# Patient Record
Sex: Male | Born: 1985 | Race: White | Hispanic: No | Marital: Married | Smoking: Former smoker
Health system: Southern US, Community
[De-identification: ages and names within clinical notes are randomized; demographics above are authoritative.]

## PROBLEM LIST (undated history)

## (undated) DIAGNOSIS — N400 Enlarged prostate without lower urinary tract symptoms: Secondary | ICD-10-CM

## (undated) DIAGNOSIS — G8929 Other chronic pain: Secondary | ICD-10-CM

## (undated) DIAGNOSIS — M51369 Other intervertebral disc degeneration, lumbar region without mention of lumbar back pain or lower extremity pain: Secondary | ICD-10-CM

## (undated) DIAGNOSIS — M1990 Unspecified osteoarthritis, unspecified site: Secondary | ICD-10-CM

## (undated) DIAGNOSIS — G709 Myoneural disorder, unspecified: Secondary | ICD-10-CM

## (undated) DIAGNOSIS — D869 Sarcoidosis, unspecified: Secondary | ICD-10-CM

## (undated) DIAGNOSIS — I10 Essential (primary) hypertension: Secondary | ICD-10-CM

## (undated) DIAGNOSIS — F419 Anxiety disorder, unspecified: Secondary | ICD-10-CM

## (undated) DIAGNOSIS — M4800 Spinal stenosis, site unspecified: Secondary | ICD-10-CM

## (undated) DIAGNOSIS — J189 Pneumonia, unspecified organism: Secondary | ICD-10-CM

## (undated) DIAGNOSIS — E785 Hyperlipidemia, unspecified: Secondary | ICD-10-CM

## (undated) DIAGNOSIS — F329 Major depressive disorder, single episode, unspecified: Secondary | ICD-10-CM

## (undated) DIAGNOSIS — M459 Ankylosing spondylitis of unspecified sites in spine: Secondary | ICD-10-CM

## (undated) DIAGNOSIS — F32A Depression, unspecified: Secondary | ICD-10-CM

## (undated) DIAGNOSIS — M5136 Other intervertebral disc degeneration, lumbar region: Secondary | ICD-10-CM

## (undated) DIAGNOSIS — M5030 Other cervical disc degeneration, unspecified cervical region: Secondary | ICD-10-CM

## (undated) DIAGNOSIS — G834 Cauda equina syndrome: Secondary | ICD-10-CM

## (undated) DIAGNOSIS — M961 Postlaminectomy syndrome, not elsewhere classified: Secondary | ICD-10-CM

## (undated) HISTORY — PX: SPINAL FUSION: SHX223

## (undated) HISTORY — DX: Myoneural disorder, unspecified: G70.9

## (undated) HISTORY — DX: Benign prostatic hyperplasia without lower urinary tract symptoms: N40.0

## (undated) HISTORY — PX: SPINE SURGERY: SHX786

## (undated) HISTORY — DX: Other intervertebral disc degeneration, lumbar region without mention of lumbar back pain or lower extremity pain: M51.369

## (undated) HISTORY — DX: Depression, unspecified: F32.A

## (undated) HISTORY — DX: Unspecified osteoarthritis, unspecified site: M19.90

## (undated) HISTORY — DX: Other cervical disc degeneration, unspecified cervical region: M50.30

## (undated) HISTORY — DX: Other intervertebral disc degeneration, lumbar region: M51.36

## (undated) HISTORY — DX: Essential (primary) hypertension: I10

## (undated) HISTORY — PX: IR FL GUIDED LOC OF NEEDLE/CATH TIP FOR SPINAL INJECTION LT: IMG2396

## (undated) HISTORY — DX: Other chronic pain: G89.29

## (undated) HISTORY — DX: Postlaminectomy syndrome, not elsewhere classified: M96.1

## (undated) HISTORY — DX: Sarcoidosis, unspecified: D86.9

## (undated) HISTORY — DX: Spinal stenosis, site unspecified: M48.00

## (undated) HISTORY — PX: CERVICAL FUSION: SHX112

## (undated) HISTORY — PX: SPINAL CORD STIMULATOR IMPLANT: SHX2422

## (undated) HISTORY — DX: Cauda equina syndrome: G83.4

## (undated) HISTORY — PX: LAMINECTOMY AND MICRODISCECTOMY LUMBAR SPINE: SHX1913

## (undated) HISTORY — DX: Hyperlipidemia, unspecified: E78.5

---
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
## 2016-10-07 DIAGNOSIS — M5020 Other cervical disc displacement, unspecified cervical region: Secondary | ICD-10-CM | POA: Insufficient documentation

## 2019-11-04 ENCOUNTER — Ambulatory Visit: Payer: Self-pay | Attending: Internal Medicine

## 2019-11-04 DIAGNOSIS — Z23 Encounter for immunization: Secondary | ICD-10-CM

## 2019-11-04 NOTE — Progress Notes (Signed)
   Covid-19 Vaccination Clinic  Name:  Lawrence Cantu    MRN: [id number] DOB: [birth year]  11/04/2019  [name] was observed post Covid-19 immunization for 15 minutes without incident. He was provided with Vaccine Information Sheet and instruction to access the V-Safe system.   [name] was instructed to call 911 with any severe reactions post vaccine: Marland Kitchen Difficulty breathing  . Swelling of face and throat  . A fast heartbeat  . A bad rash all over body  . Dizziness and weakness   Immunizations Administered    Name Date Dose VIS Date Route   Pfizer COVID-19 Vaccine 11/04/2019 12:53 PM 0.3 mL 09/01/2018 Intramuscular   Manufacturer: [employer], [employer]   Lot: [id number]   NDC: [address]-4

## 2019-11-29 ENCOUNTER — Ambulatory Visit: Payer: Medicare Other | Attending: Internal Medicine

## 2019-11-29 DIAGNOSIS — Z23 Encounter for immunization: Secondary | ICD-10-CM

## 2019-11-29 NOTE — Progress Notes (Signed)
   Covid-19 Vaccination Clinic  Name:  Lawrence Cantu    MRN: [id number] DOB: 1986-02-11  11/29/2019  Lawrence Cantu was observed post Covid-19 immunization for 15 minutes without incident. He was provided with Vaccine Information Sheet and instruction to access the V-Safe system.   Lawrence Cantu was instructed to call 911 with any severe reactions post vaccine: Marland Kitchen Difficulty breathing  . Swelling of face and throat  . A fast heartbeat  . A bad rash all over body  . Dizziness and weakness   Immunizations Administered    Name Date Dose VIS Date Route   Pfizer COVID-19 Vaccine 11/29/2019 12:58 PM 0.3 mL 09/01/2018 Intramuscular   Manufacturer: [employer], [employer]   Lot: [id number]   NDC: [address]-8

## 2019-12-07 ENCOUNTER — Encounter: Payer: Self-pay | Admitting: Internal Medicine

## 2019-12-07 ENCOUNTER — Telehealth (INDEPENDENT_AMBULATORY_CARE_PROVIDER_SITE_OTHER): Payer: Self-pay | Admitting: Internal Medicine

## 2019-12-07 DIAGNOSIS — F329 Major depressive disorder, single episode, unspecified: Secondary | ICD-10-CM

## 2019-12-07 DIAGNOSIS — Z7689 Persons encountering health services in other specified circumstances: Secondary | ICD-10-CM

## 2019-12-07 DIAGNOSIS — S343XXS Injury of cauda equina, sequela: Secondary | ICD-10-CM

## 2019-12-07 DIAGNOSIS — F32A Depression, unspecified: Secondary | ICD-10-CM

## 2019-12-07 MED ORDER — GABAPENTIN 600 MG PO TABS: 600.0000 mg | ORAL_TABLET | Freq: Three times a day (TID) | ORAL | 2 refills | Status: DC

## 2019-12-07 MED ORDER — ESCITALOPRAM OXALATE 20 MG PO TABS: 20.0000 mg | ORAL_TABLET | Freq: Every morning | ORAL | 2 refills | Status: DC

## 2019-12-07 MED ORDER — TAMSULOSIN HCL 0.4 MG PO CAPS: 0.4000 mg | ORAL_CAPSULE | Freq: Every day | ORAL | 2 refills | Status: DC

## 2019-12-07 NOTE — Progress Notes (Signed)
Virtual Visit via Telephone Note  I connected with Lawrence Cantu, on 12/07/2019 at 9:36 AM by telephone due to the COVID-19 pandemic and verified that I am speaking with the correct person using two identifiers.   Consent: I discussed the limitations, risks, security and privacy concerns of performing an evaluation and management service by telephone and the availability of in person appointments. I also discussed with the patient that there may be a patient responsible charge related to this service. The patient expressed understanding and agreed to proceed.   Location of Patient: Home   Location of Provider: Clinic    Persons participating in Telemedicine visit: Atlee Kluth [facility] Dr. Earlene Plater      History of Present Illness: Patient has a visit to establish care. He has a PMH of depression. He is on Lexapro. Was originally on Cymbalta for dual purpose of pain control as well but was having bad reactions.   He has a history of cauda equina syndrome. Lost all feeling in legs and use of bladder. He had emergency spinal surgery. Due to damage, he is on Gabapentin. Taking Flomax to assist with bladder functionality.   Depression screen PHQ 2/9 12/07/2019  Decreased Interest 1  Down, Depressed, Hopeless 0  PHQ - 2 Score 1  Altered sleeping 1  Tired, decreased energy 1  Change in appetite 0  Feeling bad or failure about yourself  0  Trouble concentrating 0  Moving slowly or fidgety/restless 0  Suicidal thoughts 0  PHQ-9 Score 3     Past Medical History:  Diagnosis Date  . Arthritis    Phreesia 12/05/2019  . Depression    Phreesia 12/05/2019  . Neuromuscular disorder (HCC)    Phreesia 12/05/2019   Allergies  Allergen Reactions  . Strawberry (Diagnostic) Anaphylaxis    Current Outpatient Medications on File Prior to Visit  Medication Sig Dispense Refill  . escitalopram (LEXAPRO) 20 MG tablet Take 20 mg by mouth in the morning.    . gabapentin (NEURONTIN) 600  MG tablet Take 600 mg by mouth 3 (three) times daily.    Marland Kitchen oxycodone (OXY-IR) 5 MG capsule Take 5 mg by mouth every 4 (four) hours as needed.    . tamsulosin (FLOMAX) 0.4 MG CAPS capsule Take 0.4 mg by mouth at bedtime.    . tizanidine (ZANAFLEX) 2 MG capsule Take 2 mg by mouth at bedtime.     No current facility-administered medications on file prior to visit.    Observations/Objective: NAD. Speaking clearly.  Work of breathing normal.  Alert and oriented. Mood appropriate.   Assessment and Plan: 1. Encounter to establish care Reviewed patient's PMH, social history, surgical history, and medications.  Is overdue for annual exam, screening blood work, and health maintenance topics. Have asked patient to return for visit to address these items.   2. Depression, unspecified depression type PHQ-9 shows depression well controlled. Patient has no concerns. No SI. Continue current therapy.  - escitalopram (LEXAPRO) 20 MG tablet; Take 1 tablet (20 mg total) by mouth in the morning.  Dispense: 30 tablet; Refill: 2  3. Cauda equina spinal cord injury, sequela On Oxycodone, referral placed to pain management.  - gabapentin (NEURONTIN) 600 MG tablet; Take 1 tablet (600 mg total) by mouth 3 (three) times daily.  Dispense: 90 tablet; Refill: 2 - tamsulosin (FLOMAX) 0.4 MG CAPS capsule; Take 1 capsule (0.4 mg total) by mouth at bedtime.  Dispense: 30 capsule; Refill: 2 - Ambulatory referral to Pain Clinic  Follow Up Instructions: Annual exam    I discussed the assessment and treatment plan with the patient. The patient was provided an opportunity to ask questions and all were answered. The patient agreed with the plan and demonstrated an understanding of the instructions.   The patient was advised to call back or seek an in-person evaluation if the symptoms worsen or if the condition fails to improve as anticipated.     I provided 15 minutes total of non-face-to-face time during this  encounter including median intraservice time, reviewing previous notes, investigations, ordering medications, medical decision making, coordinating care and patient verbalized understanding at the end of the visit.    Phill Myron, D.O. Primary Care at [facility]  12/07/2019, 9:36 AM

## 2019-12-09 ENCOUNTER — Emergency Department (HOSPITAL_COMMUNITY): Payer: Medicare Other

## 2019-12-09 ENCOUNTER — Emergency Department (HOSPITAL_COMMUNITY)
Admission: EM | Admit: 2019-12-09 | Discharge: 2019-12-09 | Disposition: A | Discharge status: Home / Self Care | Payer: Medicare Other | Attending: Emergency Medicine | Admitting: Emergency Medicine

## 2019-12-09 ENCOUNTER — Encounter (HOSPITAL_COMMUNITY): Payer: Self-pay

## 2019-12-09 DIAGNOSIS — R42 Dizziness and giddiness: Secondary | ICD-10-CM | POA: Insufficient documentation

## 2019-12-09 DIAGNOSIS — Z87891 Personal history of nicotine dependence: Secondary | ICD-10-CM | POA: Insufficient documentation

## 2019-12-09 DIAGNOSIS — M545 Low back pain, unspecified: Secondary | ICD-10-CM

## 2019-12-09 DIAGNOSIS — G8929 Other chronic pain: Secondary | ICD-10-CM | POA: Diagnosis not present

## 2019-12-09 DIAGNOSIS — Z79899 Other long term (current) drug therapy: Secondary | ICD-10-CM | POA: Insufficient documentation

## 2019-12-09 LAB — CBG MONITORING, ED: Glucose-Capillary: 101 mg/dL — ABNORMAL HIGH (ref 70–99)

## 2019-12-09 MED ORDER — GADOBUTROL 1 MMOL/ML IV SOLN
10.0000 mL | Freq: Once | INTRAVENOUS | Status: AC | PRN
  Administered 2019-12-09: 10 mL via INTRAVENOUS

## 2019-12-09 MED ORDER — OXYCODONE HCL 5 MG PO TABS: 5.0000 mg | ORAL_TABLET | Freq: Four times a day (QID) | ORAL | 0 refills | Status: DC | PRN

## 2019-12-09 MED ORDER — KETOROLAC TROMETHAMINE 30 MG/ML IJ SOLN
30.0000 mg | Freq: Once | INTRAMUSCULAR | Status: AC
  Administered 2019-12-09: 30 mg via INTRAMUSCULAR
  Filled 2019-12-09: qty 1

## 2019-12-09 MED ORDER — HYDROMORPHONE HCL 1 MG/ML IJ SOLN
1.0000 mg | Freq: Once | INTRAMUSCULAR | Status: AC
  Administered 2019-12-09: 1 mg via INTRAVENOUS
  Filled 2019-12-09: qty 1

## 2019-12-09 MED ORDER — OXYCODONE-ACETAMINOPHEN 5-325 MG PO TABS
1.0000 | ORAL_TABLET | Freq: Once | ORAL | Status: AC
  Administered 2019-12-09: 1 via ORAL
  Filled 2019-12-09: qty 1

## 2019-12-09 MED ORDER — HYDROMORPHONE HCL 1 MG/ML IJ SOLN
1.0000 mg | Freq: Once | INTRAMUSCULAR | Status: AC | PRN
  Administered 2019-12-09: 1 mg via INTRAVENOUS
  Filled 2019-12-09: qty 1

## 2019-12-09 MED ORDER — LORAZEPAM 2 MG/ML IJ SOLN
1.0000 mg | Freq: Once | INTRAMUSCULAR | Status: AC | PRN
  Administered 2019-12-09: 1 mg via INTRAVENOUS
  Filled 2019-12-09: qty 1

## 2019-12-09 NOTE — ED Provider Notes (Signed)
[facility] EMERGENCY DEPARTMENT Provider Note   CSN: [id number] Arrival date & time: 12/09/19  1233     History Chief Complaint  Patient presents with  . Back Pain    Lawrence Cantu is a 34 y.o. male.  HPI Patient with acute on chronic back pain.  Has had a previous cauda equina required surgery.  Has had somewhat continuous pain since and has a spinal stimulator.  States close to a week ago he was carrying a mattress cover up and it caught and he fell back landing on his rear end.  Since then severe back pain.  Has been worsening.  Now having difficulty walking.  States the pain is so severe when he stands he cannot walk.  He is already in pain management and has a spinal stimulator.  He is not out of any of his medicines.  While transferring from wheelchair to seat had a near syncopal episode.  States it was just pain.  States the pain gets severe if he tries to walk.  No weakness.  No loss of bladder or bowel control.  Walks with a cane at baseline when he has bad days.  States he does have a lot of bad days.  Recently moved to [place] from [place].  Is getting into see pain clinic at [facility] neurosurgery    Past Medical History:  Diagnosis Date  . Arthritis    Phreesia 12/05/2019  . Depression    Phreesia 12/05/2019  . Neuromuscular disorder ([place])    Phreesia 12/05/2019    There are no problems to display for this patient.   Past Surgical History:  Procedure Laterality Date  . SPINE SURGERY N/A    Phreesia 12/05/2019       Family History  Problem Relation Age of Onset  . Cancer Mother   . Depression Mother   . Cancer Father   . Depression Father   . Depression Sister   . Depression Brother   . Hypertension Maternal Grandmother   . Hyperlipidemia Maternal Grandmother   . Heart disease Maternal Grandmother     Social History   Tobacco Use  . Smoking status: Former Research scientist (life sciences)  . Smokeless tobacco: Never Used  Substance Use Topics  . Alcohol  use: Yes    Alcohol/week: 2.0 standard drinks    Types: 2 Standard drinks or equivalent per week  . Drug use: Not on file    Home Medications Prior to Admission medications   Medication Sig Start Date End Date Taking? Authorizing Provider  escitalopram (LEXAPRO) 20 MG tablet Take 1 tablet (20 mg total) by mouth in the morning. 12/07/19   Nicolette Bang, DO  gabapentin (NEURONTIN) 600 MG tablet Take 1 tablet (600 mg total) by mouth 3 (three) times daily. 12/07/19   Nicolette Bang, DO  oxycodone (OXY-IR) 5 MG capsule Take 5 mg by mouth every 4 (four) hours as needed.    [provider]  tamsulosin (FLOMAX) 0.4 MG CAPS capsule Take 1 capsule (0.4 mg total) by mouth at bedtime. 12/07/19   Nicolette Bang, DO  tizanidine (ZANAFLEX) 2 MG capsule Take 2 mg by mouth at bedtime.    [provider]    Allergies    Strawberry (diagnostic) and Other  Review of Systems   Review of Systems  Constitutional: Negative for appetite change.  HENT: Negative for congestion.   Respiratory: Negative for shortness of breath.   Gastrointestinal: Negative for abdominal pain.  Genitourinary: Negative for  flank pain.  Musculoskeletal: Positive for back pain.  Skin: Negative for pallor.  Neurological: Positive for light-headedness. Negative for weakness.  Psychiatric/Behavioral: Negative for confusion.    Physical Exam Updated Vital Signs BP 102/75   Pulse 91   Temp 98.6 F (37 C) (Oral)   Resp 17   SpO2 97%   Physical Exam Vitals and nursing note reviewed.  HENT:     Head: Atraumatic.  Eyes:     Extraocular Movements: Extraocular movements intact.  Cardiovascular:     Rate and Rhythm: Regular rhythm.  Pulmonary:     Breath sounds: No wheezing, rhonchi or rales.  Abdominal:     Tenderness: There is no abdominal tenderness.  Musculoskeletal:     Comments: No lumbar tenderness.  Right lower flank posterior spinal stimulator.  Midline scar from  previous lumbar surgery.  Skin:    General: Skin is warm.     Capillary Refill: Capillary refill takes less than 2 seconds.  Neurological:     Mental Status: He is alert.     Comments: Strength and sensation grossly intact bilateral lower extremities.  Able to flex and extend at ankles and knees.     ED Results / Procedures / Treatments   Labs (all labs ordered are listed, but only abnormal results are displayed) Labs Reviewed  CBG MONITORING, ED - Abnormal; Notable for the following components:      Result Value   Glucose-Capillary 101 (*)    All other components within normal limits    EKG None  Radiology No results found.  Procedures Procedures (including critical care time)  Medications Ordered in ED Medications  oxyCODONE-acetaminophen (PERCOCET/ROXICET) 5-325 MG per tablet 1 tablet (1 tablet Oral Given 12/09/19 1245)  ketorolac (TORADOL) 30 MG/ML injection 30 mg (30 mg Intramuscular Given 12/09/19 1451)  LORazepam (ATIVAN) injection 1 mg (1 mg Intravenous Given 12/09/19 1558)  HYDROmorphone (DILAUDID) injection 1 mg (1 mg Intravenous Given 12/09/19 1558)    ED Course  I have reviewed the triage vital signs and the nursing notes.  Pertinent labs & imaging results that were available during my care of the patient were reviewed by me and considered in my medical decision making (see chart for details).    MDM Rules/Calculators/A&P                     Patient with back pain.  Acute on chronic.  Recent fall.  Discussed with Dr. Lovell Sheehan from neurosurgery.  Will get MRI to further evaluate.  Patient has a spinal stimulator but should be able to be imaged as long as he turns it off.  If severe acute abnormality talk with Dr. Lovell Sheehan.  Otherwise follow-up as an outpatient.  Care turned over to Dr. Effie Shy.  Final Clinical Impression(s) / ED Diagnoses Final diagnoses:  Chronic low back pain, unspecified back pain laterality, unspecified whether sciatica present    Rx / DC  Orders ED Discharge Orders    None       Benjiman Core, MD 12/09/19 1616

## 2019-12-09 NOTE — ED Provider Notes (Signed)
4:35 PM-checkout from Dr. Rubin Payor to evaluate post MRI imaging, lumbar, to evaluate low back pain with mild radicular pain, following a fall, subacute.  7:00 PM-MRI results returned, show postoperative changes without acute injuries or other complications.  These findings were discussed with the patient.  He relates new onset pain since the fall 1 week ago, and is mostly bothered by ambulatory pain.  He is working on getting to see a pain doctor and has begun the process.  He is concerned that he will run out of his OxyIR, which he only has 10 days left.  I agreed to give him 1 week supply, to give him a little buffer to see the new doctor.  He was appreciative, and has no further complaints or requirements.   Mancel Bale, MD 12/09/19 1904

## 2019-12-09 NOTE — ED Notes (Signed)
When pt moved from wheelchair to stretcher chair, pt endorsed pain, sweating and dizziness. BP checked 77/54. Pt now feeling better and BP 102/75. MD made aware

## 2019-12-09 NOTE — ED Triage Notes (Signed)
Pt arrives to ED w/ c/o 10/10 L sided lumbar back pain radiating to the right side. Pt has hx of several back surgeries, including multiple fusions and a spinal cord stimulator. Pt was carrying box up the stairs when box got caught on the ceiling causing him to fall, back has been hurting ever since.

## 2019-12-09 NOTE — ED Notes (Signed)
Pt  Pre medicated for MRI and for pain when he turns his stimulator off

## 2019-12-09 NOTE — Discharge Instructions (Signed)
The MRI did not show any serious injuries or changes from your prior surgeries.  Follow-up with the pain management doctor soon as possible and your primary care doctor as needed for problems.

## 2019-12-09 NOTE — ED Notes (Signed)
All appropriate discharge materials reviewed at length with patient. Time for questions provided. Pt has no other questions at this time and verbalizes understanding of all provided materials.  

## 2019-12-10 ENCOUNTER — Encounter: Payer: Self-pay | Admitting: Internal Medicine

## 2019-12-16 ENCOUNTER — Encounter (HOSPITAL_COMMUNITY): Payer: Self-pay | Admitting: Emergency Medicine

## 2019-12-16 ENCOUNTER — Inpatient Hospital Stay (HOSPITAL_COMMUNITY)
Admit: 2019-12-16 | Discharge: 2019-12-21 | DRG: 552 | Disposition: A | Discharge status: Home / Self Care | Payer: Medicare Other | Attending: Internal Medicine | Admitting: Internal Medicine

## 2019-12-16 ENCOUNTER — Other Ambulatory Visit: Payer: Self-pay

## 2019-12-16 ENCOUNTER — Emergency Department (HOSPITAL_COMMUNITY): Payer: Medicare Other

## 2019-12-16 DIAGNOSIS — M5412 Radiculopathy, cervical region: Secondary | ICD-10-CM | POA: Diagnosis present

## 2019-12-16 DIAGNOSIS — R202 Paresthesia of skin: Secondary | ICD-10-CM

## 2019-12-16 DIAGNOSIS — M542 Cervicalgia: Secondary | ICD-10-CM

## 2019-12-16 DIAGNOSIS — N400 Enlarged prostate without lower urinary tract symptoms: Secondary | ICD-10-CM | POA: Diagnosis present

## 2019-12-16 DIAGNOSIS — Z91018 Allergy to other foods: Secondary | ICD-10-CM

## 2019-12-16 DIAGNOSIS — Z833 Family history of diabetes mellitus: Secondary | ICD-10-CM

## 2019-12-16 DIAGNOSIS — Z8249 Family history of ischemic heart disease and other diseases of the circulatory system: Secondary | ICD-10-CM

## 2019-12-16 DIAGNOSIS — E785 Hyperlipidemia, unspecified: Secondary | ICD-10-CM | POA: Diagnosis present

## 2019-12-16 DIAGNOSIS — I10 Essential (primary) hypertension: Secondary | ICD-10-CM | POA: Diagnosis present

## 2019-12-16 DIAGNOSIS — G8929 Other chronic pain: Secondary | ICD-10-CM | POA: Diagnosis present

## 2019-12-16 DIAGNOSIS — Z83438 Family history of other disorder of lipoprotein metabolism and other lipidemia: Secondary | ICD-10-CM

## 2019-12-16 DIAGNOSIS — S343XXS Injury of cauda equina, sequela: Secondary | ICD-10-CM

## 2019-12-16 DIAGNOSIS — Z79899 Other long term (current) drug therapy: Secondary | ICD-10-CM

## 2019-12-16 DIAGNOSIS — W109XXA Fall (on) (from) unspecified stairs and steps, initial encounter: Secondary | ICD-10-CM | POA: Diagnosis present

## 2019-12-16 DIAGNOSIS — Z87891 Personal history of nicotine dependence: Secondary | ICD-10-CM

## 2019-12-16 DIAGNOSIS — Z20822 Contact with and (suspected) exposure to covid-19: Secondary | ICD-10-CM | POA: Diagnosis present

## 2019-12-16 DIAGNOSIS — Z981 Arthrodesis status: Secondary | ICD-10-CM

## 2019-12-16 DIAGNOSIS — M4722 Other spondylosis with radiculopathy, cervical region: Principal | ICD-10-CM | POA: Diagnosis present

## 2019-12-16 DIAGNOSIS — Z808 Family history of malignant neoplasm of other organs or systems: Secondary | ICD-10-CM

## 2019-12-16 DIAGNOSIS — Z79890 Hormone replacement therapy (postmenopausal): Secondary | ICD-10-CM

## 2019-12-16 DIAGNOSIS — E669 Obesity, unspecified: Secondary | ICD-10-CM | POA: Diagnosis present

## 2019-12-16 DIAGNOSIS — G894 Chronic pain syndrome: Secondary | ICD-10-CM | POA: Diagnosis present

## 2019-12-16 DIAGNOSIS — E7800 Pure hypercholesterolemia, unspecified: Secondary | ICD-10-CM | POA: Diagnosis present

## 2019-12-16 DIAGNOSIS — Z9181 History of falling: Secondary | ICD-10-CM

## 2019-12-16 DIAGNOSIS — F329 Major depressive disorder, single episode, unspecified: Secondary | ICD-10-CM | POA: Diagnosis present

## 2019-12-16 DIAGNOSIS — Z9682 Presence of neurostimulator: Secondary | ICD-10-CM

## 2019-12-16 DIAGNOSIS — Z818 Family history of other mental and behavioral disorders: Secondary | ICD-10-CM

## 2019-12-16 DIAGNOSIS — F419 Anxiety disorder, unspecified: Secondary | ICD-10-CM | POA: Diagnosis present

## 2019-12-16 DIAGNOSIS — Z6831 Body mass index (BMI) 31.0-31.9, adult: Secondary | ICD-10-CM

## 2019-12-16 LAB — COMPREHENSIVE METABOLIC PANEL
ALT: 33 U/L (ref 0–44)
AST: 24 U/L (ref 15–41)
Albumin: 3.9 g/dL (ref 3.5–5.0)
Alkaline Phosphatase: 48 U/L (ref 38–126)
Anion gap: 9 (ref 5–15)
BUN: 19 mg/dL (ref 6–20)
CO2: 22 mmol/L (ref 22–32)
Calcium: 9.3 mg/dL (ref 8.9–10.3)
Chloride: 106 mmol/L (ref 98–111)
Creatinine, Ser: 0.92 mg/dL (ref 0.61–1.24)
GFR calc Af Amer: >60 mL/min (ref >60)
GFR calc non Af Amer: >60 mL/min (ref >60)
Glucose, Bld: 105 mg/dL — ABNORMAL HIGH (ref 70–99)
Potassium: 4.2 mmol/L (ref 3.5–5.1)
Sodium: 137 mmol/L (ref 135–145)
Total Bilirubin: 0.6 mg/dL (ref 0.3–1.2)
Total Protein: 6.7 g/dL (ref 6.5–8.1)

## 2019-12-16 LAB — CBC
HCT: 45.1 % (ref 39.0–52.0)
Hemoglobin: 15.2 g/dL (ref 13.0–17.0)
MCH: 28.9 pg (ref 26.0–34.0)
MCHC: 33.7 g/dL (ref 30.0–36.0)
MCV: 85.7 fL (ref 80.0–100.0)
Platelets: 268 10*3/uL (ref 150–400)
RBC: 5.26 MIL/uL (ref 4.22–5.81)
RDW: 12.8 % (ref 11.5–15.5)
WBC: 8 10*3/uL (ref 4.0–10.5)
nRBC: 0 % (ref 0.0–0.2)

## 2019-12-16 MED ORDER — HYDROMORPHONE HCL 1 MG/ML IJ SOLN
0.5000 mg | Freq: Once | INTRAMUSCULAR | Status: AC
  Administered 2019-12-16: 0.5 mg via INTRAVENOUS
  Filled 2019-12-16: qty 1

## 2019-12-16 MED ORDER — HYDROMORPHONE HCL 1 MG/ML IJ SOLN
1.0000 mg | Freq: Once | INTRAMUSCULAR | Status: AC
  Administered 2019-12-16: 1 mg via INTRAVENOUS
  Filled 2019-12-16: qty 1

## 2019-12-16 MED ORDER — ONDANSETRON HCL 4 MG/2ML IJ SOLN
4.0000 mg | Freq: Once | INTRAMUSCULAR | Status: AC
  Administered 2019-12-16: 4 mg via INTRAVENOUS
  Filled 2019-12-16: qty 2

## 2019-12-16 MED ORDER — LORAZEPAM 2 MG/ML IJ SOLN
1.0000 mg | Freq: Once | INTRAMUSCULAR | Status: AC | PRN
  Administered 2019-12-19: 1 mg via INTRAVENOUS
  Filled 2019-12-16 (×2): qty 1

## 2019-12-16 MED ORDER — KETOROLAC TROMETHAMINE 15 MG/ML IJ SOLN
15.0000 mg | Freq: Once | INTRAMUSCULAR | Status: AC
  Administered 2019-12-16: 15 mg via INTRAVENOUS
  Filled 2019-12-16: qty 1

## 2019-12-16 MED ORDER — METHOCARBAMOL 500 MG PO TABS
1000.0000 mg | ORAL_TABLET | Freq: Once | ORAL | Status: AC
  Administered 2019-12-16: 1000 mg via ORAL
  Filled 2019-12-16: qty 2

## 2019-12-16 MED ORDER — DEXAMETHASONE SODIUM PHOSPHATE 10 MG/ML IJ SOLN
10.0000 mg | Freq: Once | INTRAMUSCULAR | Status: AC
  Administered 2019-12-16: 10 mg via INTRAVENOUS
  Filled 2019-12-16: qty 1

## 2019-12-16 MED ORDER — ACETAMINOPHEN 500 MG PO TABS
1000.0000 mg | ORAL_TABLET | Freq: Once | ORAL | Status: AC
  Administered 2019-12-16: 1000 mg via ORAL
  Filled 2019-12-16: qty 2

## 2019-12-16 NOTE — Discharge Instructions (Addendum)
Please keep your appointment with neurosurgery on June 29.

## 2019-12-16 NOTE — ED Triage Notes (Signed)
Pt c/o neck pain that radiates down his back and his right arm, pain started when pt woke up this morning. Hx chronic neck and back pain, pt has a spinal stimulator, multiple surgeries.

## 2019-12-16 NOTE — ED Provider Notes (Signed)
[facility] EMERGENCY DEPARTMENT Provider Note   CSN: [id number] Arrival date & time: 12/16/19  6195     History Chief Complaint  Patient presents with  . Neck Pain    Lawrence Cantu is a 34 y.o. male.  Patient with significant past history of cervical and lumbar surgeries, history of spinal cord stimulator placement, recently moved from [place] to [place] and is in process of establishing care here --presents to the emergency department today for neck pain.  Patient had a fall and was seen in the emergency department for this on 12/09/2019.  He had a lumbar MRI which was reassuring.  Patient states that he woke up this morning with worsening pain in his neck and upper back.  He has some tingling in his bilateral fingers but no weakness.  He denies any changes in the lower extremity symptoms including weakness.  He is able to walk.  He takes oxycodone 5 mg tablets at home without improvement in his current symptoms.  He is also taken Zanaflex which does not help today.  Patient has a history of steroid injections in the cervical spine last performed early April 2021.  He did not get much relief from these.  Current plan to follow-up with [facility] neurosurgery.  He has established care with PCP.  Patient denies warning symptoms of back pain including: fecal incontinence, urinary retention or overflow incontinence, night sweats, waking from sleep with back pain, unexplained fevers or weight loss, h/o cancer, IVDU.     Medical and surgical history retrieved from care everywhere, pasted from pain management note in 10/2018:  PAST MEDICAL HISTORY has a past medical history of BPH (benign prostatic hyperplasia), Cervical herniated disc, Cervical pain, Cervical radicular pain, Chronic pain, DDD (degenerative disc disease), cervical, DDD (degenerative disc disease), lumbar, High cholesterol, Hyperlipidemia, Hypertension, Myofascial pain, Neck pain, Post laminectomy syndrome, Spasm of  back muscles, Spinal stenosis, Spondylosis of lumbar joint, and Urinary retention.   PAST SURGICAL HISTORY Past Surgical History:  Procedure Laterality Date  . BACK SURGERY  cervical fusion  . cervical epi dural 2018  x 2 no relief  . CERVICAL FUSION  . epidural steroid inj 2016  x 2 , lumbar  . LAMINECTOMY AND MICRODISCECTOMY LUMBAR SPINE 07/2013  . OTHER SURGICAL HISTORY  pt st had trial spinal cord stimulator approx 09-24-17 removed approx 5-6 days later pt st had some relleif  . spinal ablation 04/2015  . spinal cord stim  . SPINAL FUSION 07/2014           Past Medical History:  Diagnosis Date  . Arthritis    Phreesia 12/05/2019  . BPH (benign prostatic hyperplasia)   . Chronic pain   . DDD (degenerative disc disease), cervical   . DDD (degenerative disc disease), lumbar   . Depression    Phreesia 12/05/2019  . Hyperlipidemia   . Hypertension   . Neuromuscular disorder (HCC)    Phreesia 12/05/2019  . Post laminectomy syndrome   . Spinal stenosis     There are no problems to display for this patient.   Past Surgical History:  Procedure Laterality Date  . CERVICAL FUSION    . LAMINECTOMY AND MICRODISCECTOMY LUMBAR SPINE    . SPINAL FUSION    . SPINE SURGERY N/A    Phreesia 12/05/2019       Family History  Problem Relation Age of Onset  . Cancer Mother   . Depression Mother   . Depression Father   .  Thyroid cancer Father   . Depression Sister   . Depression Brother   . Hypertension Maternal Grandmother   . Hyperlipidemia Maternal Grandmother   . Heart disease Maternal Grandmother   . Atrial fibrillation Maternal Grandmother   . Diabetes Maternal Grandmother     Social History   Tobacco Use  . Smoking status: Former Games developer  . Smokeless tobacco: Never Used  Substance Use Topics  . Alcohol use: Yes    Alcohol/week: 2.0 standard drinks    Types: 2 Standard drinks or equivalent per week  . Drug use: Not on file    Home Medications Prior  to Admission medications   Medication Sig Start Date End Date Taking? Authorizing Provider  escitalopram (LEXAPRO) 20 MG tablet Take 1 tablet (20 mg total) by mouth in the morning. 12/07/19  Yes Arvilla Market, DO  gabapentin (NEURONTIN) 600 MG tablet Take 1 tablet (600 mg total) by mouth 3 (three) times daily. 12/07/19  Yes Arvilla Market, DO  ibuprofen (ADVIL) 200 MG tablet Take 800 mg by mouth daily as needed for moderate pain.    Yes [provider]  melatonin 5 MG TABS Take 5 mg by mouth at bedtime.   Yes [provider]  oxyCODONE (OXY IR/ROXICODONE) 5 MG immediate release tablet Take 1 tablet (5 mg total) by mouth every 6 (six) hours as needed for severe pain. 12/09/19  Yes Mancel Bale, MD  tamsulosin (FLOMAX) 0.4 MG CAPS capsule Take 1 capsule (0.4 mg total) by mouth at bedtime. 12/07/19  Yes Arvilla Market, DO  tizanidine (ZANAFLEX) 2 MG capsule Take 2 mg by mouth 3 (three) times daily as needed for muscle spasms.    Yes [provider]    Allergies    Strawberry (diagnostic) and Other  Review of Systems   Review of Systems  Constitutional: Negative for fever and unexpected weight change.  Gastrointestinal: Negative for constipation.       Neg for fecal incontinence  Genitourinary: Negative for difficulty urinating, flank pain and hematuria.       Negative for urinary incontinence or retention  Musculoskeletal: Positive for back pain and neck pain.  Neurological: Positive for numbness (paresthesias). Negative for weakness.       Negative for saddle paresthesias     Physical Exam Updated Vital Signs BP 131/88   Pulse (!) 102   Temp 98.9 F (37.2 C) (Oral)   Resp 20   Ht 5\' 11"  (1.803 m)   Wt 102.1 kg   SpO2 99%   BMI 31.38 kg/m   Physical Exam Vitals and nursing note reviewed.  Constitutional:      Appearance: He is well-developed.  HENT:     Head: Normocephalic and atraumatic.  Eyes:     Conjunctiva/sclera:  Conjunctivae normal.  Abdominal:     Palpations: Abdomen is soft.     Tenderness: There is no abdominal tenderness. There is no guarding.  Musculoskeletal:     Right shoulder: No tenderness.     Left shoulder: No tenderness.     Cervical back: Spasms and tenderness present. No bony tenderness. Decreased range of motion.     Thoracic back: Tenderness (upper) present.     Comments: No step-off noted with palpation of spine.   Skin:    General: Skin is warm and dry.  Neurological:     Mental Status: He is alert.     Sensory: No sensory deficit.     Motor: No abnormal muscle tone.  Deep Tendon Reflexes: Reflexes are normal and symmetric.     Comments: Upper extremity myotomes tested bilaterally:  C5 Shoulder abduction 5/5 C6 Elbow flexion/wrist extension 5/5 C7 Elbow extension 5/5 C8 Finger flexion 5/5 T1 Finger abduction 5/5  Lower extremity myotomes tested bilaterally: L4 Ankle dorsiflexion 5/5 S1 Ankle plantar flexion 5/5      ED Results / Procedures / Treatments   Labs (all labs ordered are listed, but only abnormal results are displayed) Labs Reviewed  COMPREHENSIVE METABOLIC PANEL - Abnormal; Notable for the following components:      Result Value   Glucose, Bld 105 (*)    All other components within normal limits  CBC    EKG None  Radiology DG Cervical Spine Complete  Result Date: 12/16/2019 CLINICAL DATA:  34 year old male with neck pain and muscle stiffness. EXAM: CERVICAL SPINE - COMPLETE 4+ VIEW COMPARISON:  None. FINDINGS: On the lateral view there appears to be congenital incomplete segmentation of C2-C3 plus superimposed prior C3-C4 ACDF. It is unclear whether there is C3-C4 arthrodesis. Relatively preserved disc spaces elsewhere but bulky endplate spurring at C4-C5. C1-C2 alignment appears to remain normal. Normal prevertebral soft tissue contour. Bilateral posterior element alignment is within normal limits. Cervicothoracic junction alignment is within  normal limits. No acute osseous abnormality identified. Negative visible upper chest. IMPRESSION: 1. 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 2. Bulky endplate degeneration at the adjacent C4-C5 segment. 3. No acute osseous abnormality identified in the cervical spine. Electronically Signed   By: Odessa Fleming M.D.   On: 12/16/2019 21:32    Procedures Procedures (including critical care time)  Medications Ordered in ED Medications  LORazepam (ATIVAN) injection 1 mg (has no administration in time range)  HYDROmorphone (DILAUDID) injection 1 mg (1 mg Intravenous Given 12/16/19 2042)  ondansetron (ZOFRAN) injection 4 mg (4 mg Intravenous Given 12/16/19 2042)  dexamethasone (DECADRON) injection 10 mg (10 mg Intravenous Given 12/16/19 2042)  methocarbamol (ROBAXIN) tablet 1,000 mg (1,000 mg Oral Given 12/16/19 2235)  acetaminophen (TYLENOL) tablet 1,000 mg (1,000 mg Oral Given 12/16/19 2235)  ketorolac (TORADOL) 15 MG/ML injection 15 mg (15 mg Intravenous Given 12/16/19 2235)  HYDROmorphone (DILAUDID) injection 0.5 mg (0.5 mg Intravenous Given 12/16/19 2235)    ED Course  I have reviewed the triage vital signs and the nursing notes.  Pertinent labs & imaging results that were available during my care of the patient were reviewed by me and considered in my medical decision making (see chart for details).  Patient seen and examined.  Reviewed previous/recent notes in care everywhere work-up initiated.  Reviewed recent emergency department visit and MRI results.  Medications ordered.  Will perform cervical spine plain films.  Vital signs reviewed and are as follows: BP 131/88   Pulse (!) 102   Temp 98.9 F (37.2 C) (Oral)   Resp 20   Ht 5\' 11"  (1.803 m)   Wt 102.1 kg   SpO2 99%   BMI 31.38 kg/m   Pt discussed with and seen by Dr. . MRI ordered.  Additional medications for pain as well.  12:12 AM signout to Joy PA-C at shift change.  Plan: Follow-up on MRI of  cervical spine.  Address any acute findings.  If no acute findings, discharged home with continued plan to follow-up with pain management and neurosurgery.    MDM Rules/Calculators/A&P  Patient with acute on chronic neck pain.  Pending completion of evaluation.    Final Clinical Impression(s) / ED Diagnoses Final diagnoses:  None    Rx / DC Orders ED Discharge Orders    None       Carlisle Cater, PA-C 12/17/19 0013    Maudie Flakes, MD 12/22/19 [phone]

## 2019-12-16 NOTE — ED Provider Notes (Signed)
Lawrence Cantu is a 34 y.o. male, presenting to the ED with increased neck pain over the last week, but worse upon waking this morning.  He states his pain worsened since a fall he endured a little over a week ago.   HPI from [place], PA-C: "Patient with significant past history of cervical and lumbar surgeries, history of spinal cord stimulator placement, recently moved from [place] to [place] and is in process of establishing care here --presents to the emergency department today for neck pain.  Patient had a fall and was seen in the emergency department for this on 12/09/2019.  He had a lumbar MRI which was reassuring.  Patient states that he woke up this morning with worsening pain in his neck and upper back.  He has some tingling in his bilateral fingers but no weakness.  He denies any changes in the lower extremity symptoms including weakness.  He is able to walk.  He takes oxycodone 5 mg tablets at home without improvement in his current symptoms.  He is also taken Zanaflex which does not help today.  Patient has a history of steroid injections in the cervical spine last performed early April 2021.  He did not get much relief from these.  Current plan to follow-up with [facility].  He has established care with PCP.  Patient denies warning symptoms of back pain including: fecal incontinence, urinary retention or overflow incontinence, night sweats, waking from sleep with back pain, unexplained fevers or weight loss, h/o cancer, IVDU."  Past Medical History:  Diagnosis Date  . Arthritis    Phreesia 12/05/2019  . BPH (benign prostatic hyperplasia)   . Chronic pain   . DDD (degenerative disc disease), cervical   . DDD (degenerative disc disease), lumbar   . Depression    Phreesia 12/05/2019  . Hyperlipidemia   . Hypertension   . Neuromuscular disorder (HCC)    Phreesia 12/05/2019  . Post laminectomy syndrome   . Spinal stenosis     Physical Exam  BP 123/81   Pulse 74    Temp 98.9 F (37.2 C) (Oral)   Resp 20   Ht 5\' 11"  (1.803 m)   Wt 102.1 kg   SpO2 95%   BMI 31.38 kg/m   Physical Exam Vitals and nursing note reviewed.  Constitutional:      General: He is not in acute distress.    Appearance: He is well-developed. He is not diaphoretic.  HENT:     Head: Normocephalic and atraumatic.  Eyes:     Conjunctiva/sclera: Conjunctivae normal.  Cardiovascular:     Rate and Rhythm: Normal rate and regular rhythm.     Pulses:          Radial pulses are 2+ on the right side and 2+ on the left side.  Pulmonary:     Effort: Pulmonary effort is normal.  Musculoskeletal:     Cervical back: Neck supple.     Comments: Tenderness without step-off, deformity, swelling, or other abnormality noted to the midline cervical spine.  Skin:    General: Skin is warm and dry.     Capillary Refill: Capillary refill takes less than 2 seconds.     Coloration: Skin is not pale.  Neurological:     Mental Status: He is alert and oriented to person, place, and time.     Comments: No noted acute cognitive deficit. Sensation grossly intact to light touch in the extremities.   Grip strengths equal bilaterally.  Strength 5/5 in all extremities.  Cranial nerves III-XII grossly intact.  Handles oral secretions without noted difficulty.  No noted phonation or speech deficit. No facial droop.   Psychiatric:        Behavior: Behavior normal.     ED Course/Procedures    Procedures   Abnormal Labs Reviewed  COMPREHENSIVE METABOLIC PANEL - Abnormal; Notable for the following components:      Result Value   Glucose, Bld 105 (*)    All other components within normal limits   DG Cervical Spine Complete  Result Date: 12/16/2019 CLINICAL DATA:  34 year old male with neck pain and muscle stiffness. EXAM: CERVICAL SPINE - COMPLETE 4+ VIEW COMPARISON:  None. FINDINGS: On the lateral view there appears to be congenital incomplete segmentation of C2-C3 plus superimposed prior C3-C4  ACDF. It is unclear whether there is C3-C4 arthrodesis. Relatively preserved disc spaces elsewhere but bulky endplate spurring at C4-C5. C1-C2 alignment appears to remain normal. Normal prevertebral soft tissue contour. Bilateral posterior element alignment is within normal limits. Cervicothoracic junction alignment is within normal limits. No acute osseous abnormality identified. Negative visible upper chest. IMPRESSION: 1. 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 2. Bulky endplate degeneration at the adjacent C4-C5 segment. 3. No acute osseous abnormality identified in the cervical spine. Electronically Signed   By: Odessa Fleming M.D.   On: 12/16/2019 21:32   CT Cervical Spine Wo Contrast  Result Date: 12/17/2019 CLINICAL DATA:  Fall down stairs a few days ago with neck pain. EXAM: CT CERVICAL SPINE WITHOUT CONTRAST TECHNIQUE: Multidetector CT imaging of the cervical spine was performed without intravenous contrast. Multiplanar CT image reconstructions were also generated. COMPARISON:  Radiography from yesterday FINDINGS: Alignment: Lateral atlantodental asymmetry, wider on the right, but no subluxation or underlying fracture. Retro dental ligamentous complex is still visible. No predental widening. Skull base and vertebrae: C2-3 non segmentation and C3-4 ACDF with solid arthrodesis. No acute fracture. Soft tissues and spinal canal: Negative Disc levels: C5-6 ventral spondylitic spurring. No visible impingement Upper chest: Negative IMPRESSION: 1. No acute fracture. 2. Lateral atlantodental asymmetry without underlying traumatic finding, possibly developmental. There is a C2-3 non segmentation. 3. C3-4 ACDF with solid arthrodesis. Electronically Signed   By: Marnee Spring M.D.   On: 12/17/2019 06:15   MR Lumbar Spine W Wo Contrast  Result Date: 12/09/2019 CLINICAL DATA:  Low back pain, spinal stimulator, history of surgery EXAM: MRI LUMBAR SPINE WITHOUT AND WITH CONTRAST  TECHNIQUE: Multiplanar and multiecho pulse sequences of the lumbar spine were obtained without and with intravenous contrast. CONTRAST:  41mL GADAVIST GADOBUTROL 1 MMOL/ML IV SOLN COMPARISON:  None. FINDINGS: Axial T2 sequence could not be obtained due to technical limitations related to stimulator. Segmentation:  Standard. Alignment:  Anteroposterior alignment is maintained. Vertebrae: Vertebral body heights are preserved. There are postoperative changes of decompression and fusion at L4-L5 with pedicle screws and interbody spacer. The hardware is not well evaluated on this study and there is associated susceptibility artifact. Conus medullaris and cauda equina: Conus extends to the T12-L1 level. Conus and cauda equina appear normal. Paraspinal and other soft tissues: Stimulator is seen entering the spinal canal at approximately the L1 level and extending cranially out of field of view Disc levels: L1-L2:  No significant canal or foraminal stenosis. L2-L3:  No significant canal or foraminal stenosis. L3-L4: Mild facet arthropathy ligamentum. Congenital narrowing of the canal. No significant degenerative stenosis. L4-L5: Operative level. Canal has been decompressed. Ill-defined epidural soft tissue  is likely postoperative. Small endplate osteophytes are present. There is facet arthropathy. No significant foraminal stenosis. L5-S1: Operative level. Canal has been decompressed. There is prominent ventral epidural fat effacing the thecal sac. Mild facet arthropathy. No significant foraminal stenosis IMPRESSION: Postoperative and degenerative changes as detailed above. No high-grade stenosis. Electronically Signed   By: Macy Mis M.D.   On: 12/09/2019 17:58    MDM   Clinical Course as of Dec 16 641  Fri Dec 17, 2019  0614 Spoke with Candace, MRI tech.  She states because patient has a spinal cord stimulator in place, he would need to have MRI on specific machine.  The specific machine is not currently  operating.  They are actively working on the machine and are hoping to have it up and running today.   [SJ]    Clinical Course User Index [SJ] Lorayne Bender, PA-C    Patient care handoff report received from [facility], [place]. Plan: MR cervical spine.  Disposition according to results.   Patient was treated with IV analgesia during his ED course. Near the end of my shift, I was notified that MRI was down and they did not know when the machine would again be functioning. At this point, I needed to perform more assessment on the patient's cervical spine in regards to his recent trauma.  This would have been adequately assessed with the MRI, however, since this was no longer possible at the present time, I opted to add in a CT of the cervical spine. CT cervical spine showed no acute abnormality.  Due to the level of the patient's pain and new paresthesias bilaterally, I do think he would benefit from MRI before leaving.  He has required multiple doses of IV analgesia.  End of shift patient care handoff report given to [employer], PA-C. Plan: Plan to admit the patient for pain control until MRI can be obtained.  Vitals:   12/16/19 2302 12/16/19 2303 12/16/19 2304 12/16/19 2305  BP:      Pulse: 81 78 76 74  Resp:      Temp:      TempSrc:      SpO2: 94% 95% 96% 95%  Weight:      Height:        Vitals:   12/16/19 2305 12/17/19 0100 12/17/19 0245 12/17/19 0300  BP:  119/79 131/82 134/85  Pulse: 74 77 86 89  Resp:    16  Temp:      TempSrc:      SpO2: 95% 93% 94% 93%  Weight:      Height:           Layla Maw 12/17/19 0713    Mesner, Corene Cornea, MD 12/17/19 [phone]

## 2019-12-17 ENCOUNTER — Inpatient Hospital Stay (HOSPITAL_COMMUNITY): Payer: Medicare Other

## 2019-12-17 ENCOUNTER — Emergency Department (HOSPITAL_COMMUNITY): Payer: Medicare Other

## 2019-12-17 DIAGNOSIS — M542 Cervicalgia: Secondary | ICD-10-CM | POA: Diagnosis present

## 2019-12-17 DIAGNOSIS — N400 Enlarged prostate without lower urinary tract symptoms: Secondary | ICD-10-CM

## 2019-12-17 DIAGNOSIS — M4722 Other spondylosis with radiculopathy, cervical region: Secondary | ICD-10-CM | POA: Diagnosis present

## 2019-12-17 DIAGNOSIS — E669 Obesity, unspecified: Secondary | ICD-10-CM | POA: Diagnosis present

## 2019-12-17 DIAGNOSIS — W109XXA Fall (on) (from) unspecified stairs and steps, initial encounter: Secondary | ICD-10-CM | POA: Diagnosis present

## 2019-12-17 DIAGNOSIS — Z833 Family history of diabetes mellitus: Secondary | ICD-10-CM | POA: Diagnosis not present

## 2019-12-17 DIAGNOSIS — G894 Chronic pain syndrome: Secondary | ICD-10-CM | POA: Diagnosis not present

## 2019-12-17 DIAGNOSIS — Z9682 Presence of neurostimulator: Secondary | ICD-10-CM | POA: Diagnosis not present

## 2019-12-17 DIAGNOSIS — Z8249 Family history of ischemic heart disease and other diseases of the circulatory system: Secondary | ICD-10-CM | POA: Diagnosis not present

## 2019-12-17 DIAGNOSIS — I10 Essential (primary) hypertension: Secondary | ICD-10-CM | POA: Diagnosis present

## 2019-12-17 DIAGNOSIS — Z9181 History of falling: Secondary | ICD-10-CM

## 2019-12-17 DIAGNOSIS — M5412 Radiculopathy, cervical region: Secondary | ICD-10-CM | POA: Diagnosis not present

## 2019-12-17 DIAGNOSIS — G8929 Other chronic pain: Secondary | ICD-10-CM | POA: Diagnosis present

## 2019-12-17 DIAGNOSIS — Z818 Family history of other mental and behavioral disorders: Secondary | ICD-10-CM | POA: Diagnosis not present

## 2019-12-17 DIAGNOSIS — Z87891 Personal history of nicotine dependence: Secondary | ICD-10-CM | POA: Diagnosis not present

## 2019-12-17 DIAGNOSIS — Z981 Arthrodesis status: Secondary | ICD-10-CM | POA: Diagnosis not present

## 2019-12-17 DIAGNOSIS — Z79890 Hormone replacement therapy (postmenopausal): Secondary | ICD-10-CM | POA: Diagnosis not present

## 2019-12-17 DIAGNOSIS — F329 Major depressive disorder, single episode, unspecified: Secondary | ICD-10-CM | POA: Diagnosis present

## 2019-12-17 DIAGNOSIS — Z20822 Contact with and (suspected) exposure to covid-19: Secondary | ICD-10-CM | POA: Diagnosis present

## 2019-12-17 DIAGNOSIS — F419 Anxiety disorder, unspecified: Secondary | ICD-10-CM | POA: Diagnosis present

## 2019-12-17 DIAGNOSIS — E785 Hyperlipidemia, unspecified: Secondary | ICD-10-CM | POA: Diagnosis present

## 2019-12-17 DIAGNOSIS — Z79899 Other long term (current) drug therapy: Secondary | ICD-10-CM | POA: Diagnosis not present

## 2019-12-17 DIAGNOSIS — Z6831 Body mass index (BMI) 31.0-31.9, adult: Secondary | ICD-10-CM | POA: Diagnosis not present

## 2019-12-17 DIAGNOSIS — Z83438 Family history of other disorder of lipoprotein metabolism and other lipidemia: Secondary | ICD-10-CM | POA: Diagnosis not present

## 2019-12-17 DIAGNOSIS — Z91018 Allergy to other foods: Secondary | ICD-10-CM | POA: Diagnosis not present

## 2019-12-17 DIAGNOSIS — Z808 Family history of malignant neoplasm of other organs or systems: Secondary | ICD-10-CM | POA: Diagnosis not present

## 2019-12-17 DIAGNOSIS — E7800 Pure hypercholesterolemia, unspecified: Secondary | ICD-10-CM | POA: Diagnosis present

## 2019-12-17 MED ORDER — HYDROMORPHONE HCL 1 MG/ML IJ SOLN
1.0000 mg | Freq: Once | INTRAMUSCULAR | Status: AC
  Administered 2019-12-17: 1 mg via INTRAVENOUS
  Filled 2019-12-17: qty 1

## 2019-12-17 MED ORDER — ACETAMINOPHEN 650 MG RE SUPP: 650.0000 mg | Freq: Four times a day (QID) | RECTAL | Status: DC | PRN

## 2019-12-17 MED ORDER — ACETAMINOPHEN 325 MG PO TABS
650.0000 mg | ORAL_TABLET | Freq: Four times a day (QID) | ORAL | Status: DC | PRN
  Administered 2019-12-18 – 2019-12-20 (×2): 650 mg via ORAL
  Filled 2019-12-17 (×2): qty 2

## 2019-12-17 MED ORDER — HYDROMORPHONE HCL 1 MG/ML IJ SOLN
0.5000 mg | Freq: Four times a day (QID) | INTRAMUSCULAR | Status: DC | PRN
  Administered 2019-12-17 – 2019-12-21 (×11): 0.5 mg via INTRAVENOUS
  Filled 2019-12-17 (×12): qty 1

## 2019-12-17 MED ORDER — OXYCODONE HCL 5 MG PO TABS
5.0000 mg | ORAL_TABLET | Freq: Four times a day (QID) | ORAL | Status: DC | PRN
  Administered 2019-12-17 – 2019-12-21 (×14): 5 mg via ORAL
  Filled 2019-12-17 (×14): qty 1

## 2019-12-17 MED ORDER — TAMSULOSIN HCL 0.4 MG PO CAPS
0.4000 mg | ORAL_CAPSULE | Freq: Every day | ORAL | Status: DC
  Administered 2019-12-17 – 2019-12-20 (×4): 0.4 mg via ORAL
  Filled 2019-12-17 (×4): qty 1

## 2019-12-17 MED ORDER — ALBUTEROL SULFATE (2.5 MG/3ML) 0.083% IN NEBU: 2.5000 mg | INHALATION_SOLUTION | Freq: Four times a day (QID) | RESPIRATORY_TRACT | Status: DC | PRN

## 2019-12-17 MED ORDER — HYDROMORPHONE HCL 1 MG/ML IJ SOLN
1.0000 mg | INTRAMUSCULAR | Status: DC | PRN
  Administered 2019-12-17: 1 mg via INTRAVENOUS
  Filled 2019-12-17: qty 1

## 2019-12-17 MED ORDER — ONDANSETRON HCL 4 MG/2ML IJ SOLN: 4.0000 mg | Freq: Four times a day (QID) | INTRAMUSCULAR | Status: DC | PRN

## 2019-12-17 MED ORDER — TIZANIDINE HCL 2 MG PO TABS
2.0000 mg | ORAL_TABLET | Freq: Three times a day (TID) | ORAL | Status: DC | PRN
  Administered 2019-12-17 – 2019-12-21 (×7): 2 mg via ORAL
  Filled 2019-12-17 (×10): qty 1

## 2019-12-17 MED ORDER — GABAPENTIN 600 MG PO TABS
600.0000 mg | ORAL_TABLET | Freq: Three times a day (TID) | ORAL | Status: DC
  Filled 2019-12-17: qty 1

## 2019-12-17 MED ORDER — HYDROMORPHONE HCL 1 MG/ML IJ SOLN: 1.0000 mg | INTRAMUSCULAR | Status: DC | PRN

## 2019-12-17 MED ORDER — ESCITALOPRAM OXALATE 10 MG PO TABS
20.0000 mg | ORAL_TABLET | Freq: Every morning | ORAL | Status: DC
  Administered 2019-12-17 – 2019-12-21 (×5): 20 mg via ORAL
  Filled 2019-12-17 (×5): qty 2

## 2019-12-17 MED ORDER — KETOROLAC TROMETHAMINE 30 MG/ML IJ SOLN
30.0000 mg | Freq: Four times a day (QID) | INTRAMUSCULAR | Status: DC | PRN
  Administered 2019-12-18 – 2019-12-21 (×5): 30 mg via INTRAVENOUS
  Filled 2019-12-17 (×5): qty 1

## 2019-12-17 MED ORDER — SODIUM CHLORIDE 0.9% FLUSH
3.0000 mL | Freq: Two times a day (BID) | INTRAVENOUS | Status: DC
  Administered 2019-12-17 – 2019-12-21 (×8): 3 mL via INTRAVENOUS

## 2019-12-17 MED ORDER — MELATONIN 5 MG PO TABS
5.0000 mg | ORAL_TABLET | Freq: Every day | ORAL | Status: DC
  Administered 2019-12-17 – 2019-12-20 (×4): 5 mg via ORAL
  Filled 2019-12-17 (×4): qty 1

## 2019-12-17 MED ORDER — ENOXAPARIN SODIUM 40 MG/0.4ML ~~LOC~~ SOLN
40.0000 mg | SUBCUTANEOUS | Status: DC
  Administered 2019-12-17 – 2019-12-21 (×5): 40 mg via SUBCUTANEOUS
  Filled 2019-12-17 (×5): qty 0.4

## 2019-12-17 MED ORDER — ONDANSETRON HCL 4 MG PO TABS: 4.0000 mg | ORAL_TABLET | Freq: Four times a day (QID) | ORAL | Status: DC | PRN

## 2019-12-17 MED ORDER — GABAPENTIN 300 MG PO CAPS
600.0000 mg | ORAL_CAPSULE | Freq: Three times a day (TID) | ORAL | Status: DC
  Administered 2019-12-17 (×3): 600 mg via ORAL
  Filled 2019-12-17 (×3): qty 2

## 2019-12-17 NOTE — Consult Note (Signed)
Subjective:   Patient is a 34 y.o. 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  He chronically has had neck pain and was seeing a pain management doctor in [place], who performed injections and ablations, but eventually said there was no other procedural treatments he could offer.  He was on a regimen of gabapentin, tizanidine with roxicodone as needed for breakthrough.  He recently came to the hospital for worse low back pain but this slowly improved.  He complains of 6 weeks of neck pain, worse with turning his head.  It sometimes extends down his shoulders to his arms, and he occasionally has hand numbness bilaterally.  He now has a hard time walking due to the neck pain.  He has been taking his oxycodone every 4 hours now. His initial C2-3 ACDF was done for posterior head/scalp pain.      Patient Active Problem List   Diagnosis Date Noted   Neck pain 12/17/2019   Past Medical History:  Diagnosis Date   Arthritis    Phreesia 12/05/2019   BPH (benign prostatic hyperplasia)    Chronic pain    DDD (degenerative disc disease), cervical    DDD (degenerative disc disease), lumbar    Depression    Phreesia 12/05/2019   Hyperlipidemia    Hypertension    Neuromuscular disorder (HCC)    Phreesia 12/05/2019   Post laminectomy syndrome    Spinal stenosis     Past Surgical History:  Procedure Laterality Date   CERVICAL FUSION     LAMINECTOMY AND MICRODISCECTOMY LUMBAR SPINE     SPINAL FUSION     SPINE SURGERY N/A    Phreesia 12/05/2019    Medications Prior to Admission  Medication Sig Dispense Refill Last Dose   escitalopram (LEXAPRO) 20 MG tablet Take 1 tablet (20 mg total) by mouth in the morning. 30 tablet 2 12/16/2019 at Unknown time   gabapentin (NEURONTIN) 600 MG tablet Take 1 tablet (600 mg total) by mouth 3 (three) times daily. 90 tablet 2 12/16/2019 at Unknown time   ibuprofen  (ADVIL) 200 MG tablet Take 800 mg by mouth daily as needed for moderate pain.    12/16/2019 at Unknown time   melatonin 5 MG TABS Take 5 mg by mouth at bedtime.   12/15/2019 at Unknown time   oxyCODONE (OXY IR/ROXICODONE) 5 MG immediate release tablet Take 1 tablet (5 mg total) by mouth every 6 (six) hours as needed for severe pain. 24 tablet 0 12/16/2019 at Unknown time   tamsulosin (FLOMAX) 0.4 MG CAPS capsule Take 1 capsule (0.4 mg total) by mouth at bedtime. 30 capsule 2 12/15/2019 at Unknown time   tizanidine (ZANAFLEX) 2 MG capsule Take 2 mg by mouth 3 (three) times daily as needed for muscle spasms.    12/16/2019 at Unknown time   Allergies  Allergen Reactions   Strawberry (Diagnostic) Anaphylaxis   Other Hives    Cat Dander    Social History   Tobacco Use   Smoking status: Former Smoker   Smokeless tobacco: Never Used  Substance Use Topics   Alcohol use: Yes    Alcohol/week: 2.0 standard drinks    Types: 2 Standard drinks or equivalent per week    Family History  Problem Relation Age of Onset   Cancer Mother    Depression Mother    Depression Father    Thyroid cancer Father  Depression Sister    Depression Brother    Hypertension Maternal Grandmother    Hyperlipidemia Maternal Grandmother    Heart disease Maternal Grandmother    Atrial fibrillation Maternal Grandmother    Diabetes Maternal Grandmother     Review of Systems 10 of 14 systems reviewed  Objective:   Patient Vitals for the past 8 hrs:  BP Temp Temp src Pulse Resp SpO2  12/17/19 1217 116/68 97.6 F (36.4 C) Oral 74 17 98 %  12/17/19 1200 119/71 99.1 F (37.3 C) Oral 87 16 95 %   No intake/output data recorded. No intake/output data recorded.    Awake, alert, flat affect. Ox3. 5/5 strength D, B, T, FExt, HG, 4+ L Wext.  5/5 LEs  CT C-spine and MRI C-spine was reviewed.  Well-healed fusion at C2-3.  Loss of lordosis in cervical spine.  Mild cervical spondylosis and disc disease  seen.  No significant neural element impingement.  No malalignment.  Assessment:   Active Problems:   Neck pain  34 y.o. 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  Plan:  - I do not recommend any surgical intervention on his spine, and no neurosurgical follow-up is needed. - I explained that surgery is counterproductive in these situations of amplified pain syndromes. - will need medical treatment of his pain flare-up-- can increase Gabapentin dose, start dexamethasone taper, give IV Toradol while in hospital, start an NSAID to his pain regimen.  Can consider adding duloxetine if has not been tried before. - patient has f/u with pain management at [facility] on 01/04/20, as he is transitioning care here since moving from [place].   All questions and concerns were answered.  Patient and his wife verbalized understanding and agreement.  I spent 22 minutes in patient counseling.

## 2019-12-17 NOTE — H&P (Addendum)
History and Physical    Greco Gastelum XHB:[id number] DOB: 11-29-1985 DOA: 12/16/2019  Referring MD/NP/PA: Tanda Rockers, PA-C PCP: Arvilla Market, DO  Patient coming from:  home  Chief Complaint: Neck pain  I have personally briefly reviewed patient's old medical records in [facility]   HPI: Lawrence Cantu is a 34 y.o. male with medical history significant of hypertension, hyperlipidemia, BPH, DDD, chronic pain s/p spinal cord stimulator, spinal stenosis, postlaminectomy syndrome, history of cauda equina syndrome, neck pain, and prior back and neck surgeries who presents with complaints of severe neck pain.  He reports falling down 3 steps a little bit over a week ago.  Patient denies any loss of consciousness.  He had been seen in the emergency department on 6/3 with complaints of back pain, and was evaluated with a lumbar MRI which did not note any acute abnormality.  Normally patient walks with use of a cane, but has been having trouble doing that due to his symptoms.  Yesterday morning he woke up with severe neck pain with sharp pains running down both triceps into his hand.  Symptoms previously attempted to be treated with injections into his neck.  Patient has previously had anterior cervical discectomy and fusion of C2-C3. His medications of gabapentin, muscle relaxants, and even taking oxycodone 10 mg were not helping with his pain.  He is scheduled for appointment with [facility] on 6/29 as patient is originally from [place] where he was followed by pain management specialist.  ED Course: Upon admission into the emergency department patient was noted to be afebrile with pulse 61-1 02, blood pressures 119/79-150 6/94, and O2 saturations maintained on room air..  Labs were relatively unremarkable.  Imaging studies x-rays and CT scan of the cervical spine did not note any abnormalities that appeared to be new.  Given  Decadron 10 mg IV, ketorolac, Robaxin, and several  doses of Dilaudid without improvement in pain symptoms.  Plan was to obtain a MRI of the cervical spine.  TRH called to admit for assistance with pain control.  Review of Systems  Constitutional: Negative for fever.  HENT: Negative for ear discharge and nosebleeds.   Eyes: Negative for photophobia and pain.  Respiratory: Negative for cough and shortness of breath.   Cardiovascular: Negative for chest pain and leg swelling.  Gastrointestinal: Negative for abdominal pain, diarrhea, nausea and vomiting.  Genitourinary: Negative for dysuria and hematuria.  Musculoskeletal: Positive for back pain, falls and neck pain.  Skin: Negative for rash.  Neurological: Positive for sensory change. Negative for loss of consciousness.  Psychiatric/Behavioral: The patient has insomnia.     Past Medical History:  Diagnosis Date  . Arthritis    Phreesia 12/05/2019  . BPH (benign prostatic hyperplasia)   . Chronic pain   . DDD (degenerative disc disease), cervical   . DDD (degenerative disc disease), lumbar   . Depression    Phreesia 12/05/2019  . Hyperlipidemia   . Hypertension   . Neuromuscular disorder (HCC)    Phreesia 12/05/2019  . Post laminectomy syndrome   . Spinal stenosis     Past Surgical History:  Procedure Laterality Date  . CERVICAL FUSION    . LAMINECTOMY AND MICRODISCECTOMY LUMBAR SPINE    . SPINAL FUSION    . SPINE SURGERY N/A    Phreesia 12/05/2019     reports that he has quit smoking. He has never used smokeless tobacco. He reports current alcohol use of about 2.0 standard drinks of alcohol  per week. No history on file for drug use.  Allergies  Allergen Reactions  . Strawberry (Diagnostic) Anaphylaxis  . Other Hives    Cat Dander    Family History  Problem Relation Age of Onset  . Cancer Mother   . Depression Mother   . Depression Father   . Thyroid cancer Father   . Depression Sister   . Depression Brother   . Hypertension Maternal Grandmother   .  Hyperlipidemia Maternal Grandmother   . Heart disease Maternal Grandmother   . Atrial fibrillation Maternal Grandmother   . Diabetes Maternal Grandmother     Prior to Admission medications   Medication Sig Start Date End Date Taking? Authorizing Provider  escitalopram (LEXAPRO) 20 MG tablet Take 1 tablet (20 mg total) by mouth in the morning. 12/07/19  Yes Arvilla Market, DO  gabapentin (NEURONTIN) 600 MG tablet Take 1 tablet (600 mg total) by mouth 3 (three) times daily. 12/07/19  Yes Arvilla Market, DO  ibuprofen (ADVIL) 200 MG tablet Take 800 mg by mouth daily as needed for moderate pain.    Yes [provider]  melatonin 5 MG TABS Take 5 mg by mouth at bedtime.   Yes [provider]  oxyCODONE (OXY IR/ROXICODONE) 5 MG immediate release tablet Take 1 tablet (5 mg total) by mouth every 6 (six) hours as needed for severe pain. 12/09/19  Yes Mancel Bale, MD  tamsulosin (FLOMAX) 0.4 MG CAPS capsule Take 1 capsule (0.4 mg total) by mouth at bedtime. 12/07/19  Yes Arvilla Market, DO  tizanidine (ZANAFLEX) 2 MG capsule Take 2 mg by mouth 3 (three) times daily as needed for muscle spasms.    Yes [provider]    Physical Exam:  Constitutional: Young male who appears to be in no acute distress Vitals:   12/16/19 2305 12/17/19 0100 12/17/19 0245 12/17/19 0300  BP:  119/79 131/82 134/85  Pulse: 74 77 86 89  Resp:    16  Temp:      TempSrc:      SpO2: 95% 93% 94% 93%  Weight:      Height:       Eyes: PERRL, lids and conjunctivae normal ENMT: Mucous membranes are moist. Posterior pharynx clear of any exudate or lesions. Normal dentition.  Neck: normal, supple, no masses, no thyromegaly Respiratory: clear to auscultation bilaterally, no wheezing, no crackles. Normal respiratory effort. No accessory muscle use.  Cardiovascular: Regular rate and rhythm, no murmurs / rubs / gallops. No extremity edema. 2+ pedal pulses. No carotid bruits.   Abdomen: no tenderness, no masses palpated. No hepatosplenomegaly. Bowel sounds positive.  Musculoskeletal: no clubbing / cyanosis.  No tenderness to palpation of the spine.  Spinal stimulator palpated on the right lower flank.  Skin: no rashes, lesions, ulcers. No induration Neurologic: CN 2-12 grossly intact. Sensation intact, DTR normal. Strength 5/5 in all 4.  Psychiatric: Normal judgment and insight. Alert and oriented x 3. Normal mood.     Labs on Admission: I have personally reviewed following labs and imaging studies  CBC: Recent Labs  Lab 12/16/19 2236  WBC 8.0  HGB 15.2  HCT 45.1  MCV 85.7  PLT 268   Basic Metabolic Panel: Recent Labs  Lab 12/16/19 2236  NA 137  K 4.2  CL 106  CO2 22  GLUCOSE 105*  BUN 19  CREATININE 0.92  CALCIUM 9.3   GFR: Estimated Creatinine Clearance: 137.6 mL/min (by C-G formula based on SCr of 0.92  mg/dL). Liver Function Tests: Recent Labs  Lab 12/16/19 2236  AST 24  ALT 33  ALKPHOS 48  BILITOT 0.6  PROT 6.7  ALBUMIN 3.9   No results for input(s): LIPASE, AMYLASE in the last 168 hours. No results for input(s): AMMONIA in the last 168 hours. Coagulation Profile: No results for input(s): INR, PROTIME in the last 168 hours. Cardiac Enzymes: No results for input(s): CKTOTAL, CKMB, CKMBINDEX, TROPONINI in the last 168 hours. BNP (last 3 results) No results for input(s): PROBNP in the last 8760 hours. HbA1C: No results for input(s): HGBA1C in the last 72 hours. CBG: No results for input(s): GLUCAP in the last 168 hours. Lipid Profile: No results for input(s): CHOL, HDL, LDLCALC, TRIG, CHOLHDL, LDLDIRECT in the last 72 hours. Thyroid Function Tests: No results for input(s): TSH, T4TOTAL, FREET4, T3FREE, THYROIDAB in the last 72 hours. Anemia Panel: No results for input(s): VITAMINB12, FOLATE, FERRITIN, TIBC, IRON, RETICCTPCT in the last 72 hours. Urine analysis: No results found for: COLORURINE, APPEARANCEUR, LABSPEC,  PHURINE, GLUCOSEU, HGBUR, BILIRUBINUR, KETONESUR, PROTEINUR, UROBILINOGEN, NITRITE, LEUKOCYTESUR Sepsis Labs: No results found for this or any previous visit (from the past 240 hour(s)).   Radiological Exams on Admission: DG Cervical Spine Complete  Result Date: 12/16/2019 CLINICAL DATA:  34 year old male with neck pain and muscle stiffness. EXAM: CERVICAL SPINE - COMPLETE 4+ VIEW COMPARISON:  None. FINDINGS: On the lateral view there appears to be congenital incomplete segmentation of C2-C3 plus superimposed prior C3-C4 ACDF. It is unclear whether there is C3-C4 arthrodesis. Relatively preserved disc spaces elsewhere but bulky endplate spurring at C4-C5. C1-C2 alignment appears to remain normal. Normal prevertebral soft tissue contour. Bilateral posterior element alignment is within normal limits. Cervicothoracic junction alignment is within normal limits. No acute osseous abnormality identified. Negative visible upper chest. IMPRESSION: 1. 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 2. Bulky endplate degeneration at the adjacent C4-C5 segment. 3. No acute osseous abnormality identified in the cervical spine. Electronically Signed   By: Odessa Fleming M.D.   On: 12/16/2019 21:32   CT Cervical Spine Wo Contrast  Result Date: 12/17/2019 CLINICAL DATA:  Fall down stairs a few days ago with neck pain. EXAM: CT CERVICAL SPINE WITHOUT CONTRAST TECHNIQUE: Multidetector CT imaging of the cervical spine was performed without intravenous contrast. Multiplanar CT image reconstructions were also generated. COMPARISON:  Radiography from yesterday FINDINGS: Alignment: Lateral atlantodental asymmetry, wider on the right, but no subluxation or underlying fracture. Retro dental ligamentous complex is still visible. No predental widening. Skull base and vertebrae: C2-3 non segmentation and C3-4 ACDF with solid arthrodesis. No acute fracture. Soft tissues and spinal canal: Negative Disc levels: C5-6  ventral spondylitic spurring. No visible impingement Upper chest: Negative IMPRESSION: 1. No acute fracture. 2. Lateral atlantodental asymmetry without underlying traumatic finding, possibly developmental. There is a C2-3 non segmentation. 3. C3-4 ACDF with solid arthrodesis. Electronically Signed   By: Marnee Spring M.D.   On: 12/17/2019 06:15      Assessment/Plan Neck pain, cervical radiculitis: Acute on chronic.  Patient presented with complaints of severe neck pain with radiation down both arms.  Prior history of anterior cervical discectomy and fusion of C2-C3 and neck injections for treatment.  Patient had received several rounds of IV pain medication in the emergency department without improvement in pain MRI revealed mild degenerative changes at C4-5 with mild bilateral neural stenosis.  Neurosurgery was formally consulted, but did not feel patient's symptoms were consistent with imaging findings.   -  Admit to med surge bed -De-escalating frequency of IV Dilaudid -Continue current home pain regimen and consider increasing gabapentin -Ketorolac IV as needed -Physical therapy consulted  -Appreciate neurosurgery consultative services  Chronic pain: Patient long history of chronic pain for which he was previously under pain management in [place]. -Continue pain medications as seen above -Outpatient follow-up with [facility]  History of fall: Patient reports having a mechanical falling down approximately 3 steps a week ago.  No acute injuries appreciated on imaging.  Depression/anxiety -Continue Lexapro  BPH -Continue meds Flomax  DVT prophylaxis: Lovenox Code Status: Full Family Communication: Plan of care discussed with the patient and family present at bedside Disposition Plan: Likely discharge home 1 to 3 days Consults called: Neurosurgery Admission status: Inpatient  Norval Morton MD [facility] Pager [phone]   If 7PM-7AM, please  contact night-coverage [email or web address] Password [facility]  12/17/2019, 8:49 AM

## 2019-12-17 NOTE — ED Provider Notes (Signed)
Care assumed from [place], [place], at shift change, please see their notes for full documentation of patient's complaint/HPI. Briefly, pt here with worsening neck pain and paresthesias to neck/down BUEs s/p fall on 06/03. Seen in the ED on 06/03 with back pain and reassuring L spine MRI. Results so far show surgical changes noted to xray of neck and CT C spine. Awaiting MRI C spine however specific MRI machine pt needs s/2 to his spinal cord stimulator is currently down; facilities working on it currently. Plan is to attempt to admit pt to medicine for pain control with plan for MRI. Pt does have appointment on 06/29 with [facility].  Physical Exam  BP 134/85   Pulse 89   Temp 98.9 F (37.2 C) (Oral)   Resp 16   Ht 5\' 11"  (1.803 m)   Wt 102.1 kg   SpO2 93%   BMI 31.38 kg/m   Physical Exam  ED Course/Procedures   Clinical Course as of Dec 16 704  Fri Dec 17, 2019  0614 Spoke with Candace, MRI tech.  She states because patient has a spinal cord stimulator in place, he would need to have MRI on specific machine.  The specific machine is not currently operating.  They are actively working on the machine and are hoping to have it up and running today.   [SJ]    Clinical Course User Index [SJ] Joy, Shawn C, PA-C    Procedures  MDM  Discussed case with Dr. 01-06-2000 [facility] who agrees to accept patient for admission. COVID test ordered.       Katrinka Blazing, PA-C 12/17/19 0723    02/16/20, DO 12/17/19 1212

## 2019-12-17 NOTE — Plan of Care (Signed)

## 2019-12-18 ENCOUNTER — Encounter (HOSPITAL_COMMUNITY): Payer: Self-pay | Admitting: Internal Medicine

## 2019-12-18 DIAGNOSIS — G894 Chronic pain syndrome: Secondary | ICD-10-CM | POA: Diagnosis present

## 2019-12-18 DIAGNOSIS — M5412 Radiculopathy, cervical region: Secondary | ICD-10-CM | POA: Diagnosis present

## 2019-12-18 LAB — HIV ANTIBODY (ROUTINE TESTING W REFLEX): HIV Screen 4th Generation wRfx: NONREACTIVE

## 2019-12-18 MED ORDER — POLYETHYLENE GLYCOL 3350 17 G PO PACK: 17.0000 g | PACK | Freq: Every day | ORAL | Status: DC | PRN

## 2019-12-18 MED ORDER — SENNOSIDES-DOCUSATE SODIUM 8.6-50 MG PO TABS: 2.0000 | ORAL_TABLET | Freq: Every evening | ORAL | Status: DC | PRN

## 2019-12-18 MED ORDER — GABAPENTIN 600 MG PO TABS
1200.0000 mg | ORAL_TABLET | Freq: Every day | ORAL | Status: AC
  Administered 2019-12-18 – 2019-12-19 (×2): 1200 mg via ORAL
  Filled 2019-12-18 (×2): qty 2

## 2019-12-18 MED ORDER — NAPROXEN 250 MG PO TABS
250.0000 mg | ORAL_TABLET | Freq: Two times a day (BID) | ORAL | Status: DC
  Administered 2019-12-18 – 2019-12-21 (×7): 250 mg via ORAL
  Filled 2019-12-18 (×8): qty 1

## 2019-12-18 MED ORDER — DEXAMETHASONE 6 MG PO TABS
6.0000 mg | ORAL_TABLET | Freq: Every day | ORAL | Status: DC
  Administered 2019-12-18 – 2019-12-21 (×4): 6 mg via ORAL
  Filled 2019-12-18 (×4): qty 1

## 2019-12-18 MED ORDER — GABAPENTIN 300 MG PO CAPS
600.0000 mg | ORAL_CAPSULE | ORAL | Status: DC
  Administered 2019-12-18 – 2019-12-21 (×7): 600 mg via ORAL
  Filled 2019-12-18 (×7): qty 2

## 2019-12-18 NOTE — Evaluation (Signed)
Physical Therapy Evaluation Patient Details Name: Lawrence Cantu MRN: [id number] DOB: June 24, 1986 Today's Date: 12/18/2019   History of Present Illness  Patient admitted to the hospital with severe neck pain and left hip pain. he had a fall down the satairs 5 days ago and is now having difficulty walking. He has a 5 year history of loweer back pain and has had lumbar and cervical fusion   Clinical Impression  Patient presents with decreased general mobility. He has increased pain in his left hip and neck when he stands and walks. He uses both hands on his cane. At this time he may benefit from a walker until he can improve his gait. He may also benefit from skilled therapy at an outpatient clinic to work on chronic pain management techniques and to improve general mobility.     Follow Up Recommendations Outpatient PT (suggested [address] location for chronic pain techniques)    Equipment Recommendations  Rolling walker with 5" wheels (may benefit when he has an acute exacerbation of symptoms )    Recommendations for Other Services       Precautions / Restrictions Precautions Precautions: Fall Restrictions Weight Bearing Restrictions: No      Mobility  Bed Mobility Overal bed mobility: Independent             General bed mobility comments: able to sit edge of the bed without assitance   Transfers Overall transfer level: Needs assistance Equipment used: Straight cane Transfers: Sit to/from Stand Sit to Stand: Min guard         General transfer comment: uses both hands on cane tio stand up. Both legs are unsteady but no loss of balance   Ambulation/Gait Ambulation/Gait assistance: Min guard Gait Distance (Feet): 15 Feet Assistive device: Straight cane Gait Pattern/deviations: Step-to pattern Gait velocity: decreased  Gait velocity interpretation: <1.31 ft/sec, indicative of household ambulator General Gait Details: slow step to gait pattern leaning both hands and  his weight on the cane. Therapy sugeested Rolling walker to improve posture.   Stairs            Wheelchair Mobility    Modified Rankin (Stroke Patients Only)       Balance Overall balance assessment: Needs assistance Sitting-balance support: No upper extremity supported;Feet supported Sitting balance-Leahy Scale: Good     Standing balance support: Bilateral upper extremity supported Standing balance-Leahy Scale: Poor Standing balance comment: leaned hevily  on cane                              Pertinent Vitals/Pain      Home Living Family/patient expects to be discharged to:: Private residence Living Arrangements: Spouse/significant other Available Help at Discharge: Family Type of Home: House Home Access: Level entry     Home Layout: Two level   Additional Comments: can sleep on the first level idf needed.     Prior Function Level of Independence: Independent with assistive device(s)         Comments: uses a cane at times. Following the fall he has needed his cane more      Hand Dominance        Extremity/Trunk Assessment   Upper Extremity Assessment Upper Extremity Assessment: Overall WFL for tasks assessed    Lower Extremity Assessment Lower Extremity Assessment: RLE deficits/detail;LLE deficits/detail RLE Deficits / Details: mild hip flexion deficcit 4+/5  LLE Deficits / Details: 4+/5 hip flexion strength  Communication   Communication: No difficulties  Cognition Arousal/Alertness: Awake/alert Behavior During Therapy: Restless Overall Cognitive Status: Within Functional Limits for tasks assessed                                 General Comments: shifts frequently 2nd to pain       General Comments General comments (skin integrity, edema, etc.): patient reproted no significant tenderness to palpation of his upper traps although he did have decreased cervical rotation left> right     Exercises      Assessment/Plan    PT Assessment Patient needs continued PT services  PT Problem List Decreased strength;Decreased range of motion;Decreased activity tolerance;Decreased mobility;Decreased knowledge of use of DME;Decreased safety awareness;Pain       PT Treatment Interventions Gait training;DME instruction;Functional mobility training;Stair training;Therapeutic activities;Therapeutic exercise;Neuromuscular re-education;Patient/family education    PT Goals (Current goals can be found in the Care Plan section)  Acute Rehab PT Goals Patient Stated Goal: to have less pain  PT Goal Formulation: With patient Time For Goal Achievement: 01/01/20 Potential to Achieve Goals: Good    Frequency Min 2X/week   Barriers to discharge        Co-evaluation               AM-PAC PT "6 Clicks" Mobility  Outcome Measure                  End of Session Equipment Utilized During Treatment: Gait belt Activity Tolerance: No increased pain Patient left: in chair;with call bell/phone within reach;with chair alarm set Nurse Communication: Mobility status PT Visit Diagnosis: Unsteadiness on feet (R26.81);Other abnormalities of gait and mobility (R26.89);Pain Pain - Right/Left:  (bilsteral ) Pain - part of body:  (neck/back)    Time: 2355-7322 PT Time Calculation (min) (ACUTE ONLY): 33 min   Charges:   PT Evaluation $PT Eval Moderate Complexity: 1 Mod           Carney Living PT DPT  12/18/2019, 10:03 AM

## 2019-12-18 NOTE — Progress Notes (Signed)
[facility] PROGRESS NOTE   Lawrence Cantu [id number] DOB: 11-10-1985 DOA: 12/16/2019  PCP: Arvilla Market, DO  Brief History/Interval Summary: 34 y.o. male with medical history significant of hypertension, hyperlipidemia, BPH, DDD, chronic pain s/p spinal cord stimulator, spinal stenosis, postlaminectomy syndrome, history of cauda equina syndrome, neck pain, and prior back and neck surgeries who presented with complaints of severe neck pain.  He reports falling down 3 steps a little bit over a week ago.  Patient denies any loss of consciousness.  He had been seen in the emergency department on 6/3 with complaints of back pain, and was evaluated with a lumbar MRI which did not note any acute abnormality.  Normally patient walks with use of a cane, but has been having trouble doing that due to his symptoms. Symptoms previously attempted to be treated with injections into his neck.  Patient has previously had anterior cervical discectomy and fusion of C2-C3. His medications of gabapentin, muscle relaxants, and even taking oxycodone 10 mg were not helping with his pain.  He is scheduled for appointment with [place] neurosurgery on 6/29 as patient is originally from [place] where he was followed by pain management specialist  Reason for Visit: Neck pain and back pain  Consultants: Neurosurgery  Procedures: None  Antibiotics: Anti-infectives (From admission, onward)   None      Subjective/Interval History: Patient continues to have pain in his neck and back area.  Not much improvement compared to yesterday.  7 out of 10 in intensity.  No bowel or urinary incontinence.  No nausea vomiting.  Some headache is present.  ROS: No chest pain or shortness of breath.    Assessment/Plan:  Acute on chronic neck pain with back pain with evidence for cervical radiculitis Patient was evaluated in the emergency department.  CT scan and MRI was done.  Patient seen by neurosurgery.   No role for any kind of procedures at this time.  Pain control.  Add NSAIDs.  Steroid taper as recommended by neurosurgery.  PT evaluation.  Does not have any red flag symptoms.  Hopefully patient will feel better with these interventions.  Chronic pain Continue his pain medications as before.  He is noted to be on oxycodone and gabapentin.  History of fall This occurred about 1 week ago.  Has not sustained any orthopedic injuries.  History of depression and anxiety Continue Lexapro  History of BPH Continue Flomax.  Obesity Estimated body mass index is 31.38 kg/m as calculated from the following:   Height as of this encounter: 5\' 11"  (1.803 m).   Weight as of this encounter: 102.1 kg.   DVT Prophylaxis: Lovenox Code Status: Full code Family Communication: Discussed with the patient Disposition Plan:  Status is: Inpatient  Remains inpatient appropriate because:Unsafe d/c plan and high risk of falls   Dispo: The patient is from: Home              Anticipated d/c is to: Home              Anticipated d/c date is: 1 day              Patient currently is not medically stable to d/c.      Medications:  Scheduled: . dexamethasone  6 mg Oral Daily  . enoxaparin (LOVENOX) injection  40 mg Subcutaneous Q24H  . escitalopram  20 mg Oral q AM  . gabapentin  600 mg Oral 2 times per day  . gabapentin  1,200  mg Oral QHS  . melatonin  5 mg Oral QHS  . naproxen  250 mg Oral BID WC  . sodium chloride flush  3 mL Intravenous Q12H  . tamsulosin  0.4 mg Oral QHS   Continuous:  FHL:[id number] **OR** acetaminophen, albuterol, HYDROmorphone (DILAUDID) injection, ketorolac, LORazepam, ondansetron **OR** ondansetron (ZOFRAN) IV, oxyCODONE, polyethylene glycol, senna-docusate, tiZANidine   Objective:  Vital Signs  Vitals:   12/17/19 1217 12/17/19 1925 12/18/19 0332 12/18/19 0833  BP: 116/68 105/74 126/81 113/69  Pulse: 74 82 76 78  Resp: 17 17 19 18   Temp: 97.6 F (36.4 C) 98.3  F (36.8 C) 98 F (36.7 C) 98.4 F (36.9 C)  TempSrc: Oral Oral Oral Oral  SpO2: 98% 97% 98% 98%  Weight:      Height:        Intake/Output Summary (Last 24 hours) at 12/18/2019 0944 Last data filed at 12/18/2019 0300 Gross per 24 hour  Intake --  Output 400 ml  Net -400 ml   Filed Weights   12/16/19 1605  Weight: 102.1 kg    General appearance: Awake alert.  In no distress Resp: Clear to auscultation bilaterally.  Normal effort Cardio: S1-S2 is normal regular.  No S3-S4.  No rubs murmurs or bruit GI: Abdomen is soft.  Nontender nondistended.  Bowel sounds are present normal.  No masses organomegaly Extremities: No edema.  Restricted range of motion of the lower extremities but still is able to move them Neurologic: Alert and oriented x3.  Cranial nerves II to XII intact.  Motor strength equal bilateral upper and lower extremities.  Noted to be weaker in the legs due to his chronic back issues.   Lab Results:  Data Reviewed: I have personally reviewed following labs and imaging studies  CBC: Recent Labs  Lab 12/16/19 2236  WBC 8.0  HGB 15.2  HCT 45.1  MCV 85.7  PLT 287    Basic Metabolic Panel: Recent Labs  Lab 12/16/19 2236  NA 137  K 4.2  CL 106  CO2 22  GLUCOSE 105*  BUN 19  CREATININE 0.92  CALCIUM 9.3    GFR: Estimated Creatinine Clearance: 137.6 mL/min (by C-G formula based on SCr of 0.92 mg/dL).  Liver Function Tests: Recent Labs  Lab 12/16/19 2236  AST 24  ALT 33  ALKPHOS 48  BILITOT 0.6  PROT 6.7  ALBUMIN 3.9      Radiology Studies: DG Cervical Spine Complete  Result Date: 12/16/2019 CLINICAL DATA:  34 year old male with neck pain and muscle stiffness. EXAM: CERVICAL SPINE - COMPLETE 4+ VIEW COMPARISON:  None. FINDINGS: On the lateral view there appears to be congenital incomplete segmentation of C2-C3 plus superimposed prior C3-C4 ACDF. It is unclear whether there is C3-C4 arthrodesis. Relatively preserved disc spaces elsewhere  but bulky endplate spurring at G8-T1. C1-C2 alignment appears to remain normal. Normal prevertebral soft tissue contour. Bilateral posterior element alignment is within normal limits. Cervicothoracic junction alignment is within normal limits. No acute osseous abnormality identified. Negative visible upper chest. IMPRESSION: 1. 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 2. Bulky endplate degeneration at the adjacent C4-C5 segment. 3. No acute osseous abnormality identified in the cervical spine. Electronically Signed   By: Genevie Ann M.D.   On: 12/16/2019 21:32   CT Cervical Spine Wo Contrast  Result Date: 12/17/2019 CLINICAL DATA:  Fall down stairs a few days ago with neck pain. EXAM: CT CERVICAL SPINE WITHOUT CONTRAST TECHNIQUE: Multidetector CT imaging of the cervical  spine was performed without intravenous contrast. Multiplanar CT image reconstructions were also generated. COMPARISON:  Radiography from yesterday FINDINGS: Alignment: Lateral atlantodental asymmetry, wider on the right, but no subluxation or underlying fracture. Retro dental ligamentous complex is still visible. No predental widening. Skull base and vertebrae: C2-3 non segmentation and C3-4 ACDF with solid arthrodesis. No acute fracture. Soft tissues and spinal canal: Negative Disc levels: C5-6 ventral spondylitic spurring. No visible impingement Upper chest: Negative IMPRESSION: 1. No acute fracture. 2. Lateral atlantodental asymmetry without underlying traumatic finding, possibly developmental. There is a C2-3 non segmentation. 3. C3-4 ACDF with solid arthrodesis. Electronically Signed   By: Marnee Spring M.D.   On: 12/17/2019 06:15   MR Cervical Spine Wo Contrast  Result Date: 12/17/2019 CLINICAL DATA:  Worsening neck pain with radiation paresthesia of the arms. EXAM: MRI CERVICAL SPINE WITHOUT CONTRAST TECHNIQUE: Multiplanar, multisequence MR imaging of the cervical spine was performed. No intravenous  contrast was administered. COMPARISON:  CT of the cervical spine December 17, 2019 FINDINGS: The study is partially degraded by motion, particularly the axial images. Alignment: Straightening of the cervical curvature. Vertebrae: No fracture, evidence of discitis, or bone lesion. C2-3 no segmentation and C3-4 ACDF. Cord: Normal signal and morphology. Posterior Fossa, vertebral arteries, paraspinal tissues: Negative. Disc levels: C2-3: No spinal canal or neural foraminal stenosis C3-4: No spinal canal or neural foraminal stenosis. C4-5: Mild uncovertebral and facet degenerative changes resulting in mild bilateral neural foraminal narrowing. No spinal canal stenosis. C5-6: Tiny posterior disc protrusion. No spinal canal or neural foraminal stenosis. C6-7: No spinal canal or neural foraminal stenosis. C7-T1: No spinal canal or neural foraminal stenosis. IMPRESSION: 1. Motion degraded study. 2. C2-3 no segmentation and C3-4 ACDF. 3. Mild degenerative changes at C4-5 with mild bilateral neural foraminal narrowing. 4. No spinal canal stenosis of the cervical spine. Electronically Signed   By: Baldemar Lenis M.D.   On: 12/17/2019 11:17       LOS: 1 day   Elynor Kallenberger  [facility] Pager on [email or web address]  12/18/2019, 9:44 AM

## 2019-12-18 NOTE — Plan of Care (Signed)

## 2019-12-19 ENCOUNTER — Inpatient Hospital Stay (HOSPITAL_COMMUNITY): Payer: Medicare Other

## 2019-12-19 ENCOUNTER — Encounter (HOSPITAL_COMMUNITY): Payer: Self-pay | Admitting: Internal Medicine

## 2019-12-19 LAB — BASIC METABOLIC PANEL
Anion gap: 10 (ref 5–15)
BUN: 13 mg/dL (ref 6–20)
CO2: 23 mmol/L (ref 22–32)
Calcium: 8.9 mg/dL (ref 8.9–10.3)
Chloride: 105 mmol/L (ref 98–111)
Creatinine, Ser: 0.91 mg/dL (ref 0.61–1.24)
GFR calc Af Amer: >60 mL/min (ref >60)
GFR calc non Af Amer: >60 mL/min (ref >60)
Glucose, Bld: 97 mg/dL (ref 70–99)
Potassium: 3.7 mmol/L (ref 3.5–5.1)
Sodium: 138 mmol/L (ref 135–145)

## 2019-12-19 MED ORDER — GABAPENTIN 400 MG PO CAPS
1200.0000 mg | ORAL_CAPSULE | Freq: Every day | ORAL | Status: DC
  Administered 2019-12-20: 1200 mg via ORAL
  Filled 2019-12-19: qty 3

## 2019-12-19 MED ORDER — GADOBUTROL 1 MMOL/ML IV SOLN
10.0000 mL | Freq: Once | INTRAVENOUS | Status: AC | PRN
  Administered 2019-12-19: 10 mL via INTRAVENOUS

## 2019-12-19 NOTE — Plan of Care (Signed)

## 2019-12-19 NOTE — Progress Notes (Signed)
[facility] PROGRESS NOTE   Lawrence Cantu [id number]:[id number] DOB: 1985-10-31 DOA: 12/16/2019  PCP: Arvilla Market, DO  Brief History/Interval Summary: 34 y.o. male with medical history significant of hypertension, hyperlipidemia, BPH, DDD, chronic pain s/p spinal cord stimulator, spinal stenosis, postlaminectomy syndrome, history of cauda equina syndrome, neck pain, and prior back and neck surgeries who presented with complaints of severe neck pain.  He reports falling down 3 steps a little bit over a week ago.  Patient denies any loss of consciousness.  He had been seen in the emergency department on 6/3 with complaints of back pain, and was evaluated with a lumbar MRI which did not note any acute abnormality.  Normally patient walks with use of a cane, but has been having trouble doing that due to his symptoms. Symptoms previously attempted to be treated with injections into his neck.  Patient has previously had anterior cervical discectomy and fusion of C2-C3. His medications of gabapentin, muscle relaxants, and even taking oxycodone 10 mg were not helping with his pain.  He is scheduled for appointment with [place] neurosurgery on 6/29 as patient is originally from [place] where he was followed by pain management specialist  Reason for Visit: Neck pain and back pain  Consultants: Neurosurgery  Procedures: None  Antibiotics: Anti-infectives (From admission, onward)   None      Subjective/Interval History: Patient states that he is feeling terrible this morning.  He could not sleep well last night due to persistent headache neck pain.  He is having a lot of difficulty walking still.  No urinary or bowel incontinence.  Pain is 7-8 out of 10 in intensity with radiation of the pain down both the arms.  ROS: Denies any chest pain or shortness of breath.    Assessment/Plan:  Acute on chronic neck pain with back pain with evidence for cervical radiculitis/spinal cord  stimulator Patient was evaluated in the emergency department.  CT scan and MRI was done.  Patient seen by neurosurgery.  No role for any kind of procedures at this time.  Pain control.  Add NSAIDs.  Steroid taper as recommended by neurosurgery.  PT evaluation.  Does not have any red flag symptoms.   Feel any better.  Continue with steroids, narcotics, NSAIDs.  Concern for demyelinating disease process.  Will repeat MRI cervical spine with contrast.  Do MRI brain as well.  No focal deficits noted otherwise. Patient apparently has an appointment with neurosurgery in their office on June 29.  Chronic pain Continue his pain medications as before.  He is noted to be on oxycodone and gabapentin.  History of fall This occurred about 1 week ago.  Has not sustained any orthopedic injuries.  History of depression and anxiety Continue Lexapro  History of BPH Continue Flomax.  Obesity Estimated body mass index is 31.38 kg/m as calculated from the following:   Height as of this encounter: 5\' 11"  (1.803 m).   Weight as of this encounter: 102.1 kg.   DVT Prophylaxis: Lovenox Code Status: Full code Family Communication: Discussed with the patient Disposition Plan:  Status is: Inpatient  Remains inpatient appropriate because:Ongoing active pain requiring inpatient pain management and Ongoing diagnostic testing needed not appropriate for outpatient work up   Dispo:  Patient From: Home  Planned Disposition: Home  Expected discharge date: 12/20/19  Medically stable for discharge: No       Medications:  Scheduled: . dexamethasone  6 mg Oral Daily  . enoxaparin (LOVENOX) injection  40  mg Subcutaneous Q24H  . escitalopram  20 mg Oral q AM  . gabapentin  600 mg Oral 2 times per day  . gabapentin  1,200 mg Oral QHS  . melatonin  5 mg Oral QHS  . naproxen  250 mg Oral BID WC  . sodium chloride flush  3 mL Intravenous Q12H  . tamsulosin  0.4 mg Oral QHS   Continuous:  XMI:[id number]  **OR** acetaminophen, albuterol, HYDROmorphone (DILAUDID) injection, ketorolac, LORazepam, ondansetron **OR** ondansetron (ZOFRAN) IV, oxyCODONE, polyethylene glycol, senna-docusate, tiZANidine   Objective:  Vital Signs  Vitals:   12/18/19 1618 12/18/19 1958 12/19/19 0308 12/19/19 0812  BP: 115/69 128/78 114/76 115/82  Pulse: 80 72 69 64  Resp: 18 17 14 14   Temp: (!) 97.5 F (36.4 C) 97.7 F (36.5 C) 97.7 F (36.5 C) 98.4 F (36.9 C)  TempSrc: Oral Oral Oral Oral  SpO2: 96% 97% 97% 98%  Weight:      Height:        Intake/Output Summary (Last 24 hours) at 12/19/2019 1114 Last data filed at 12/19/2019 0851 Gross per 24 hour  Intake 240 ml  Output --  Net 240 ml   Filed Weights   12/16/19 1605  Weight: 102.1 kg    General appearance: Awake alert.  In no distress.  Seems to be in discomfort. Resp: Clear to auscultation bilaterally.  Normal effort Cardio: S1-S2 is normal regular.  No S3-S4.  No rubs murmurs or bruit GI: Abdomen is soft.  Nontender nondistended.  Bowel sounds are present normal.  No masses organomegaly No obvious deformities noted on the back or in the back of the neck. Extremities: No edema.   Neurologic: Alert and oriented x3.  Weakness bilateral lower extremities as noted previously.  No other focal deficits noted.   Lab Results:  Data Reviewed: I have personally reviewed following labs and imaging studies  CBC: Recent Labs  Lab 12/16/19 2236  WBC 8.0  HGB 15.2  HCT 45.1  MCV 85.7  PLT 037    Basic Metabolic Panel: Recent Labs  Lab 12/16/19 2236 12/19/19 0935  NA 137 138  K 4.2 3.7  CL 106 105  CO2 22 23  GLUCOSE 105* 97  BUN 19 13  CREATININE 0.92 0.91  CALCIUM 9.3 8.9    GFR: Estimated Creatinine Clearance: 139.1 mL/min (by C-G formula based on SCr of 0.91 mg/dL).  Liver Function Tests: Recent Labs  Lab 12/16/19 2236  AST 24  ALT 33  ALKPHOS 48  BILITOT 0.6  PROT 6.7  ALBUMIN 3.9      Radiology Studies: No  results found.     LOS: 2 days   [facility] Pager on [email or web address]  12/19/2019, 11:14 AM

## 2019-12-19 NOTE — Progress Notes (Signed)
Pt to MRI via transport.

## 2019-12-20 ENCOUNTER — Encounter: Payer: Self-pay | Admitting: Internal Medicine

## 2019-12-20 LAB — SEDIMENTATION RATE: Sed Rate: 4 mm/hr (ref 0–16)

## 2019-12-20 LAB — C-REACTIVE PROTEIN: CRP: 1.1 mg/dL — ABNORMAL HIGH (ref <1.0)

## 2019-12-20 LAB — CK: Total CK: 50 U/L (ref 49–397)

## 2019-12-20 MED ORDER — TIZANIDINE HCL 2 MG PO CAPS: 2.0000 mg | ORAL_CAPSULE | Freq: Three times a day (TID) | ORAL | 0 refills | Status: DC | PRN

## 2019-12-20 MED ORDER — OXYCODONE HCL 5 MG PO TABS: 5.0000 mg | ORAL_TABLET | Freq: Four times a day (QID) | ORAL | 0 refills | Status: AC | PRN

## 2019-12-20 MED ORDER — SENNOSIDES-DOCUSATE SODIUM 8.6-50 MG PO TABS: 2.0000 | ORAL_TABLET | Freq: Every day | ORAL | 0 refills | Status: DC

## 2019-12-20 MED ORDER — GABAPENTIN 600 MG PO TABS: ORAL_TABLET | ORAL | 1 refills | Status: DC

## 2019-12-20 MED ORDER — POLYETHYLENE GLYCOL 3350 17 G PO PACK: 17.0000 g | PACK | Freq: Every day | ORAL | 0 refills | Status: DC | PRN

## 2019-12-20 MED ORDER — DEXAMETHASONE 2 MG PO TABS: ORAL_TABLET | ORAL | 0 refills | Status: DC

## 2019-12-20 MED ORDER — NAPROXEN 250 MG PO TABS: 250.0000 mg | ORAL_TABLET | Freq: Two times a day (BID) | ORAL | 1 refills | Status: DC

## 2019-12-20 NOTE — Discharge Summary (Addendum)
[facility]  Physician Discharge Summary   Patient ID: Lawrence Cantu MRN: [id number] DOB/AGE: [birth year] 34 y.o.  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 Patient has an appointment with neurosurgery on June 29. 2. Patient is to follow-up with PCP in 1 week 3. Outpatient referral to [facility] neurology for NCV/EMG 4. ANA and HLA-B27 results are still pending 5. PCP to consider increasing Neurontin gradually to a max dose of 1200 mg 3 times a day.    Home Health: None Equipment/Devices: Rolling walker  CODE STATUS: Full code  DISCHARGE CONDITION: fair  Diet recommendation: As before  INITIAL HISTORY: 34 y.o.malewith medical history significant ofhypertension, hyperlipidemia, BPH, DDD,chronic pains/pspinal cord stimulator, spinal stenosis,postlaminectomy syndrome,history of cauda equina syndrome, neck pain,and prior back and neck surgeries who presented with complaints of severe neck pain.He reports falling down 3 stepsalittle bit over aweek ago.Patient denies any loss of consciousness. He had been seen in the emergency department on 6/3with complaints of back pain,and was evaluated with alumbar MRI which did not note any acute abnormality.Normally patient walks with use of a cane, but has been having trouble doing that due to his symptoms. Symptoms previously attempted to be treated with injections into his neck. Patient has previously had anterior cervical discectomy and fusionof C2-C3. His medications of gabapentin, muscle relaxants, and even taking oxycodone 10 mg were not helpingwith hispain. He is scheduled for appointment with [place] neurosurgery on 6/29 as patient is originally from [place] where he  was followed by pain management specialist  Consultations:  Neurosurgery  Procedures:  None    HOSPITAL COURSE:   Acute on chronic neck pain with back pain with evidence for cervical radiculitis/spinal cord stimulator Patient was evaluated in the emergency department.  CT scan and MRI was done.  Patient seen by neurosurgery.  No role for any kind of procedures at this time.  Pain control.  NSAIDs.  Steroid taper as recommended by neurosurgery.  PT evaluation.  Does not have any red flag symptoms.   Due to concern for demyelinating disease process MRI brain and MRI cervical spine was done with contrast.  No demyelinating lesions noted.  Patient reassured. Patient apparently has an appointment with neurosurgery in their office on June 29. Seen by PT.  Rolling walker has been ordered.  Outpatient PT.  Chronic pain Continue his pain medications as before.  He is noted to be on oxycodone and gabapentin.  PDMP data was reviewed.  No prescriptions noted recently.  He will be given a prescription for 20 tablets of oxycodone.  Dose of gabapentin increased from 623m 3 times daily to 6072mtwice a day along with 120034mt nighttime.  History of fall This occurred about 1 week ago.  Has not sustained any orthopedic injuries.  History of depression and anxiety Continue Lexapro  History of BPH Continue Flomax.  Obesity Estimated body mass index is 31.38 kg/m as calculated from the following:   Height as of this encounter: 5' 11"  (1.803 m).   Weight as of this encounter: 102.1 kg.  Overall stable.  Okay for discharge home today.   PERTINENT LABS:  The results of significant diagnostics from this hospitalization (including imaging, microbiology, ancillary and laboratory) are listed below for reference.      Labs:    Basic Metabolic  Panel: Recent Labs  Lab 12/16/19 2236 12/19/19 0935  NA 137 138  K 4.2 3.7  CL 106 105  CO2 22 23  GLUCOSE 105* 97  BUN 19 13   CREATININE 0.92 0.91  CALCIUM 9.3 8.9   Liver Function Tests: Recent Labs  Lab 12/16/19 2236  AST 24  ALT 33  ALKPHOS 48  BILITOT 0.6  PROT 6.7  ALBUMIN 3.9   CBC: Recent Labs  Lab 12/16/19 2236  WBC 8.0  HGB 15.2  HCT 45.1  MCV 85.7  PLT 268     IMAGING STUDIES DG Cervical Spine Complete  Result Date: 12/16/2019 CLINICAL DATA:  34 year old male with neck pain and muscle stiffness. EXAM: CERVICAL SPINE - COMPLETE 4+ VIEW COMPARISON:  None. FINDINGS: On the lateral view there appears to be congenital incomplete segmentation of C2-C3 plus superimposed prior C3-C4 ACDF. It is unclear whether there is C3-C4 arthrodesis. Relatively preserved disc spaces elsewhere but bulky endplate spurring at J0-Z0. C1-C2 alignment appears to remain normal. Normal prevertebral soft tissue contour. Bilateral posterior element alignment is within normal limits. Cervicothoracic junction alignment is within normal limits. No acute osseous abnormality identified. Negative visible upper chest. IMPRESSION: 1. 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 2. Bulky endplate degeneration at the adjacent C4-C5 segment. 3. No acute osseous abnormality identified in the cervical spine. Electronically Signed   By: Genevie Ann M.D.   On: 12/16/2019 21:32   CT Cervical Spine Wo Contrast  Result Date: 12/17/2019 CLINICAL DATA:  Fall down stairs a few days ago with neck pain. EXAM: CT CERVICAL SPINE WITHOUT CONTRAST TECHNIQUE: Multidetector CT imaging of the cervical spine was performed without intravenous contrast. Multiplanar CT image reconstructions were also generated. COMPARISON:  Radiography from yesterday FINDINGS: Alignment: Lateral atlantodental asymmetry, wider on the right, but no subluxation or underlying fracture. Retro dental ligamentous complex is still visible. No predental widening. Skull base and vertebrae: C2-3 non segmentation and C3-4 ACDF with solid arthrodesis. No acute  fracture. Soft tissues and spinal canal: Negative Disc levels: C5-6 ventral spondylitic spurring. No visible impingement Upper chest: Negative IMPRESSION: 1. No acute fracture. 2. Lateral atlantodental asymmetry without underlying traumatic finding, possibly developmental. There is a C2-3 non segmentation. 3. C3-4 ACDF with solid arthrodesis. Electronically Signed   By: Monte Fantasia M.D.   On: 12/17/2019 06:15   MR BRAIN W WO CONTRAST  Result Date: 12/19/2019 CLINICAL DATA:  Cervical radiculopathy. Possible demyelinating disease. EXAM: MRI HEAD WITHOUT AND WITH CONTRAST MRI CERVICAL SPINE WITH CONTRAST TECHNIQUE: Multiplanar, multiecho pulse sequences of the brain and surrounding structures were obtained without and with intravenous contrast. Multiplanar, multiecho pulse sequences of the cervical spine, to include the craniocervical junction and cervicothoracic junction, were obtained following administration of intravenous contrast material. CONTRAST:  68m GADAVIST GADOBUTROL 1 MMOL/ML IV SOLN COMPARISON:  12/17/2019 FINDINGS: MRI HEAD FINDINGS Brain: No acute infarct, acute hemorrhage or extra-axial collection. Normal white matter signal. Normal volume of CSF spaces. No chronic microhemorrhage. Normal midline structures. Cavum velum interpositum cyst. No abnormal enhancement. Vascular: Normal flow voids. Skull and upper cervical spine: Normal marrow signal. Sinuses/Orbits: Negative. Other: None. MRI CERVICAL SPINE FINDINGS Alignment: Normal Vertebrae: C2-3 fusion. Cord: No abnormal contrast enhancement. Posterior Fossa, vertebral arteries, paraspinal tissues: Negative Disc levels: No spinal canal stenosis. IMPRESSION: Normal MRI of the brain and cervical spine. No evidence of demyelinating disease. Electronically Signed   By: KUlyses JarredM.D.   On: 12/19/2019 21:35   MR Cervical Spine Wo  Contrast  Result Date: 12/17/2019 CLINICAL DATA:  Worsening neck pain with radiation paresthesia of the arms.  EXAM: MRI CERVICAL SPINE WITHOUT CONTRAST TECHNIQUE: Multiplanar, multisequence MR imaging of the cervical spine was performed. No intravenous contrast was administered. COMPARISON:  CT of the cervical spine December 17, 2019 FINDINGS: The study is partially degraded by motion, particularly the axial images. Alignment: Straightening of the cervical curvature. Vertebrae: No fracture, evidence of discitis, or bone lesion. C2-3 no segmentation and C3-4 ACDF. Cord: Normal signal and morphology. Posterior Fossa, vertebral arteries, paraspinal tissues: Negative. Disc levels: C2-3: No spinal canal or neural foraminal stenosis C3-4: No spinal canal or neural foraminal stenosis. C4-5: Mild uncovertebral and facet degenerative changes resulting in mild bilateral neural foraminal narrowing. No spinal canal stenosis. C5-6: Tiny posterior disc protrusion. No spinal canal or neural foraminal stenosis. C6-7: No spinal canal or neural foraminal stenosis. C7-T1: No spinal canal or neural foraminal stenosis. IMPRESSION: 1. Motion degraded study. 2. C2-3 no segmentation and C3-4 ACDF. 3. Mild degenerative changes at C4-5 with mild bilateral neural foraminal narrowing. 4. No spinal canal stenosis of the cervical spine. Electronically Signed   By: Pedro Earls M.D.   On: 12/17/2019 11:17   MR CERVICAL SPINE W CONTRAST  Result Date: 12/19/2019 CLINICAL DATA:  Cervical radiculopathy. Possible demyelinating disease. EXAM: MRI HEAD WITHOUT AND WITH CONTRAST MRI CERVICAL SPINE WITH CONTRAST TECHNIQUE: Multiplanar, multiecho pulse sequences of the brain and surrounding structures were obtained without and with intravenous contrast. Multiplanar, multiecho pulse sequences of the cervical spine, to include the craniocervical junction and cervicothoracic junction, were obtained following administration of intravenous contrast material. CONTRAST:  26m GADAVIST GADOBUTROL 1 MMOL/ML IV SOLN COMPARISON:  12/17/2019 FINDINGS: MRI  HEAD FINDINGS Brain: No acute infarct, acute hemorrhage or extra-axial collection. Normal white matter signal. Normal volume of CSF spaces. No chronic microhemorrhage. Normal midline structures. Cavum velum interpositum cyst. No abnormal enhancement. Vascular: Normal flow voids. Skull and upper cervical spine: Normal marrow signal. Sinuses/Orbits: Negative. Other: None. MRI CERVICAL SPINE FINDINGS Alignment: Normal Vertebrae: C2-3 fusion. Cord: No abnormal contrast enhancement. Posterior Fossa, vertebral arteries, paraspinal tissues: Negative Disc levels: No spinal canal stenosis. IMPRESSION: Normal MRI of the brain and cervical spine. No evidence of demyelinating disease. Electronically Signed   By: KUlyses JarredM.D.   On: 12/19/2019 21:35   MR Lumbar Spine W Wo Contrast  Result Date: 12/09/2019 CLINICAL DATA:  Low back pain, spinal stimulator, history of surgery EXAM: MRI LUMBAR SPINE WITHOUT AND WITH CONTRAST TECHNIQUE: Multiplanar and multiecho pulse sequences of the lumbar spine were obtained without and with intravenous contrast. CONTRAST:  173mGADAVIST GADOBUTROL 1 MMOL/ML IV SOLN COMPARISON:  None. FINDINGS: Axial T2 sequence could not be obtained due to technical limitations related to stimulator. Segmentation:  Standard. Alignment:  Anteroposterior alignment is maintained. Vertebrae: Vertebral body heights are preserved. There are postoperative changes of decompression and fusion at L4-L5 with pedicle screws and interbody spacer. The hardware is not well evaluated on this study and there is associated susceptibility artifact. Conus medullaris and cauda equina: Conus extends to the T12-L1 level. Conus and cauda equina appear normal. Paraspinal and other soft tissues: Stimulator is seen entering the spinal canal at approximately the L1 level and extending cranially out of field of view Disc levels: L1-L2:  No significant canal or foraminal stenosis. L2-L3:  No significant canal or foraminal stenosis.  L3-L4: Mild facet arthropathy ligamentum. Congenital narrowing of the canal. No significant degenerative stenosis. L4-L5: Operative level. Canal  has been decompressed. Ill-defined epidural soft tissue is likely postoperative. Small endplate osteophytes are present. There is facet arthropathy. No significant foraminal stenosis. L5-S1: Operative level. Canal has been decompressed. There is prominent ventral epidural fat effacing the thecal sac. Mild facet arthropathy. No significant foraminal stenosis IMPRESSION: Postoperative and degenerative changes as detailed above. No high-grade stenosis. Electronically Signed   By: Macy Mis M.D.   On: 12/09/2019 17:58    DISCHARGE EXAMINATION: Vitals:   12/20/19 1426 12/20/19 1939 12/21/19 0313 12/21/19 0825  BP: (!) 142/87 133/83 (!) 130/92 120/75  Pulse: 80 80 (!) 55 65  Resp: 16 18 18 16   Temp: 98.9 F (37.2 C) 97.9 F (36.6 C) 98 F (36.7 C) 98.6 F (37 C)  TempSrc: Oral Oral Oral Oral  SpO2: 91% 96% 98% 93%  Weight:      Height:       General appearance: Awake alert.  In no distress Resp: Clear to auscultation bilaterally.  Normal effort Cardio: S1-S2 is normal regular.  No S3-S4.  No rubs murmurs or bruit GI: Abdomen is soft.  Nontender nondistended.  Bowel sounds are present normal.  No masses organomegaly   DISPOSITION: Home  Discharge Instructions    Ambulatory referral to Neurology   Complete by: As directed    An appointment is requested in approximately: 2 weeks. Needs NCV?EMG as reccommended by neurohospitalists   Ambulatory referral to Physical Therapy   Complete by: As directed    Call MD for:  difficulty breathing, headache or visual disturbances   Complete by: As directed    Call MD for:  extreme fatigue   Complete by: As directed    Call MD for:  persistant dizziness or light-headedness   Complete by: As directed    Call MD for:  persistant nausea and vomiting   Complete by: As directed    Call MD for:  severe  uncontrolled pain   Complete by: As directed    Call MD for:  temperature >100.4   Complete by: As directed    Diet general   Complete by: As directed    Discharge instructions   Complete by: As directed    Please keep your appointments with the neurosurgeon.  Follow-up with primary care provider.  Please discuss with your PCP at next appointment if you can go up further on the dose of the Neurontin.  The following blood tests are still pending: ANA and HLA-B27.  Your PCP should be able to look them up.  Referral has been sent to neurology office for nerve conduction studies.  You were cared for by a hospitalist during your hospital stay. If you have any questions about your discharge medications or the care you received while you were in the hospital after you are discharged, you can call the unit and asked to speak with the hospitalist on call if the hospitalist that took care of you is not available. Once you are discharged, your primary care physician will handle any further medical issues. Please note that NO REFILLS for any discharge medications will be authorized once you are discharged, as it is imperative that you return to your primary care physician (or establish a relationship with a primary care physician if you do not have one) for your aftercare needs so that they can reassess your need for medications and monitor your lab values. If you do not have a primary care physician, you can call [phone] for a physician referral.   Increase activity slowly  Complete by: As directed         Allergies as of 12/21/2019      Reactions   Strawberry (diagnostic) Anaphylaxis   Other Hives   Cat Dander      Medication List    STOP taking these medications   ibuprofen 200 MG tablet Commonly known as: ADVIL     TAKE these medications   dexamethasone 2 MG tablet Commonly known as: DECADRON Take 3 tablets once daily for 2 days followed by 2 tablets once daily for 4 days followed by 1  tablet once daily for 4 days.   escitalopram 20 MG tablet Commonly known as: LEXAPRO Take 1 tablet (20 mg total) by mouth in the morning.   gabapentin 600 MG tablet Commonly known as: NEURONTIN Take 1 tablet twice daily in the morning and afternoon and then 2 tablets at nighttime before going to bed What changed:   how much to take  how to take this  when to take this  additional instructions   melatonin 5 MG Tabs Take 5 mg by mouth at bedtime.   naproxen 250 MG tablet Commonly known as: NAPROSYN Take 1 tablet (250 mg total) by mouth 2 (two) times daily with a meal.   oxyCODONE 5 MG immediate release tablet Commonly known as: Oxy IR/ROXICODONE Take 1 tablet (5 mg total) by mouth every 6 (six) hours as needed for up to 5 days for severe pain.   polyethylene glycol 17 g packet Commonly known as: MIRALAX / GLYCOLAX Take 17 g by mouth daily as needed for moderate constipation.   senna-docusate 8.6-50 MG tablet Commonly known as: Senokot-S Take 2 tablets by mouth at bedtime.   tamsulosin 0.4 MG Caps capsule Commonly known as: FLOMAX Take 1 capsule (0.4 mg total) by mouth at bedtime.   tizanidine 2 MG capsule Commonly known as: ZANAFLEX Take 1 capsule (2 mg total) by mouth 3 (three) times daily as needed for muscle spasms.            Durable Medical Equipment  (From admission, onward)         Start     Ordered   12/20/19 1103  For home use only DME Walker rolling  Once       Question Answer Comment  Walker: With Stigler Wheels   Patient needs a walker to treat with the following condition Chronic back pain      12/20/19 1103            Follow-up Information    Nicolette Bang, DO. Schedule an appointment as soon as possible for a visit in 1 week(s).   Specialty: Family Medicine Contact information: [place] [address] [phone]               TOTAL DISCHARGE TIME: 12 minutes  [place]  [facility] Pager on [email or web address]  12/21/2019, 10:29 AM

## 2019-12-20 NOTE — Evaluation (Signed)
Occupational Therapy Evaluation Patient Details Name: Lawrence Cantu MRN: [id number] DOB: [birth year] Today's Date: 12/20/2019    History of Present Illness Patient admitted to the hospital with severe neck pain and left hip pain. he had a fall down the satairs 5 days ago and is now having difficulty walking. He has a 5 year history of loweer back pain and has had lumbar and cervical fusion    Clinical Impression   Pt admitted with above. He demonstrates the below listed deficits and will benefit from continued OT to maximize safety and independence with BADLs.  Eval limited by pain.  Pt had just returned to bed from ambulating to and from bathroom.  Pt reports he is able to perform ADLs with set up, but it takes him an extended amount of time and effort.  He reports pain in shoulder, arms, back, and LEs.  Kinesiotape applied to shoulders, paraspinous muscles and lumbar area to hopefully decrease pain.  He has significantly reduced cervical and trunk ROM.  Will follow acutely.  Recommend OPPT at discharge.      Follow Up Recommendations  Other (comment) (OPPT)    Equipment Recommendations  None recommended by OT    Recommendations for Other Services       Precautions / Restrictions Precautions Precautions: Fall Restrictions Weight Bearing Restrictions: No      Mobility Bed Mobility Overal bed mobility: Modified Independent Bed Mobility: Rolling;Sidelying to Sit Rolling: Modified independent (Device/Increase time) Sidelying to sit: Modified independent (Device/Increase time)       General bed mobility comments: moves slowly and is guarded   Transfers Overall transfer level: Needs assistance Equipment used: Rolling walker (2 wheeled) Transfers: Sit to/from Stand Sit to Stand: Min guard         General transfer comment: Pt had just returned to bed and deferred further OOB due to pain     Balance Overall balance assessment: Needs assistance Sitting-balance support: Feet  supported;No upper extremity supported Sitting balance-Leahy Scale: Good     Standing balance support: During functional activity;Bilateral upper extremity supported Standing balance-Leahy Scale: Poor Standing balance comment: Relied heavily on RW for support.                           ADL either performed or assessed with clinical judgement   ADL Overall ADL's : Needs assistance/impaired                                       General ADL Comments: Pt deferred actual practice due to pain.  He reports he can perform ADLs with set up assist, but it is challenging due to pain, and requires increased time and rest breaks      Vision         Perception     Praxis      Pertinent Vitals/Pain Pain Assessment: Faces Faces Pain Scale: Hurts whole lot Pain Location: buttocks, UEs, neck, back worsened with movement Pain Descriptors / Indicators: Grimacing;Guarding;Burning;Shooting Pain Intervention(s): Monitored during session     Hand Dominance Right   Extremity/Trunk Assessment Upper Extremity Assessment Upper Extremity Assessment: Overall WFL for tasks assessed   Lower Extremity Assessment Lower Extremity Assessment: Defer to PT evaluation   Cervical / Trunk Assessment Cervical / Trunk Assessment: Lordotic;Other exceptions Cervical / Trunk Exceptions: pt with capital extension of his neck when seated .  Limited mobility  in trunk    Communication Communication Communication: No difficulties   Cognition Arousal/Alertness: Awake/alert Behavior During Therapy: Restless Overall Cognitive Status: Within Functional Limits for tasks assessed                                 General Comments: Pt understandably upset about lack of diagnosis with regards to symptoms/pain. Reports his days revolve around pain management.   General Comments  palpable tightness in cervical musculature with a dowager's hump.    Exercises Exercises: Other  exercises Other Exercises Other Exercises: Kinesiotape applied to paraspinals as well as shoulder and lumbar spine to reduce pain.     Shoulder Instructions      Home Living Family/patient expects to be discharged to:: Private residence Living Arrangements: Spouse/significant other Available Help at Discharge: Family Type of Home: House Home Access: Level entry     Home Layout: Two level Alternate Level Stairs-Number of Steps: 12 Alternate Level Stairs-Rails: Right               Additional Comments: can sleep on the first level idf needed.       Prior Functioning/Environment Level of Independence: Independent with assistive device(s)        Comments: uses a cane at times. Following the fall he has needed his cane more         OT Problem List: Decreased activity tolerance;Impaired balance (sitting and/or standing);Pain      OT Treatment/Interventions: Neuromuscular education;DME and/or AE instruction;Manual therapy;Therapeutic activities;Patient/family education;Balance training    OT Goals(Current goals can be found in the care plan section) Acute Rehab OT Goals Patient Stated Goal: to get rid of the pain  OT Goal Formulation: With patient/family Time For Goal Achievement: 01/03/20 Potential to Achieve Goals: Good  OT Frequency: Min 2X/week   Barriers to D/C:    increased pain        Co-evaluation              AM-PAC OT "6 Clicks" Daily Activity     Outcome Measure Help from another person eating meals?: None Help from another person taking care of personal grooming?: A Little Help from another person toileting, which includes using toliet, bedpan, or urinal?: A Little Help from another person bathing (including washing, rinsing, drying)?: A Little Help from another person to put on and taking off regular upper body clothing?: A Little Help from another person to put on and taking off regular lower body clothing?: A Little 6 Click Score: 19   End  of Session    Activity Tolerance: Patient limited by pain Patient left: in bed;with call bell/phone within reach;with family/visitor present  OT Visit Diagnosis: Pain Pain - Right/Left: Left Pain - part of body: Shoulder                Time: 0488-8916 OT Time Calculation (min): 50 min Charges:  OT General Charges $OT Visit: 1 Visit OT Evaluation $OT Eval Moderate Complexity: 1 Mod OT Treatments $Neuromuscular Re-education: 23-37 mins  Nilsa Nutting., OTR/L Acute Rehabilitation Services Pager [phone] Office [phone]   Lucille Passy M 12/20/2019, 5:40 PM

## 2019-12-20 NOTE — Progress Notes (Signed)
Physical Therapy Treatment Patient Details Name: Lawrence Cantu MRN: [id number] DOB: Mar 07, 1986 Today's Date: 12/20/2019    History of Present Illness Patient admitted to the hospital with severe neck pain and left hip pain. he had a fall down the satairs 5 days ago and is now having difficulty walking. He has a 5 year history of loweer back pain and has had lumbar and cervical fusion     PT Comments    Patient continues to report intractable pain at rest and worsened with exertion limiting mobility. Tolerated short distance ambulation with use of RW for support and min guard-Min A due to shooting pains causing partial knee buckling (increased flexion bilaterally). Pt and wife understandably upset about lack of diagnosis causing chronic pain. Pt reports having poor quality of life and his daily existence revolves around pain management. Pain is noted to be inconsistent and an every changing pattern with numbness in bil hands, palmer aspect today but also involves dorsum at times. Describes shooting pains into his UEs from his neck and sharp, stabbing pains in his back and buttocks. Pt reports he cannot find a position to relieve the pain. Neurology to follow up during this hospital admission. Will follow.   Follow Up Recommendations  Outpatient PT ([address] location for chronic pain management)     Equipment Recommendations  None recommended by PT (already has a RW he can borrow)    Recommendations for Other Services       Precautions / Restrictions Precautions Precautions: Fall Restrictions Weight Bearing Restrictions: No    Mobility  Bed Mobility Overal bed mobility: Needs Assistance Bed Mobility: Rolling;Sidelying to Sit Rolling: Modified independent (Device/Increase time) Sidelying to sit: Modified independent (Device/Increase time)       General bed mobility comments: Good demo of log roll technique.  Transfers Overall transfer level: Needs assistance Equipment used:  Rolling walker (2 wheeled) Transfers: Sit to/from Stand Sit to Stand: Min guard         General transfer comment: Min guard for safety. Slow to rise, stood from EOB x1. Not able to get fully upright.  Ambulation/Gait Ambulation/Gait assistance: Min assist;Min guard Gait Distance (Feet): 14 Feet Assistive device: Rolling walker (2 wheeled) Gait Pattern/deviations: Step-through pattern;Step-to pattern;Trunk flexed;Shuffle;Decreased step length - right;Decreased step length - left Gait velocity: decreased  Gait velocity interpretation: <1.31 ft/sec, indicative of household ambulator General Gait Details: Slow, guarded and shuffling like gait with decreased foot clearance bilaterally; hunched over into hip/trunk flexion. Episodes of sharp shooting pains in buttock resulting in increased knee flexion. Heavy reliance on UEs and RW.   Stairs             Wheelchair Mobility    Modified Rankin (Stroke Patients Only)       Balance Overall balance assessment: Needs assistance Sitting-balance support: Feet supported;No upper extremity supported Sitting balance-Leahy Scale: Good     Standing balance support: During functional activity;Bilateral upper extremity supported Standing balance-Leahy Scale: Poor Standing balance comment: Relied heavily on RW for support.                            Cognition Arousal/Alertness: Awake/alert Behavior During Therapy: Restless Overall Cognitive Status: Within Functional Limits for tasks assessed                                 General Comments: Pt understandably upset about lack of diagnosis  with regards to symptoms/pain. Reports his days revolve around pain management.      Exercises      General Comments General comments (skin integrity, edema, etc.): palpable tightness in cervical musculature with a dowager's hump.      Pertinent Vitals/Pain Pain Assessment: Faces Faces Pain Scale: Hurts worst Pain  Location: buttocks, UEs, neck, back worsened with movement Pain Descriptors / Indicators: Sharp;Shooting;Grimacing;Guarding Pain Intervention(s): Limited activity within patient's tolerance;Monitored during session;Repositioned    Home Living                      Prior Function            PT Goals (current goals can now be found in the care plan section) Progress towards PT goals: Progressing toward goals (slowly)    Frequency    Min 2X/week      PT Plan Current plan remains appropriate    Co-evaluation              AM-PAC PT "6 Clicks" Mobility   Outcome Measure  Help needed turning from your back to your side while in a flat bed without using bedrails?: None Help needed moving from lying on your back to sitting on the side of a flat bed without using bedrails?: None Help needed moving to and from a bed to a chair (including a wheelchair)?: A Little Help needed standing up from a chair using your arms (e.g., wheelchair or bedside chair)?: A Little Help needed to walk in hospital room?: A Little Help needed climbing 3-5 steps with a railing? : A Lot 6 Click Score: 19    End of Session Equipment Utilized During Treatment: Gait belt Activity Tolerance: Patient limited by pain Patient left: in bed;with call bell/phone within reach;with family/visitor present Nurse Communication: Mobility status PT Visit Diagnosis: Unsteadiness on feet (R26.81);Other abnormalities of gait and mobility (R26.89);Pain Pain - part of body:  (neck/back, UEs, buttocks)     Time: 9379-0240 PT Time Calculation (min) (ACUTE ONLY): 38 min  Charges:  $Therapeutic Activity: 8-22 mins $Self Care/Home Management: 8-22                     Vale Haven, PT, DPT Acute Rehabilitation Services Pager [phone] Office [phone]       Lawrence Cantu 12/20/2019, 4:02 PM

## 2019-12-20 NOTE — Consult Note (Signed)
Reason for Consult: neck/back pain Referring Physician: [name] is an 34 y.o. male.  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  He is s/p fusion.  He then developed failed lumbar surgery and severe low back pain radiating into both legs.  He was eventually implanted with a spinal cord stimulator which I believe is at T8-T10 level.  This relieved his radicular pain into his legs, but now has severe midline low back pain which is deep, sharp, and interferes with him taking any steps.  A few years after that, he developed severe neck pain shooting into his arms.  He has had C3-4 ACDF but it has continued to get worse.  MRI Brain +/- gad was normal.  MRI C-spine +/- shows the ACDF but no other stenosis.  He feels that his strength is good, but he is severely limited by pain.  He is tolerating the Neurontin well, and he takes narcotics but is not a big fan.  He has an appointment with pain clinic here 6/29 as he recently moved here.    Past Medical History:  Diagnosis Date  . Arthritis    [employer]rees[employer] 12/05/2019  . BPH (benign prostatic hyperplasia)   . Chronic pain   . DDD (degenerative disc disease), cervical   . DDD (degenerative disc disease), lumbar   . Depression    [employer]reesia 12/05/2019  . Hyperlipidemia   . Hypertension   . Neuromuscular disorder ([place])    [employer]ia 12/05/2019  . Post laminectomy syndrome   . Spinal stenosis     Past Surgical History:  Procedure Laterality Date  . CERVICAL FUSION    . LAMINECTOMY AND MICRODISCECTOMY LUMBAR SPINE    . SPINAL FUSION    . SPINE SURGERY N/A    [employer] 12/05/2019    Family History  Problem Relation Age of Onset  . Cancer Mother   . Depression Mother   . Depression Father   . Thyroid cancer Father   . Depression Sister   . Depression Brother   . Hypertension Maternal Grandmother   . Hyperlipidemia Maternal Grandmother    . Heart disease Maternal Grandmother   . Atrial fibrillation Maternal Grandmother   . Diabetes Maternal Grandmother     Social History:  reports that he has quit smoking. He has never used smokeless tobacco. He reports current alcohol use of about 2.0 standard drinks of alcohol per week. No history on file for drug use.  Allergies:  Allergies  Allergen Reactions  . Strawberry (Diagnostic) Anaphylaxis  . Other Hives    Cat Dander    Prior to Admission medications   Medication Sig Start Date End Date Taking? Authorizing Provider  escitalopram (LEXAPRO) 20 MG tablet Take 1 tablet (20 mg total) by mouth in the morning. 12/07/19  Yes Nicolette Bang, DO  ibuprofen (ADVIL) 200 MG tablet Take 800 mg by mouth daily as needed for moderate pain.    Yes [provider]  melatonin 5 MG TABS Take 5 mg by mouth at bedtime.   Yes [provider]  tamsulosin (FLOMAX) 0.4 MG CAPS capsule Take 1 capsule (0.4 mg total) by mouth at bedtime. 12/07/19  Yes Nicolette Bang, DO  dexamethasone (DECADRON) 2 MG tablet Take 3 tablets once daily for 2 days followed by 2 tablets once daily for 4 days followed by 1 tablet once daily for 4  days. 12/20/19   Bonnielee Haff, MD  gabapentin (NEURONTIN) 600 MG tablet Take 1 tablet twice daily in the morning and afternoon and then 2 tablets at nighttime before going to bed 12/20/19   Bonnielee Haff, MD  naproxen (NAPROSYN) 250 MG tablet Take 1 tablet (250 mg total) by mouth 2 (two) times daily with a meal. 12/20/19   Bonnielee Haff, MD  oxyCODONE (OXY IR/ROXICODONE) 5 MG immediate release tablet Take 1 tablet (5 mg total) by mouth every 6 (six) hours as needed for up to 5 days for severe pain. 12/20/19 12/25/19  Bonnielee Haff, MD  polyethylene glycol (MIRALAX / GLYCOLAX) 17 g packet Take 17 g by mouth daily as needed for moderate constipation. 12/20/19   Bonnielee Haff, MD  senna-docusate (SENOKOT-S) 8.6-50 MG tablet Take 2 tablets by mouth  at bedtime. 12/20/19   Bonnielee Haff, MD  tizanidine (ZANAFLEX) 2 MG capsule Take 1 capsule (2 mg total) by mouth 3 (three) times daily as needed for muscle spasms. 12/20/19   Bonnielee Haff, MD    Medications:  Scheduled: . dexamethasone  6 mg Oral Daily  . enoxaparin (LOVENOX) injection  40 mg Subcutaneous Q24H  . escitalopram  20 mg Oral q AM  . gabapentin  1,200 mg Oral QHS  . gabapentin  600 mg Oral 2 times per day  . melatonin  5 mg Oral QHS  . naproxen  250 mg Oral BID WC  . sodium chloride flush  3 mL Intravenous Q12H  . tamsulosin  0.4 mg Oral QHS    Results for orders placed or performed during the hospital encounter of 12/16/19 (from the past 48 hour(s))  Basic metabolic panel     Status: None   Collection Time: 12/19/19  9:35 AM  Result Value Ref Range   Sodium 138 135 - 145 mmol/L   Potassium 3.7 3.5 - 5.1 mmol/L   Chloride 105 98 - 111 mmol/L   CO2 23 22 - 32 mmol/L   Glucose, Bld 97 70 - 99 mg/dL    Comment: Glucose reference range applies only to samples taken after fasting for at least 8 hours.   BUN 13 6 - 20 mg/dL   Creatinine, Ser 0.91 0.61 - 1.24 mg/dL   Calcium 8.9 8.9 - 10.3 mg/dL   GFR calc non Af Amer >60 >60 mL/min   GFR calc Af Amer >60 >60 mL/min   Anion gap 10 5 - 15    Comment: Performed at [facility] [address] [place], [place] [address]    MR BRAIN W WO CONTRAST  Result Date: 12/19/2019 CLINICAL DATA:  Cervical radiculopathy. Possible demyelinating disease. EXAM: MRI HEAD WITHOUT AND WITH CONTRAST MRI CERVICAL SPINE WITH CONTRAST TECHNIQUE: Multiplanar, multiecho pulse sequences of the brain and surrounding structures were obtained without and with intravenous contrast. Multiplanar, multiecho pulse sequences of the cervical spine, to include the craniocervical junction and cervicothoracic junction, were obtained following administration of intravenous contrast material. CONTRAST:  1m GADAVIST GADOBUTROL 1 MMOL/ML IV SOLN  COMPARISON:  12/17/2019 FINDINGS: MRI HEAD FINDINGS Brain: No acute infarct, acute hemorrhage or extra-axial collection. Normal white matter signal. Normal volume of CSF spaces. No chronic microhemorrhage. Normal midline structures. Cavum velum interpositum cyst. No abnormal enhancement. Vascular: Normal flow voids. Skull and upper cervical spine: Normal marrow signal. Sinuses/Orbits: Negative. Other: None. MRI CERVICAL SPINE FINDINGS Alignment: Normal Vertebrae: C2-3 fusion. Cord: No abnormal contrast enhancement. Posterior Fossa, vertebral arteries, paraspinal tissues: Negative Disc levels: No spinal canal  stenosis. IMPRESSION: Normal MRI of the brain and cervical spine. No evidence of demyelinating disease. Electronically Signed   By: Ulyses Jarred M.D.   On: 12/19/2019 21:35   MR CERVICAL SPINE W CONTRAST  Result Date: 12/19/2019 CLINICAL DATA:  Cervical radiculopathy. Possible demyelinating disease. EXAM: MRI HEAD WITHOUT AND WITH CONTRAST MRI CERVICAL SPINE WITH CONTRAST TECHNIQUE: Multiplanar, multiecho pulse sequences of the brain and surrounding structures were obtained without and with intravenous contrast. Multiplanar, multiecho pulse sequences of the cervical spine, to include the craniocervical junction and cervicothoracic junction, were obtained following administration of intravenous contrast material. CONTRAST:  33m GADAVIST GADOBUTROL 1 MMOL/ML IV SOLN COMPARISON:  12/17/2019 FINDINGS: MRI HEAD FINDINGS Brain: No acute infarct, acute hemorrhage or extra-axial collection. Normal white matter signal. Normal volume of CSF spaces. No chronic microhemorrhage. Normal midline structures. Cavum velum interpositum cyst. No abnormal enhancement. Vascular: Normal flow voids. Skull and upper cervical spine: Normal marrow signal. Sinuses/Orbits: Negative. Other: None. MRI CERVICAL SPINE FINDINGS Alignment: Normal Vertebrae: C2-3 fusion. Cord: No abnormal contrast enhancement. Posterior Fossa, vertebral  arteries, paraspinal tissues: Negative Disc levels: No spinal canal stenosis. IMPRESSION: Normal MRI of the brain and cervical spine. No evidence of demyelinating disease. Electronically Signed   By: KUlyses JarredM.D.   On: 12/19/2019 21:35    ROS Blood pressure (!) 142/87, pulse 80, temperature 98.9 F (37.2 C), temperature source Oral, resp. rate 16, height 5' 11"  (1.803 m), weight 102.1 kg, SpO2 91 %. Neurologic Examination: Awake, alert, oriented. PERL, EOMI.  Face symmetrical. Tongue midline. Strength 5/5 BUE and BLE.  Coord FTN is intact bilaterally. Gait- able to stand with assistance but cannot really take any steps due to severe pain which originates in his mid low back.  There is no pain to palpation in the neck or low back, but he says there are deep electrical sharp pains that come and go.    Assessment/Plan:  Failed lumbar laminectomy and cervical fusion surgeries.    1. I recommend that the pain physician move the spinal cord stimulator to the upper cervical spine area so as to help relieve some of the neck pain as well. He will discuss at his next appointment.  2. Slowly titrate Neurontin over the next week or two to 1200 mg tid.  3. EMG/NCS as outpatient to see if there is any source of nerve damage or pain outside of the spine present.    4. ANA, ESR, CRP, CK, HLA-B27 to see if we are missing any other autoimmune condition that may be contributing to his severe pain.  5. No evidence of demyelinating disease in the brain or spinal cord and I reassured them.    ERogue Jury MD 12/20/2019, 5:54 PM

## 2019-12-20 NOTE — Progress Notes (Signed)
Notified by PT that patient was barely able to do anything with them and in a lot of pain. Discussed with patient's wife. Informed her of MRI findings. No evidence for demyelinating process. Se is very worried that patient is unable to walk much and in so much pain. This has been progressively getting worse over past several months. Not an acute change. Likely still related to underlying spinal issues. May not be unreasonable to get neurology opinion. Discussed with Dr. Nicholas Lose who agrees to see the patient. Will cancel discharge for now.  Lawrence Cantu 4:14 PM

## 2019-12-21 ENCOUNTER — Encounter: Payer: Self-pay | Admitting: Neurology

## 2019-12-21 ENCOUNTER — Other Ambulatory Visit: Payer: Self-pay

## 2019-12-21 DIAGNOSIS — R202 Paresthesia of skin: Secondary | ICD-10-CM

## 2019-12-21 NOTE — Progress Notes (Signed)
Please see discharge summary and progress note from yesterday.  Patient requested to be seen by neurology.  Long discussion with him and his wife yesterday.  Neurology was subsequently consulted.  Appreciate Dr. Alessandra Bevels input.  ESR and CRP not elevated.  CK level is normal.  ANA and HLA-B27 is pending.  At this time there is no further need for inpatient hospitalization.  Discussed with patient this morning in detail.  He is appreciative of the care given.  Medically okay for discharge today.  Referral has been sent to neurology to arrange for nerve conduction velocity/EMG.  PCP can consider further increase in the dose of Neurontin at follow-up.  Bonnielee Haff 12/21/2019

## 2019-12-21 NOTE — Progress Notes (Signed)
Patient discharging home. Discharge instructions explained to patient and he verbalized understanding. Took all personal belongings. No further questions or concerns voiced.  

## 2019-12-21 NOTE — Progress Notes (Addendum)
Occupational Therapy Treatment Patient Details Name: Lawrence Cantu MRN: [id number] DOB: 19-May-1986 Today's Date: 12/21/2019    History of present illness Patient admitted to the hospital with severe neck pain and left hip pain. he had a fall down the satairs 5 days ago and is now having difficulty walking. He has a 5 year history of loweer back pain and has had lumbar and cervical fusion    OT comments  Pt reports no improvement in pain from the kinesiotape.  He was instructed in thoracic stretching, piriformis stretch, gentle trunk rotation in supine, as well as bridging with focus on core activation and controlling his breath with exercise.  He was able to return demonstration of all.  [facility] written HEP provided   Follow Up Recommendations  Other (comment) (OPPT)    Equipment Recommendations  None recommended by OT    Recommendations for Other Services      Precautions / Restrictions Restrictions Weight Bearing Restrictions: No       Mobility Bed Mobility                  Transfers                      Balance                                           ADL either performed or assessed with clinical judgement   ADL                                               Vision       Perception     Praxis      Cognition Arousal/Alertness: Awake/alert Behavior During Therapy: Restless Overall Cognitive Status: Within Functional Limits for tasks assessed                                          Exercises Other Exercises Other Exercises: Pt reports no change in pain from kinesiotape.    Other Exercises: Pt instructed in piriformis stretch bil. with empahasis on breath control Other Exercises: Pt instructed to bend knees with feet on bed and perform gentle rotation to Lt and Rt within pain tolerance for 5 breaths - he was able to demonstrate understanding  Other Exercises: Thoracic stretch performed  with towel roll under apect of curve of rib cage.  Pt lying on it and tolerated for 15 mins.  Instructed him he can also lift UEs overhead in conjunction with breathing to increase his stretch.  Other Exercises: Pt instructed in bridging (small excursion of movement with focus on breath control with movement and activating his core    Shoulder Instructions       General Comments      Pertinent Vitals/ Pain       Pain Assessment: Faces Faces Pain Scale: Hurts whole lot Pain Location: buttocks, UEs, neck, back worsened with movement Pain Descriptors / Indicators: Grimacing;Guarding;Burning;Shooting Pain Intervention(s): Monitored during session;Repositioned  Home Living  Prior Functioning/Environment              Frequency  Min 2X/week        Progress Toward Goals  OT Goals(current goals can now be found in the care plan section)  Progress towards OT goals: Progressing toward goals  ADL Goals Pt/caregiver will Perform Home Exercise Program: With written HEP provided;Independently Additional ADL Goal #1: Pt will report a subjective improvement in pain to 6/10  Plan      Co-evaluation                 AM-PAC OT "6 Clicks" Daily Activity     Outcome Measure   Help from another person eating meals?: None Help from another person taking care of personal grooming?: A Little Help from another person toileting, which includes using toliet, bedpan, or urinal?: A Little Help from another person bathing (including washing, rinsing, drying)?: A Little Help from another person to put on and taking off regular upper body clothing?: A Little Help from another person to put on and taking off regular lower body clothing?: A Little 6 Click Score: 19    End of Session    OT Visit Diagnosis: Pain Pain - Right/Left: Right Pain - part of body: Shoulder;Hip (beck, neck)   Activity Tolerance Patient tolerated  treatment well   Patient Left in bed;with call bell/phone within reach   Nurse Communication          Time: 3267-1245 OT Time Calculation (min): 36 min  Charges: OT General Charges $OT Visit: 1 Visit OT Treatments $Neuromuscular Re-education: 23-37 mins  Nilsa Nutting., OTR/L Acute Rehabilitation Services Pager [phone] Office [phone]    Lawrence Cantu M 12/21/2019, 10:20 AM

## 2019-12-22 LAB — ANTINUCLEAR ANTIBODIES, IFA: ANA Ab, IFA: NEGATIVE

## 2019-12-23 ENCOUNTER — Encounter: Payer: Self-pay | Admitting: Internal Medicine

## 2019-12-23 ENCOUNTER — Telehealth (INDEPENDENT_AMBULATORY_CARE_PROVIDER_SITE_OTHER): Payer: Medicare Other | Admitting: Internal Medicine

## 2019-12-23 DIAGNOSIS — M5412 Radiculopathy, cervical region: Secondary | ICD-10-CM

## 2019-12-23 DIAGNOSIS — S343XXS Injury of cauda equina, sequela: Secondary | ICD-10-CM

## 2019-12-23 DIAGNOSIS — K625 Hemorrhage of anus and rectum: Secondary | ICD-10-CM | POA: Diagnosis not present

## 2019-12-23 MED ORDER — TIZANIDINE HCL 2 MG PO TABS: 2.0000 mg | ORAL_TABLET | Freq: Three times a day (TID) | ORAL | 0 refills | Status: AC | PRN

## 2019-12-23 NOTE — Progress Notes (Signed)
Virtual Visit via Telephone Note  I connected with Lawrence Cantu, on 12/23/2019 at 11:08 AM by telephone due to the COVID-19 pandemic and verified that I am speaking with the correct person using two identifiers.   Consent: I discussed the limitations, risks, security and privacy concerns of performing an evaluation and management service by telephone and the availability of in person appointments. I also discussed with the patient that there may be a patient responsible charge related to this service. The patient expressed understanding and agreed to proceed.   Location of Patient: Home   Location of Provider: Clinic    Persons participating in Telemedicine visit: Tamer Baughman [facility] Dr. Juleen China    History of Present Illness: Patient was hospitalized from 6/11-6/14 for acute on chronic neck pain with back pain. Patient has a prior history of cauda equina syndrome. Reports since returning home he has had continued pain and limited mobility. He has a follow up with neurosurgery 6/29. Considering movement of spinal stimulator.   Patient reporting that he has bright red blood per rectum. He is concerned it is related to new NSAID use. He does have some pain in the epigastric region. He denies rectal pain. He has been having frequent bowel movements due to use of stool softeners with narcotic use.   RECOMMENDATIONS FOR OUTPATIENT FOLLOW UP: 1. Patient has an appointment with neurosurgery on June 29. 2. Patient is to follow-up with PCP in 1 week 3. Outpatient referral to [facility] neurology for NCV/EMG 4. ANA and HLA-B27 results are still pending 5. PCP to consider increasing Neurontin gradually to a max dose of 1200 mg 3 times a day.  HOSPITAL COURSE:   Acute on chronic neck pain with back pain with evidence for cervical radiculitis/spinal cord stimulator Patient was evaluated in the emergency department. CT scan and MRI was done. Patient seen by neurosurgery. No role for any  kind of procedures at this time. Pain control. NSAIDs. Steroid taper as recommended by neurosurgery. PT evaluation. Does not have any red flag symptoms.  Due toconcern for demyelinating disease process MRI brain and MRI cervical spine was done with contrast.  No demyelinating lesions noted.  Patient reassured. Patient apparently has an appointment with neurosurgery in their office on June 29. Seen by PT.  Rolling walker has been ordered.  Outpatient PT.  Chronic pain Continue his pain medications as before. He is noted to be on oxycodone and gabapentin.  PDMP data was reviewed.  No prescriptions noted recently.  He will be given a prescription for 20 tablets of oxycodone.  Dose of gabapentin increased from 652m 3 times daily to 609mtwice a day along with 12008mt nighttime.  History of fall This occurred about 1 week ago. Has not sustained any orthopedic injuries.  History of depression and anxiety Continue Lexapro  History of BPH Continue Flomax.  Obesity Estimated body mass index is 31.38 kg/m as calculated from the following: Height as of this encounter: 5' 11" (1.803 m). Weight as of this encounter: 102.1 kg.   Past Medical History:  Diagnosis Date  . Arthritis    [employer] 12/05/2019  . BPH (benign prostatic hyperplasia)   . Chronic pain   . DDD (degenerative disc disease), cervical   . DDD (degenerative disc disease), lumbar   . Depression    [employer] 12/05/2019  . Hyperlipidemia   . Hypertension   . [place]romuscular disorder ([place]  [facility]reesia 12/05/2019  . Post laminectomy syndrome   . Spinal stenosis  Allergies  Allergen Reactions  . Strawberry (Diagnostic) Anaphylaxis  . Other Hives    Cat Dander    Current Outpatient Medications on File Prior to Visit  Medication Sig Dispense Refill  . dexamethasone (DECADRON) 2 MG tablet Take 3 tablets once daily for 2 days followed by 2 tablets once daily for 4 days followed by 1 tablet once daily for 4  days. 18 tablet 0  . escitalopram (LEXAPRO) 20 MG tablet Take 1 tablet (20 mg total) by mouth in the morning. 30 tablet 2  . gabapentin (NEURONTIN) 600 MG tablet Take 1 tablet twice daily in the morning and afternoon and then 2 tablets at nighttime before going to bed 120 tablet 1  . melatonin 5 MG TABS Take 5 mg by mouth at bedtime.    . naproxen (NAPROSYN) 250 MG tablet Take 1 tablet (250 mg total) by mouth 2 (two) times daily with a meal. 60 tablet 1  . oxyCODONE (OXY IR/ROXICODONE) 5 MG immediate release tablet Take 1 tablet (5 mg total) by mouth every 6 (six) hours as needed for up to 5 days for severe pain. 20 tablet 0  . polyethylene glycol (MIRALAX / GLYCOLAX) 17 g packet Take 17 g by mouth daily as needed for moderate constipation. 14 each 0  . senna-docusate (SENOKOT-S) 8.6-50 MG tablet Take 2 tablets by mouth at bedtime. 60 tablet 0  . tamsulosin (FLOMAX) 0.4 MG CAPS capsule Take 1 capsule (0.4 mg total) by mouth at bedtime. 30 capsule 2  . tizanidine (ZANAFLEX) 2 MG capsule Take 1 capsule (2 mg total) by mouth 3 (three) times daily as needed for muscle spasms. 30 capsule 0   No current facility-administered medications on file prior to visit.    Observations/Objective: NAD. Speaking clearly.  Work of breathing normal.  Alert and oriented. Mood appropriate.   Assessment and Plan: 1. Cervical radiculitis 2. Cauda equina spinal cord injury, sequela Patient is following up with neurosurgery to see if spinal cord stimulator relocation may be of some benefit. Unfortunately, patient is extremely limited in mobility. Will refer to PT and PMR to see what assistance these specialties could provide.  - tiZANidine (ZANAFLEX) 2 MG tablet; Take 1 tablet (2 mg total) by mouth 3 (three) times daily as needed for muscle spasms.  Dispense: 30 tablet; Refill: 0 - Ambulatory referral to Physical Therapy - Ambulatory referral to Physical Medicine Rehab  3. Bright red blood per rectum May be related  to NSAID use especially given associated epigastric pain. However, would anticipate melena to be more likely presenting symptom. Hold NSAID to see if resolves. If persists, would need rectal exam to evaluate for fissures, hemorrhoids, etc as etiology.    Follow Up Instructions: PRN and for chronic medical care    I discussed the assessment and treatment plan with the patient. The patient was provided an opportunity to ask questions and all were answered. The patient agreed with the plan and demonstrated an understanding of the instructions.   The patient was advised to call back or seek an in-person evaluation if the symptoms worsen or if the condition fails to improve as anticipated.     I provided 28 minutes total of non-face-to-face time during this encounter including median intraservice time, reviewing previous notes, investigations, ordering medications, medical decision making, coordinating care and patient verbalized understanding at the end of the visit.    Catherine Wallace, D.O. [facility] [place]  12/23/2019, 11:08 AM 

## 2019-12-24 LAB — HLA-B27 ANTIGEN: HLA-B27: POSITIVE

## 2019-12-27 ENCOUNTER — Other Ambulatory Visit: Payer: Self-pay | Admitting: Internal Medicine

## 2019-12-27 DIAGNOSIS — Z1589 Genetic susceptibility to other disease: Secondary | ICD-10-CM | POA: Insufficient documentation

## 2019-12-27 NOTE — Progress Notes (Signed)
Patient notified of results & recommendations. Expressed understanding. Also sent result notes via MyChart.

## 2020-01-04 DIAGNOSIS — M456 Ankylosing spondylitis lumbar region: Secondary | ICD-10-CM | POA: Insufficient documentation

## 2020-01-04 DIAGNOSIS — M546 Pain in thoracic spine: Secondary | ICD-10-CM | POA: Insufficient documentation

## 2020-01-05 ENCOUNTER — Ambulatory Visit: Payer: Medicare Other | Admitting: Physical Therapy

## 2020-01-05 ENCOUNTER — Telehealth: Payer: Self-pay | Admitting: Internal Medicine

## 2020-01-05 ENCOUNTER — Other Ambulatory Visit: Payer: Self-pay | Admitting: Internal Medicine

## 2020-01-05 DIAGNOSIS — M5412 Radiculopathy, cervical region: Secondary | ICD-10-CM

## 2020-01-05 DIAGNOSIS — G894 Chronic pain syndrome: Secondary | ICD-10-CM

## 2020-01-05 DIAGNOSIS — M542 Cervicalgia: Secondary | ICD-10-CM

## 2020-01-05 DIAGNOSIS — Z1589 Genetic susceptibility to other disease: Secondary | ICD-10-CM

## 2020-01-05 NOTE — Telephone Encounter (Signed)
Pt want to go to [facility] for pain management baclifin pump

## 2020-01-05 NOTE — Telephone Encounter (Signed)
Referral placed.   Marcy Siren, D.O. [facility] at [facility]  01/05/2020, 7:53 PM

## 2020-01-12 ENCOUNTER — Other Ambulatory Visit: Payer: Self-pay

## 2020-01-12 DIAGNOSIS — F32A Depression, unspecified: Secondary | ICD-10-CM

## 2020-01-12 MED ORDER — ESCITALOPRAM OXALATE 20 MG PO TABS: 20.0000 mg | ORAL_TABLET | Freq: Every morning | ORAL | 0 refills | Status: DC

## 2020-02-07 ENCOUNTER — Ambulatory Visit: Payer: Medicare Other | Admitting: Physical Therapy

## 2020-02-07 DIAGNOSIS — Z0289 Encounter for other administrative examinations: Secondary | ICD-10-CM

## 2020-02-23 ENCOUNTER — Ambulatory Visit (INDEPENDENT_AMBULATORY_CARE_PROVIDER_SITE_OTHER): Payer: Medicare Other | Admitting: Neurology

## 2020-02-23 ENCOUNTER — Other Ambulatory Visit: Payer: Self-pay

## 2020-02-23 DIAGNOSIS — R202 Paresthesia of skin: Secondary | ICD-10-CM | POA: Diagnosis not present

## 2020-02-23 NOTE — Procedures (Signed)
[facility]  [address] [place], [address]  [place], [place] [address] Tel: [phone] Fax:  [phone] Test Date:  02/23/2020  Patient: Lawrence Cantu DOB: 1985-08-28 Physician: Nita Sickle, DO  Sex: Male Height: 5\' 11"  Ref Phys: , MD  ID#: [id number] Shipper Temp: 33.0C Technician:    Patient Complaints: This is a 34 year old man with history of cauda equina syndrome s/p decompression referred for evaluation of bilateral leg pain and paresthesias.  NCV & EMG Findings: Electrodiagnostic testing of the right lower extremity and additional studies of the left shows: 1. Bilateral sural and superficial peroneal sensory responses are within normal limits. 2. Bilateral peroneal and tibial motor responses are within normal limits. 3. Bilateral tibial H reflex studies are within normal limits. 4. There is no evidence of active or chronic motor axonal changes affecting any of the tested muscles.  Motor unit configuration and recruitment pattern is within normal limits.   Impression: This is a normal study of the lower extremities.  In particular, there is no evidence of a sensorimotor polyneuropathy or lumbosacral radiculopathy.   ___________________________ 20, DO    Nerve Conduction Studies Anti Sensory Summary Table   Stim Site NR Peak (ms) Norm Peak (ms) P-T Amp (V) Norm P-T Amp  Left Sup Peroneal Anti Sensory (Ant Lat Mall)  33C  12 cm    2.2 <4.5 15.9 >5  Right Sup Peroneal Anti Sensory (Ant Lat Mall)  33C  12 cm    2.3 <4.5 13.5 >5  Left Sural Anti Sensory (Lat Mall)  33C  Calf    2.8 <4.5 25.0 >5  Right Sural Anti Sensory (Lat Mall)  33C  Calf    2.3 <4.5 28.4 >5   Motor Summary Table   Stim Site NR Onset (ms) Norm Onset (ms) O-P Amp (mV) Norm O-P Amp Site1 Site2 Delta-0 (ms) Dist (cm) Vel (m/s) Norm Vel (m/s)  Left Peroneal Motor (Ext Dig Brev)  33C  Ankle    3.7 <5.5 6.1 >3 B Fib Ankle 7.4 37.0 50 >40  B Fib    11.1  5.6  Poplt B Fib  1.6 9.0 56 >40  Poplt    12.7  5.6         Right Peroneal Motor (Ext Dig Brev)  33C  Ankle    3.6 <5.5 6.0 >3 B Fib Ankle 7.7 39.0 51 >40  B Fib    11.3  5.8  Poplt B Fib 1.5 9.0 60 >40  Poplt    12.8  5.2         Left Tibial Motor (Abd Hall Brev)  33C  Ankle    3.2 <6.0 11.7 >8 Knee Ankle 8.6 43.0 50 >40  Knee    11.8  8.2         Right Tibial Motor (Abd Hall Brev)  33C  Ankle    2.9 <6.0 12.4 >8 Knee Ankle 9.4 43.0 46 >40  Knee    12.3  9.9          H Reflex Studies   NR H-Lat (ms) Lat Norm (ms) L-R H-Lat (ms)  Left Tibial (Gastroc)  33C     31.43 <35 1.22  Right Tibial (Gastroc)  33C     32.65 <35 1.22   EMG   Side Muscle Ins Act Fibs Psw Fasc Number Recrt Dur Dur. Amp Amp. Poly Poly. Comment  Left AntTibialis Nml Nml Nml Nml Nml Nml Nml Nml Nml Nml Nml Nml N/A  Left Flex Dig Long Nml Nml Nml Nml Nml Nml Nml Nml Nml Nml Nml Nml N/A  Left Gastroc Nml Nml Nml Nml Nml Nml Nml Nml Nml Nml Nml Nml N/A  Left RectFemoris Nml Nml Nml Nml Nml Nml Nml Nml Nml Nml Nml Nml N/A  Left GluteusMed Nml Nml Nml Nml Nml Nml Nml Nml Nml Nml Nml Nml N/A  Right AntTibialis Nml Nml Nml Nml Nml Nml Nml Nml Nml Nml Nml Nml N/A  Right Gastroc Nml Nml Nml Nml Nml Nml Nml Nml Nml Nml Nml Nml N/A  Right Flex Dig Long Nml Nml Nml Nml Nml Nml Nml Nml Nml Nml Nml Nml N/A  Right RectFemoris Nml Nml Nml Nml Nml Nml Nml Nml Nml Nml Nml Nml N/A  Right GluteusMed Nml Nml Nml Nml Nml Nml Nml Nml Nml Nml Nml Nml N/A      Waveforms:

## 2020-03-10 ENCOUNTER — Other Ambulatory Visit: Payer: Self-pay

## 2020-03-10 DIAGNOSIS — S343XXS Injury of cauda equina, sequela: Secondary | ICD-10-CM

## 2020-03-10 MED ORDER — TAMSULOSIN HCL 0.4 MG PO CAPS: 0.4000 mg | ORAL_CAPSULE | Freq: Every day | ORAL | 0 refills | Status: DC

## 2020-03-21 ENCOUNTER — Other Ambulatory Visit: Payer: Self-pay

## 2020-03-21 ENCOUNTER — Ambulatory Visit (HOSPITAL_COMMUNITY)
Admission: EM | Admit: 2020-03-21 | Discharge: 2020-03-21 | Disposition: A | Discharge status: Home / Self Care | Payer: Medicare Other

## 2020-03-21 NOTE — ED Notes (Signed)
Attempted to call patient for triage, no answer in lobby

## 2020-03-21 NOTE — ED Notes (Signed)
Per patient access, patient left prior to being registered.

## 2020-03-30 ENCOUNTER — Encounter (HOSPITAL_COMMUNITY): Payer: Self-pay | Admitting: Emergency Medicine

## 2020-03-30 ENCOUNTER — Other Ambulatory Visit: Payer: Self-pay

## 2020-03-30 ENCOUNTER — Emergency Department (HOSPITAL_COMMUNITY)
Admission: EM | Admit: 2020-03-30 | Discharge: 2020-03-30 | Disposition: A | Discharge status: Home / Self Care | Payer: Medicare Other | Attending: Emergency Medicine | Admitting: Emergency Medicine

## 2020-03-30 ENCOUNTER — Emergency Department (HOSPITAL_COMMUNITY): Payer: Medicare Other

## 2020-03-30 DIAGNOSIS — R599 Enlarged lymph nodes, unspecified: Secondary | ICD-10-CM | POA: Insufficient documentation

## 2020-03-30 DIAGNOSIS — R0789 Other chest pain: Secondary | ICD-10-CM | POA: Diagnosis present

## 2020-03-30 DIAGNOSIS — R0602 Shortness of breath: Secondary | ICD-10-CM | POA: Insufficient documentation

## 2020-03-30 DIAGNOSIS — R590 Localized enlarged lymph nodes: Secondary | ICD-10-CM

## 2020-03-30 DIAGNOSIS — R911 Solitary pulmonary nodule: Secondary | ICD-10-CM | POA: Diagnosis not present

## 2020-03-30 DIAGNOSIS — Z87891 Personal history of nicotine dependence: Secondary | ICD-10-CM | POA: Insufficient documentation

## 2020-03-30 DIAGNOSIS — I10 Essential (primary) hypertension: Secondary | ICD-10-CM | POA: Insufficient documentation

## 2020-03-30 LAB — BRAIN NATRIURETIC PEPTIDE: B Natriuretic Peptide: 20 pg/mL (ref 0.0–100.0)

## 2020-03-30 LAB — BASIC METABOLIC PANEL
Anion gap: 9 (ref 5–15)
BUN: 17 mg/dL (ref 6–20)
CO2: 25 mmol/L (ref 22–32)
Calcium: 9.1 mg/dL (ref 8.9–10.3)
Chloride: 105 mmol/L (ref 98–111)
Creatinine, Ser: 0.81 mg/dL (ref 0.61–1.24)
GFR calc Af Amer: >60 mL/min (ref >60)
GFR calc non Af Amer: >60 mL/min (ref >60)
Glucose, Bld: 99 mg/dL (ref 70–99)
Potassium: 3.7 mmol/L (ref 3.5–5.1)
Sodium: 139 mmol/L (ref 135–145)

## 2020-03-30 LAB — TROPONIN I (HIGH SENSITIVITY)
Troponin I (High Sensitivity): 5 ng/L (ref <18)
Troponin I (High Sensitivity): 7 ng/L (ref <18)

## 2020-03-30 LAB — CBC
HCT: 43 % (ref 39.0–52.0)
Hemoglobin: 14.6 g/dL (ref 13.0–17.0)
MCH: 27.8 pg (ref 26.0–34.0)
MCHC: 34 g/dL (ref 30.0–36.0)
MCV: 81.7 fL (ref 80.0–100.0)
Platelets: 260 10*3/uL (ref 150–400)
RBC: 5.26 MIL/uL (ref 4.22–5.81)
RDW: 12.5 % (ref 11.5–15.5)
WBC: 6.7 10*3/uL (ref 4.0–10.5)
nRBC: 0 % (ref 0.0–0.2)

## 2020-03-30 MED ORDER — IOHEXOL 350 MG/ML SOLN
75.0000 mL | Freq: Once | INTRAVENOUS | Status: AC | PRN
  Administered 2020-03-30: 75 mL via INTRAVENOUS

## 2020-03-30 MED ORDER — SODIUM CHLORIDE 0.9 % IV BOLUS
500.0000 mL | Freq: Once | INTRAVENOUS | Status: AC
  Administered 2020-03-30: 500 mL via INTRAVENOUS

## 2020-03-30 MED ORDER — MORPHINE SULFATE (PF) 2 MG/ML IV SOLN
2.0000 mg | Freq: Once | INTRAVENOUS | Status: AC
  Administered 2020-03-30: 2 mg via INTRAVENOUS
  Filled 2020-03-30: qty 1

## 2020-03-30 NOTE — ED Triage Notes (Signed)
C/o chest pain and SOB x 1 week.  Negative COVID test 4 days ago.  Denies nausea and vomiting.

## 2020-03-30 NOTE — ED Notes (Signed)
Pt was ambulated without any injuries or falls, O2 stats remained from 94%-100%.

## 2020-03-30 NOTE — Discharge Instructions (Addendum)
At this time there does not appear to be the presence of an emergent medical condition, however there is always the potential for conditions to change. Please read and follow the below instructions.  Please return to the Emergency Department immediately for any new or worsening symptoms. Please be sure to follow up with your Primary Care Provider within one week regarding your visit today; please call their office to schedule an appointment even if you are feeling better for a follow-up visit. You have been given referral to [facility] pulmonology, please call them tomorrow for follow-up regarding the abnormality seen on your CT scan. Please also call your primary care provider and your rheumatologist tomorrow morning to inform them of CT scan findings and for need to follow-up.  Your primary care provider can refer you to an oncologist for follow-up.  Your information is also been forwarded to [facility] health oncologist Dr. Truett Perna who you may follow-up with as well when you discuss with your primary care provider. Your Covid test and flu test are pending.  Please follow-up on those results on your MyChart account, they should be available by tomorrow.  Discussed the results with your primary care provider at your follow-up visit.  Go to the nearest Emergency Department immediately if: You have fever or chills Fluid leaking from an enlarged lymph gland. Severe pain. Chest pain. Shortness of breat You have a cough that gets worse, or you cough up blood. You have very bad (severe) pain in your belly (abdomen). You pass out (faint). You have either of these for no clear reason: Sudden chest discomfort. Sudden discomfort in your arms, back, neck, or jaw. You have shortness of breath at any time. You suddenly start to sweat, or your skin gets clammy. You feel sick to your stomach (nauseous). You throw up (vomit). You suddenly feel lightheaded or dizzy. You feel very weak or tired. Your heart starts  to beat fast, or it feels like it is skipping beats. You have any new/concerning or worsening of symptoms   Please read the additional information packets attached to your discharge summary.  Do not take your medicine if  develop an itchy rash, swelling in your mouth or lips, or difficulty breathing; call 911 and seek immediate emergency medical attention if this occurs.  You may review your lab tests and imaging results in their entirety on your MyChart account.  Please discuss all results of fully with your primary care provider and other specialist at your follow-up visit.  Note: Portions of this text may have been transcribed using voice recognition software. Every effort was made to ensure accuracy; however, inadvertent computerized transcription errors may still be present.

## 2020-03-30 NOTE — ED Provider Notes (Signed)
[facility] EMERGENCY DEPARTMENT Provider Note   CSN: [id number] Arrival date & time: 03/30/20  1407     History Chief Complaint  Patient presents with  . Chest Pain  . Shortness of Breath    Lawrence Cantu is a 34 y.o. male history of ankylosing spondylitis, degenerative disc disease, hypertension, hyperlipidemia.  Patient presents today for shortness of breath and chest pain.  He noted shortness of breath around 1 week ago describes difficulty catching a full breath and worsening with exertion.  Associated with a central chest tightness moderate intensity constant worsened with exertion and deep breathing no alleviating factors, no radiation of pain.  He reports he called his primary care doctor today, they were concerned for possible blood clot and sent him to this ER for evaluation.  Patient reports that he has been bedbound for around 7 weeks until around 3 weeks ago following back surgery.  He denies history of blood clot, extremity swelling/color change, cough/hemoptysis, fever/chills, dental pain, nausea/vomiting, dysuria/hematuria or any additional concerns.  HPI     Past Medical History:  Diagnosis Date  . Arthritis    Phreesia 12/05/2019  . BPH (benign prostatic hyperplasia)   . Chronic pain   . DDD (degenerative disc disease), cervical   . DDD (degenerative disc disease), lumbar   . Depression    Phreesia 12/05/2019  . Hyperlipidemia   . Hypertension   . Neuromuscular disorder ([place])    Phreesia 12/05/2019  . Post laminectomy syndrome   . Spinal stenosis     Patient Active Problem List   Diagnosis Date Noted  . 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 Cervical radiculitis 12/18/2019  . Chronic pain syndrome 12/18/2019  . Neck pain 12/17/2019    Past Surgical History:  Procedure Laterality Date  . CERVICAL FUSION    . LAMINECTOMY AND MICRODISCECTOMY LUMBAR SPINE    . SPINAL FUSION    . SPINE SURGERY N/A    Phreesia 12/05/2019       Family  History  Problem Relation Age of Onset  . Cancer Mother   . Depression Mother   . Depression Father   . Thyroid cancer Father   . Depression Sister   . Depression Brother   . Hypertension Maternal Grandmother   . Hyperlipidemia Maternal Grandmother   . Heart disease Maternal Grandmother   . Atrial fibrillation Maternal Grandmother   . Diabetes Maternal Grandmother     Social History   Tobacco Use  . Smoking status: Former Research scientist (life sciences)  . Smokeless tobacco: Never Used  Substance Use Topics  . Alcohol use: Yes    Alcohol/week: 2.0 standard drinks    Types: 2 Standard drinks or equivalent per week  . Drug use: Not on file    Home Medications Prior to Admission medications   Medication Sig Start Date End Date Taking? Authorizing Provider  dexamethasone (DECADRON) 2 MG tablet Take 3 tablets once daily for 2 days followed by 2 tablets once daily for 4 days followed by 1 tablet once daily for 4 days. 12/20/19   Bonnielee Haff, MD  escitalopram (LEXAPRO) 20 MG tablet Take 1 tablet (20 mg total) by mouth in the morning. 01/12/20   Nicolette Bang, DO  gabapentin (NEURONTIN) 600 MG tablet Take 1 tablet twice daily in the morning and afternoon and then 2 tablets at nighttime before going to bed 12/20/19   Bonnielee Haff, MD  melatonin 5 MG TABS Take 5 mg by mouth at bedtime.    [provider]  naproxen (NAPROSYN) 250 MG tablet Take 1 tablet (250 mg total) by mouth 2 (two) times daily with a meal. 12/20/19   Bonnielee Haff, MD  polyethylene glycol (MIRALAX / GLYCOLAX) 17 g packet Take 17 g by mouth daily as needed for moderate constipation. 12/20/19   Bonnielee Haff, MD  senna-docusate (SENOKOT-S) 8.6-50 MG tablet Take 2 tablets by mouth at bedtime. 12/20/19   Bonnielee Haff, MD  tamsulosin (FLOMAX) 0.4 MG CAPS capsule Take 1 capsule (0.4 mg total) by mouth at bedtime. 03/10/20   Nicolette Bang, DO  tiZANidine (ZANAFLEX) 2 MG tablet Take 1 tablet (2 mg total) by mouth 3  (three) times daily as needed for muscle spasms. 12/23/19   Nicolette Bang, DO    Allergies    Strawberry (diagnostic) and Other  Review of Systems   Review of Systems Ten systems are reviewed and are negative for acute change except as noted in the HPI  Physical Exam Updated Vital Signs BP 135/88 (BP Location: Left Arm)   Pulse 94   Temp 98.5 F (36.9 C) (Oral)   Resp 14   SpO2 97%   Physical Exam Constitutional:      General: He is not in acute distress.    Appearance: Normal appearance. He is well-developed. He is not ill-appearing or diaphoretic.  HENT:     Head: Normocephalic and atraumatic.  Eyes:     General: Vision grossly intact. Gaze aligned appropriately.     Pupils: Pupils are equal, round, and reactive to light.  Neck:     Trachea: Trachea and phonation normal.  Cardiovascular:     Rate and Rhythm: Normal rate and regular rhythm.     Pulses:          Dorsalis pedis pulses are 2+ on the right side and 2+ on the left side.  Pulmonary:     Effort: Pulmonary effort is normal. No respiratory distress.     Breath sounds: Normal breath sounds.  Abdominal:     General: There is no distension.     Palpations: Abdomen is soft.     Tenderness: There is no abdominal tenderness. There is no guarding or rebound.  Musculoskeletal:        General: Normal range of motion.     Cervical back: Normal range of motion.     Right lower leg: No tenderness. No edema.     Left lower leg: No tenderness. No edema.  Skin:    General: Skin is warm and dry.  Neurological:     Mental Status: He is alert.     GCS: GCS eye subscore is 4. GCS verbal subscore is 5. GCS motor subscore is 6.     Comments: Speech is clear and goal oriented, follows commands Major Cranial nerves without deficit, no facial droop Moves extremities without ataxia, coordination intact  Psychiatric:        Behavior: Behavior normal.    ED Results / Procedures / Treatments   Labs (all labs ordered  are listed, but only abnormal results are displayed) Labs Reviewed  RESPIRATORY PANEL BY RT PCR (FLU A&B, COVID)  BASIC METABOLIC PANEL  CBC  BRAIN NATRIURETIC PEPTIDE  TROPONIN I (HIGH SENSITIVITY)  TROPONIN I (HIGH SENSITIVITY)    EKG EKG Interpretation  Date/Time:  Thursday March 30 2020 14:18:12 EDT Ventricular Rate:  107 PR Interval:  154 QRS Duration: 82 QT Interval:  312 QTC Calculation: 416 R Axis:   34 Text Interpretation: Sinus  tachycardia Nonspecific T wave abnormality Confirmed by Lajean Saver [phone]) on 03/30/2020 7:33:44 PM   Radiology DG Chest 2 View  Result Date: 03/30/2020 CLINICAL DATA:  Shortness of breath for 1 week. EXAM: CHEST - 2 VIEW COMPARISON:  None. FINDINGS: The heart size and mediastinal contours are within normal limits. Both lungs are clear. The visualized skeletal structures are unremarkable. Spinal stimulator leads are noted. IMPRESSION: No active cardiopulmonary disease. Electronically Signed   By: Abelardo Diesel M.D.   On: 03/30/2020 15:04   CT Angio Chest PE W and/or Wo Contrast  Result Date: 03/30/2020 CLINICAL DATA:  34 year old male with shortness of breath for 1 week. Negative for COVID-19 4 days ago. EXAM: CT ANGIOGRAPHY CHEST WITH CONTRAST TECHNIQUE: Multidetector CT imaging of the chest was performed using the standard protocol during bolus administration of intravenous contrast. Multiplanar CT image reconstructions and MIPs were obtained to evaluate the vascular anatomy. CONTRAST:  73m OMNIPAQUE IOHEXOL 350 MG/ML SOLN COMPARISON:  Chest radiographs earlier today. FINDINGS: Cardiovascular: Good contrast bolus timing in the pulmonary arterial tree. No focal filling defect identified in the pulmonary arteries to suggest acute pulmonary embolism. No calcified coronary artery atherosclerosis is evident. Negative visible aorta. Cardiac size within normal limits. No pericardial effusion. Mediastinum/Nodes: Increased bilateral mediastinal and  hilar lymph nodes. Nodes are increased both in size (up to 15 mm short axis) and number., and are fairly symmetric. No axillary lymphadenopathy. Grossly negative visible thoracic inlet. Lungs/Pleura: The entire costophrenic angles are not included. Major airways are patent. Aside from minor dependent atelectasis the bilateral lung parenchyma is largely negative. There is a small 5 mm left upper lobe subpleural lung nodule located posteriorly. Upper Abdomen: Negative. Musculoskeletal: Lower thoracic spinal stimulator device in place. Otherwise negative. Review of the MIP images confirms the above findings. IMPRESSION: 1. Negative for acute pulmonary embolus. 2. Generalized mediastinal and bilateral hilar lymphadenopathy. This is nonspecific but appears more likely to be inflammatory than malignant. Absence of acute pulmonary process argues against an infectious etiology. Differential considerations would include systemic inflammatory processes - such as sarcoidosis - as well as lymphoproliferative disorders. Perhaps referral [facility] would be the next best step in management. 3. Small 5 mm left upper lobe lung nodule, most likely postinflammatory and benign in this age group. Electronically Signed   By: HGenevie AnnM.D.   On: 03/30/2020 19:17    Procedures Procedures (including critical care time)  Medications Ordered in ED Medications  sodium chloride 0.9 % bolus 500 mL (0 mLs Intravenous Stopped 03/30/20 1917)  morphine 2 MG/ML injection 2 mg (2 mg Intravenous Given 03/30/20 1821)  iohexol (OMNIPAQUE) 350 MG/ML injection 75 mL (75 mLs Intravenous Contrast Given 03/30/20 1846)    ED Course  I have reviewed the triage vital signs and the nursing notes.  Pertinent labs & imaging results that were available during my care of the patient were reviewed by me and considered in my medical decision making (see chart for details).  Clinical Course as of Mar 30 1953   Thu Mar 30, 2020  1945 GLeodis RainsINbox   [BM]    Clinical Course User Index [BM] MGari Crown  MDM Rules/Calculators/A&P                         Additional history obtained from: 1. Nursing notes from this visit. 2. Electronic Medical Record. ---------- 34year old male arrives with 1 week history of shortness  of breath and chest pain.  Sent by PCP for evaluation of possible pulmonary embolism.  He is describes a pleuritic chest pain and exertional shortness of breath.  His risk factors include recent immobilization for several weeks.  He is tachycardic on arrival, not tachypneic in no acute distress.  Will obtain CT angio PE study for evaluation of possible clot due to multiple risk factors. - I reviewed and interpreted labs which include: CBC without anemia or leukocytosis. BMP without emergent electrolyte derangement, AKI or gap. BNP within normal limits. High-sensitivity troponin within normal limits x2 (5->7) COVID Pending  CXR:  IMPRESSION:  No active cardiopulmonary disease.   CT Angio Chest PE:  IMPRESSION:  1. Negative for acute pulmonary embolus.    2. Generalized mediastinal and bilateral hilar lymphadenopathy.  This is nonspecific but appears more likely to be inflammatory than  malignant. Absence of acute pulmonary process argues against an  infectious etiology.  Differential considerations would include systemic inflammatory  processes - such as sarcoidosis - as well as lymphoproliferative  disorders.  Perhaps referral to [facility]  [facility]) would be the next best step in management.    3. Small 5 mm left upper lobe lung nodule, most likely  postinflammatory and benign in this age group.   EKG: Sinus tachycardia Nonspecific T wave abnormality Confirmed by Lajean Saver [phone]) on 03/30/2020 7:33:44 PM -------------------------- Patient will require follow-up with pulmonology, referral given to [facility]  pulmonology patient informed to call their office tomorrow morning to schedule appointment.   I discussed case with oncologist Dr. Benay Spice, advised patient may also follow-up with his PCP and other specialist for referral to oncology but to send him patient information for [place] [facility] as well. - I reevaluated the patient he is resting comfortably no acute distress advised on CT concerns above.  He is agreeable for follow-up with his rheumatologist.  He also wishes to follow-up with pulmonologist and oncologist, I then forwarded patient information to Dr. Benay Spice.  I encourage patient to call to schedule follow-ups with his PCP, rheumatologist and with the pulmonology clinic tomorrow. - Suspect patient symptoms today secondary to lymphadenopathy seen within the chest.  He has stable vital signs on reevaluation and appears appropriate for outpatient follow-up.  There is no indication for admission or antibiotics at this time.  Low suspicion for ACS, dissection, PE, pneumonia or other emergent pathologies at this time.  Patient was seen and evaluated by Dr. Ashok Cordia during this visit who agrees with discharge and outpatient follow-up.  At this time there does not appear to be any evidence of an acute emergency medical condition and the patient appears stable for discharge with appropriate outpatient follow up. Diagnosis was discussed with patient who verbalizes understanding of care plan and is agreeable to discharge. I have discussed return precautions with patient who verbalizes understanding. Patient encouraged to follow-up with their PCP. All questions answered.   Note: Portions of this report may have been transcribed using voice recognition software. Every effort was made to ensure accuracy; however, inadvertent computerized transcription errors may still be present. Final Clinical Impression(s) / ED Diagnoses Final diagnoses:  Hilar lymphadenopathy  Mediastinal lymphadenopathy  Lung nodule   Atypical chest pain    Rx / DC Orders ED Discharge Orders         Ordered    Ambulatory referral to Pulmonology        03/30/20 1935           Deliah Boston,  PA-C 03/30/20 2001    Lajean Saver, MD 03/31/20 1544

## 2020-03-31 ENCOUNTER — Telehealth (INDEPENDENT_AMBULATORY_CARE_PROVIDER_SITE_OTHER): Payer: Medicare Other | Admitting: Family Medicine

## 2020-03-31 DIAGNOSIS — R0602 Shortness of breath: Secondary | ICD-10-CM | POA: Diagnosis not present

## 2020-03-31 MED ORDER — ALBUTEROL SULFATE HFA 108 (90 BASE) MCG/ACT IN AERS: 2.0000 | INHALATION_SPRAY | RESPIRATORY_TRACT | 1 refills | Status: DC | PRN

## 2020-03-31 MED ORDER — PREDNISONE 50 MG PO TABS: 50.0000 mg | ORAL_TABLET | Freq: Every day | ORAL | 0 refills | Status: AC

## 2020-03-31 MED ORDER — AEROCHAMBER MINI CHAMBER DEVI: 0 refills | Status: DC

## 2020-03-31 NOTE — Progress Notes (Signed)
Virtual Visit via Telephone Note  I connected with Lawrence Cantu on 03/31/20 at 10:50 AM EDT by telephone and verified that I am speaking with the correct person using two identifiers.  Location: Patient: Home  Provider: Office    I discussed the limitations, risks, security and privacy concerns of performing an evaluation and management service by telephone and the availability of in person appointments. I also discussed with the patient that there may be a patient responsible charge related to this service. The patient expressed understanding and agreed to proceed.   History of Present Illness: Lawrence Cantu, 34 y.o, male is present via today's telemedicine encounter for evaluation of shortness of breath. Medical history significant for chronic pain, chronic DDD, and post laminectomy. Reports presented to ER with worsening chest pain and shortness of breath . Concern for PE he presented to the ER. Patient had been immobile s/p back surgery for approximately 2 months and began advancing activity approximately 3 weeks ago. CTA performed in ER and was significant for bilateral hilar lymphadenopathy and 5 mm left upper lobe nodule thought to be benign. He is scheduled to follow-up with pulmonology for further work-up. He continues to experienced shortness of breath and is seeking management.  PE ruled out as source of symptoms.  Past Medical History:  Diagnosis Date  . Arthritis    Phreesia 12/05/2019  . BPH (benign prostatic hyperplasia)   . Chronic pain   . DDD (degenerative disc disease), cervical   . DDD (degenerative disc disease), lumbar   . Depression    Phreesia 12/05/2019  . Hyperlipidemia   . Hypertension   . Neuromuscular disorder ([facility])    Phreesia 12/05/2019  . Post laminectomy syndrome   . Spinal stenosis      Observations/Objective:   Assessment and Plan: 1. Shortness of breath -Prednisone 50 mg x 3 days -Albuterol 2 puffs every 4-6 hours PRN -reviewed CT,  lymphadenopathy though to be be consistent with inflammatory related less likely related to bacterial infection. Pulmonology and possible oncology follow-up warranted.  Follow Up Instructions: Keep follow-up with pulmonology for further work-up and recommendations.   I discussed the assessment and treatment plan with the patient. The patient was provided an opportunity to ask questions and all were answered. The patient agreed with the plan and demonstrated an understanding of the instructions.   The patient was advised to call back or seek an in-person evaluation if the symptoms worsen or if the condition fails to improve as anticipated.  I provided  30 minutes of non-face-to-face time during this encounter.      Joaquin Courts, FNP Primary Care at [facility] [address], [place] [address] [phone]: [phone]

## 2020-04-03 ENCOUNTER — Other Ambulatory Visit: Payer: Self-pay

## 2020-04-03 DIAGNOSIS — F32A Depression, unspecified: Secondary | ICD-10-CM

## 2020-04-03 MED ORDER — ESCITALOPRAM OXALATE 20 MG PO TABS: 20.0000 mg | ORAL_TABLET | Freq: Every morning | ORAL | 0 refills | Status: DC

## 2020-04-20 ENCOUNTER — Other Ambulatory Visit: Payer: Self-pay

## 2020-04-20 ENCOUNTER — Encounter: Payer: Self-pay | Admitting: Emergency Medicine

## 2020-04-20 ENCOUNTER — Ambulatory Visit (INDEPENDENT_AMBULATORY_CARE_PROVIDER_SITE_OTHER): Payer: Medicare Other | Admitting: Emergency Medicine

## 2020-04-20 VITALS — BP 114/80 | HR 105 | Temp 98.2°F | Ht 71.0 in | Wt 228.4 lb

## 2020-04-20 DIAGNOSIS — R0602 Shortness of breath: Secondary | ICD-10-CM

## 2020-04-20 DIAGNOSIS — R590 Localized enlarged lymph nodes: Secondary | ICD-10-CM

## 2020-04-20 DIAGNOSIS — R0600 Dyspnea, unspecified: Secondary | ICD-10-CM | POA: Insufficient documentation

## 2020-04-20 DIAGNOSIS — D869 Sarcoidosis, unspecified: Secondary | ICD-10-CM | POA: Insufficient documentation

## 2020-04-20 DIAGNOSIS — D860 Sarcoidosis of lung: Secondary | ICD-10-CM | POA: Insufficient documentation

## 2020-04-20 NOTE — Assessment & Plan Note (Addendum)
Severe progressive dyspnea, certainly worse over the last 2 weeks.  May coincide with the initiation of Humira and then Cosentyx.  He had a CT chest that did not show active pneumonitis but did show some generalized mediastinal lymphadenopathy.   We will arrange for pulmonary function testing soon as possible.  He has an albuterol that he received in the emergency department, okay for him to use this to see if he gets benefit.

## 2020-04-20 NOTE — Addendum Note (Signed)
Addended by: Benjie Karvonen R on: 04/20/2020 03:17 PM   Modules accepted: Orders

## 2020-04-20 NOTE — Progress Notes (Signed)
Subjective:    Patient ID: Lawrence Cantu, male    DOB: Nov 28, 1985, 34 y.o.   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    Referred for SOB and for lymphadenopathy found on CT chest done 03/30/2020. He reports that he has been experiencing some increased exertional SOB, also when sitting or supine. Over the last 2 weeks progressively worse. Increased pain meds about 4 months. Was started on Humira about 6-8 weeks ago, then to Cosyntex - only took twice and then was stopped due to the SOB.   A CT chest 03/30/2020 reviewed by me, showed no PE, generalized mediastinal and bilateral hilar lymphadenopathy up to 1.5 cm, no focal infiltrates, small 5 mm left upper lobe nodule.   Review of Systems As per HPI  Past Medical History:  Diagnosis Date  . Arthritis    Phreesia 12/05/2019  . BPH (benign prostatic hyperplasia)   . Chronic pain   . DDD (degenerative disc disease), cervical   . DDD (degenerative disc disease), lumbar   . Depression    Phreesia 12/05/2019  . Hyperlipidemia   . Hypertension   . Neuromuscular disorder ([place])    Phreesia 12/05/2019  . Post laminectomy syndrome   . Spinal stenosis      Family History  Problem Relation Age of Onset  . Cancer Mother   . Depression Mother   . Depression Father   . Thyroid cancer Father   . Depression Sister   . Depression Brother   . Hypertension Maternal Grandmother   . Hyperlipidemia Maternal Grandmother   . Heart disease Maternal Grandmother   . Atrial fibrillation Maternal Grandmother   . Diabetes Maternal Grandmother      Social History   Socioeconomic History  . Marital status: Married    Spouse name: Not on file  . Number of children: Not on file  . Years of education: Not on file  . Highest education level: Not on file  Occupational History  .  Not on file  Tobacco Use  . Smoking status: Former Smoker    Packs/day: 0.50    Types: Cigarettes    Quit date: 04/20/2014    Years since quitting: 6.0  . Smokeless tobacco: Never Used  Vaping Use  . Vaping Use: Never used  Substance and Sexual Activity  . Alcohol use: Yes    Alcohol/week: 2.0 standard drinks    Types: 2 Standard drinks or equivalent per week  . Drug use: Not on file  . Sexual activity: Not on file  Other Topics Concern  . Not on file  Social History Narrative  . Not on file   Social Determinants of Health   Financial Resource Strain:   . Difficulty of Paying Living Expenses: Not on file  Food Insecurity:   . Worried About Charity fundraiser in the Last Year: Not on file  . Ran Out of Food in the Last Year: Not on file  Transportation Needs:   . Lack of Transportation (Medical): Not on file  . Lack of Transportation (Non-Medical): Not on file  Physical Activity:   . Days of Exercise per Week: Not on file  . Minutes of Exercise per Session: Not on file  Stress:   . Feeling of Stress : Not on file  Social Connections:   . Frequency of Communication with Friends and Family:  Not on file  . Frequency of Social Gatherings with Friends and Family: Not on file  . Attends Religious Services: Not on file  . Active Member of Clubs or Organizations: Not on file  . Attends Archivist Meetings: Not on file  . Marital Status: Not on file  Intimate Partner Violence:   . Fear of Current or Ex-Partner: Not on file  . Emotionally Abused: Not on file  . Physically Abused: Not on file  . 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 Strawberry (Diagnostic) Anaphylaxis  . Other Hives    Cat Dander     Outpatient Medications Prior to Visit  Medication Sig Dispense Refill  . Adalimumab (HUMIRA) 20 MG/0.2ML PSKT Inject into the skin.     Marland Kitchen albuterol (VENTOLIN HFA) 108 (90 Base) MCG/ACT inhaler Inhale 2 puffs into the lungs every 4 (four) hours as needed for wheezing or shortness of breath (cough, shortness of breath or wheezing.). 1 each 1  . escitalopram (LEXAPRO) 20 MG tablet Take 1 tablet (20 mg total) by mouth in the morning. 90 tablet 0  . gabapentin (NEURONTIN) 600 MG tablet Take 2 tablets by mouth in the morning, at noon, and at bedtime.    . melatonin 5 MG TABS Take 5 mg by mouth at bedtime.    Marland Kitchen morphine (MS CONTIN) 15 MG 12 hr tablet Take 1 tablet by mouth 2 (two) times daily.    Marland Kitchen oxyCODONE (OXY IR/ROXICODONE) 5 MG immediate release tablet Take 5 mg by mouth every 8 (eight) hours as needed.    Marland Kitchen Spacer/Aero-Holding Chambers (AEROCHAMBER MINI CHAMBER) DEVI Administration device for albuterol inhaler 1 each 0  . tamsulosin (FLOMAX) 0.4 MG CAPS capsule Take 1 capsule (0.4 mg total) by mouth at bedtime. 90 capsule 0  . tiZANidine (ZANAFLEX) 2 MG tablet Take 1 tablet (2 mg total) by mouth 3 (three) times daily as needed for muscle spasms. 30 tablet 0   No facility-administered medications prior to visit.        Objective:   Physical Exam  Vitals:   04/20/20 1425  BP: 114/80  Pulse: (!) 105  Temp: 98.2 F (36.8 C)  TempSrc: Temporal  SpO2: 97%  Weight: 228 lb 6.4 oz (103.6 kg)  Height: 5' 11"  (1.803 m)   Gen: Pleasant, well-nourished, in no distress,  normal affect  ENT: No lesions,  mouth clear,  oropharynx clear, no postnasal drip  Neck: No JVD, no stridor  Lungs: No use of accessory muscles, no crackles or wheezing on normal respiration, no wheeze on forced expiration  Cardiovascular: RRR, heart sounds normal, no murmur or gallops, no peripheral edema  Musculoskeletal: No deformities, no cyanosis or clubbing  Neuro: alert, awake, non focal  Skin: Warm, no lesions or rash      Assessment & Plan:  Dyspnea Severe progressive dyspnea, certainly worse over the last 2 weeks.  May coincide with the  initiation of Humira and then Cosentyx.  He had a CT chest that did not show active pneumonitis but did show some generalized mediastinal lymphadenopathy.   We will arrange for pulmonary function testing soon as possible.  He has an albuterol that he received in the emergency department, okay for him to use this to see if he gets benefit.  Mediastinal lymphadenopathy Question whether this could relate to his ankylosing spondylitis, consider whether  this relates to the Humira or Cosentyx. Other potential causes include a noninfectious granulomatous inflammatory process, sarcoid.  Also consider lymphoma although I believe this is less likely. I think we will repeat his CT scan in early November once he has been off of the immunomodulatory medications for several weeks.  If the lymphadenopathy persists then we will arrange for bronchoscopy with endobronchial ultrasound and biopsies of the nodes.  Baltazar Apo, MD, PhD 04/20/2020, 3:08 PM [facility] [phone] or if no answer [phone]

## 2020-04-20 NOTE — Addendum Note (Signed)
Addended by: Demetrio Lapping E on: 04/20/2020 03:19 PM   Modules accepted: Orders

## 2020-04-20 NOTE — Assessment & Plan Note (Signed)
Question whether this could relate to his ankylosing spondylitis, consider whether this relates to the Humira or Cosentyx. Other potential causes include a noninfectious granulomatous inflammatory process, sarcoid.  Also consider lymphoma although I believe this is less likely. I think we will repeat his CT scan in early November once he has been off of the immunomodulatory medications for several weeks.  If the lymphadenopathy persists then we will arrange for bronchoscopy with endobronchial ultrasound and biopsies of the nodes.

## 2020-04-20 NOTE — Patient Instructions (Signed)
We will repeat your CT scan of the chest with contrast first week of November to follow your enlarged lymph nodes. Blood work today We will arrange for pulmonary function testing Follow with Dr. Delton Coombes in early November after your CT so that we can review the results together. Depending on results we may decide to schedule bronchoscopy to better evaluate your lymph nodes.

## 2020-04-21 LAB — ANGIOTENSIN CONVERTING ENZYME: Angiotensin-Converting Enzyme: 44 U/L (ref 9–67)

## 2020-04-27 ENCOUNTER — Ambulatory Visit (HOSPITAL_COMMUNITY): Payer: Medicare Other

## 2020-05-10 ENCOUNTER — Other Ambulatory Visit: Payer: Self-pay

## 2020-05-10 ENCOUNTER — Telehealth: Payer: Self-pay | Admitting: Emergency Medicine

## 2020-05-10 ENCOUNTER — Ambulatory Visit (HOSPITAL_COMMUNITY)
Admission: RE | Admit: 2020-05-10 | Discharge: 2020-05-10 | Disposition: A | Discharge status: Home / Self Care | Payer: Medicare Other | Source: Ambulatory Visit | Attending: Emergency Medicine | Admitting: Emergency Medicine

## 2020-05-10 DIAGNOSIS — R0602 Shortness of breath: Secondary | ICD-10-CM | POA: Diagnosis present

## 2020-05-10 DIAGNOSIS — R590 Localized enlarged lymph nodes: Secondary | ICD-10-CM | POA: Insufficient documentation

## 2020-05-10 NOTE — Telephone Encounter (Signed)
Called and spoke to patient, who is requesting results of 05/10/2020 CT.  He is also questioning when he can restart Humira, as he was instructed to hold Humira until after CT. Patient has pending OV with Dr. Delton Coombes for 05/29/2020  Dr. Loanne Drilling advise. Thanks

## 2020-05-11 NOTE — Telephone Encounter (Signed)
Called and spoke with patient about recs from Dr. Delton Coombes and he expressed understanding. Patient has an appt for PFT on 05/16/20 and then OV on 05/29/20. Advised patient to call if he needed anything before then. Nothing further needed at this time.

## 2020-05-11 NOTE — Telephone Encounter (Signed)
Pt returning call.  [phone].

## 2020-05-11 NOTE — Telephone Encounter (Signed)
LMTCB for pt 

## 2020-05-11 NOTE — Telephone Encounter (Signed)
Please let him know that CT chest shows stable to slightly improved lymph node enlargement, otherwise unchanged compared with his original scan that we reviewed.  Recommend: -He can go ahead and get back on the Humira as it is very unlikely to have been responsible for his enlarged lymph nodes -We will need follow-up in office to discuss whether he might benefit from any other testing to evaluate the nodes since they are still enlarged, in particular bronchoscopy.

## 2020-05-16 ENCOUNTER — Ambulatory Visit (INDEPENDENT_AMBULATORY_CARE_PROVIDER_SITE_OTHER): Payer: Medicare Other | Admitting: Emergency Medicine

## 2020-05-16 ENCOUNTER — Other Ambulatory Visit: Payer: Self-pay

## 2020-05-16 DIAGNOSIS — R0602 Shortness of breath: Secondary | ICD-10-CM

## 2020-05-16 DIAGNOSIS — R590 Localized enlarged lymph nodes: Secondary | ICD-10-CM

## 2020-05-16 NOTE — Progress Notes (Signed)
PFT done today. 

## 2020-05-26 LAB — PULMONARY FUNCTION TEST
DL/VA % pred: 104 %
DL/VA: 4.99 ml/min/mmHg/L
DLCO cor % pred: 88 %
DLCO cor: 29.15 ml/min/mmHg
DLCO unc % pred: 88 %
DLCO unc: 29.15 ml/min/mmHg
FEF 25-75 Post: 4.42 L/sec
FEF 25-75 Pre: 4.28 L/sec
FEF2575-%Change-Post: 3 %
FEF2575-%Pred-Post: 101 %
FEF2575-%Pred-Pre: 97 %
FEV1-%Change-Post: 0 %
FEV1-%Pred-Post: 83 %
FEV1-%Pred-Pre: 83 %
FEV1-Post: 3.77 L
FEV1-Pre: 3.76 L
FEV1FVC-%Change-Post: 0 %
FEV1FVC-%Pred-Pre: 104 %
FEV6-%Change-Post: 0 %
FEV6-%Pred-Post: 81 %
FEV6-%Pred-Pre: 80 %
FEV6-Post: 4.45 L
FEV6-Pre: 4.43 L
FEV6FVC-%Change-Post: 0 %
FEV6FVC-%Pred-Post: 101 %
FEV6FVC-%Pred-Pre: 101 %
FVC-%Change-Post: 0 %
FVC-%Pred-Post: 79 %
FVC-%Pred-Pre: 79 %
FVC-Post: 4.45 L
FVC-Pre: 4.44 L
Post FEV1/FVC ratio: 85 %
Post FEV6/FVC ratio: 100 %
Pre FEV1/FVC ratio: 85 %
Pre FEV6/FVC Ratio: 100 %
RV % pred: 85 %
RV: 1.51 L
TLC % pred: 84 %
TLC: 6.02 L

## 2020-05-29 ENCOUNTER — Ambulatory Visit (INDEPENDENT_AMBULATORY_CARE_PROVIDER_SITE_OTHER): Payer: Medicare Other | Admitting: Emergency Medicine

## 2020-05-29 ENCOUNTER — Telehealth: Payer: Self-pay | Admitting: Emergency Medicine

## 2020-05-29 ENCOUNTER — Other Ambulatory Visit: Payer: Self-pay

## 2020-05-29 ENCOUNTER — Encounter: Payer: Self-pay | Admitting: Emergency Medicine

## 2020-05-29 VITALS — BP 132/82 | HR 106 | Temp 97.9°F | Ht 71.0 in | Wt 235.0 lb

## 2020-05-29 DIAGNOSIS — R791 Abnormal coagulation profile: Secondary | ICD-10-CM | POA: Diagnosis not present

## 2020-05-29 DIAGNOSIS — R590 Localized enlarged lymph nodes: Secondary | ICD-10-CM

## 2020-05-29 LAB — COMPREHENSIVE METABOLIC PANEL
ALT: 31 U/L (ref 0–53)
AST: 23 U/L (ref 0–37)
Albumin: 4.2 g/dL (ref 3.5–5.2)
Alkaline Phosphatase: 56 U/L (ref 39–117)
BUN: 15 mg/dL (ref 6–23)
CO2: 30 mEq/L (ref 19–32)
Calcium: 9.4 mg/dL (ref 8.4–10.5)
Chloride: 104 mEq/L (ref 96–112)
Creatinine, Ser: 0.86 mg/dL (ref 0.40–1.50)
GFR: 112.57 mL/min (ref >60.00)
Glucose, Bld: 93 mg/dL (ref 70–99)
Potassium: 4.5 mEq/L (ref 3.5–5.1)
Sodium: 139 mEq/L (ref 135–145)
Total Bilirubin: 0.5 mg/dL (ref 0.2–1.2)
Total Protein: 6.9 g/dL (ref 6.0–8.3)

## 2020-05-29 LAB — CBC WITH DIFFERENTIAL/PLATELET
Basophils Absolute: 0 10*3/uL (ref 0.0–0.1)
Basophils Relative: 0.8 % (ref 0.0–3.0)
Eosinophils Absolute: 0.3 10*3/uL (ref 0.0–0.7)
Eosinophils Relative: 5.5 % — ABNORMAL HIGH (ref 0.0–5.0)
HCT: 43.7 % (ref 39.0–52.0)
Hemoglobin: 14.9 g/dL (ref 13.0–17.0)
Lymphocytes Relative: 15.8 % (ref 12.0–46.0)
Lymphs Abs: 0.9 10*3/uL (ref 0.7–4.0)
MCHC: 34.1 g/dL (ref 30.0–36.0)
MCV: 81 fl (ref 78.0–100.0)
Monocytes Absolute: 0.5 10*3/uL (ref 0.1–1.0)
Monocytes Relative: 8.9 % (ref 3.0–12.0)
Neutro Abs: 4 10*3/uL (ref 1.4–7.7)
Neutrophils Relative %: 69 % (ref 43.0–77.0)
Platelets: 263 10*3/uL (ref 150.0–400.0)
RBC: 5.39 Mil/uL (ref 4.22–5.81)
RDW: 13.3 % (ref 11.5–15.5)
WBC: 5.8 10*3/uL (ref 4.0–10.5)

## 2020-05-29 LAB — PROTIME-INR
INR: 1.2 ratio — ABNORMAL HIGH (ref 0.8–1.0)
Prothrombin Time: 13 s (ref 9.6–13.1)

## 2020-05-29 NOTE — Progress Notes (Signed)
   Subjective:    Patient ID: Lawrence Cantu, male    DOB: [birth year], 34 y.o.   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    Referred for SOB and for lymphadenopathy found on CT chest done 03/30/2020. He reports that he has been experiencing some increased exertional SOB, also when sitting or supine. Over the last 2 weeks progressively worse. Increased pain meds about 4 months. Was started on Humira about 6-8 weeks ago, then to Cosyntex - only took twice and then was stopped due to the SOB.   A CT chest 03/30/2020 reviewed by me, showed no PE, generalized mediastinal and bilateral hilar lymphadenopathy up to 1.5 cm, no focal infiltrates, small 5 mm left upper lobe nodule.  [name] 05/29/20 --this follow-up visit 34 year old patient with a minimal tobacco history, hypertension, ankylosing spondylitis, DJD, cauda equina syndrome.  He was really referred for dyspnea and mediastinal/hilar lymphadenopathy.  He had been started on Humira about 2 months prior and there was some initial question that this may have been responsible.  I repeated his CT scan 05/10/2020 and this showed stable to improved adenopathy without any infiltrates, some possible mild left basilar pleural-based nodular disease. Based on that I told them to restart his biologics - he is now on Cosyntex  He underwent pulmonary function testing 05/16/2020 which I reviewed, shows possible evidence for restrictive disease on spirometry but the lung volumes are normal.  Normal diffusion capacity.   Review of Systems As per HPI     Objective:   Physical Exam  Vitals:   05/29/20 1200  BP: 132/82  Pulse: (!) 106  Temp: 97.9 F (36.6 C)  SpO2: 96%  Weight: 235 lb (106.6 kg)  Height: _0  (1.803 m)   Gen: Pleasant, well-nourished, in no distress,  normal affect  ENT:  No lesions,  mouth clear,  oropharynx clear, no postnasal drip  Neck: No JVD, no stridor  Lungs: No use of accessory muscles, no crackles or wheezing on normal respiration, no wheeze on forced expiration  Cardiovascular: RRR, heart sounds normal, no murmur or gallops, no peripheral edema  Musculoskeletal: No deformities, no cyanosis or clubbing  Neuro: alert, awake, non focal  Skin: Warm, no lesions or rash      Assessment & Plan:  Mediastinal lymphadenopathy Stable hilar and mediastinal lymphadenopathy on his repeat CT from this month.  Question whether this could be related to ankylosing spondylitis, doubt that it had anything to do with his biologic therapy.  He is now back on Cosentyx.  My suspicion for lymphoma is low but I do believe he needs a tissue diagnosis.  Question sarcoid.  His ACE level was negative but the sarcoid could be quiescent.  We discussed the pros and cons of endobronchial ultrasound and he agrees.  I will try to get this set up for 12/7   Dyspnea No clear evidence for obstruction based on his spirometry and his flow volume loop.  There may have been some restriction.  Question very mild mixed disease based on his clinical response to albuterol.  I do not think there is an indication to start scheduled bronchodilator therapy but he can keep the albuterol and use it as needed.  Baltazar Apo, MD, PhD 05/29/2020, 12:44 PM [facility] [phone] or if no answer [phone]

## 2020-05-29 NOTE — Patient Instructions (Signed)
Your pulmonary function testing is reassuring.  There may be some evidence for some mild restriction on the size of each breath you take. I do not believe we need to start an every day inhaler right now.  Please continue to keep your albuterol available to use 2 puffs if needed for shortness of breath, chest tightness, wheezing. We reviewed your CT scan of the chest today. We will arrange for bronchoscopy to better evaluate your enlarged lymph nodes. Lab work today Follow with Dr Delton Coombes in 1 month

## 2020-05-29 NOTE — Telephone Encounter (Signed)
I have scheduled pt for Bronch/EBUS on 12/7 at 9:30 at [facility].  Covid test 12/4 at 9:35.  Pt had labs drawn today.  Gave appt info to pt.

## 2020-05-29 NOTE — Assessment & Plan Note (Signed)
No clear evidence for obstruction based on his spirometry and his flow volume loop.  There may have been some restriction.  Question very mild mixed disease based on his clinical response to albuterol.  I do not think there is an indication to start scheduled bronchodilator therapy but he can keep the albuterol and use it as needed.

## 2020-05-29 NOTE — Addendum Note (Signed)
Addended by: Demetrio Lapping E on: 05/29/2020 01:02 PM   Modules accepted: Orders

## 2020-05-29 NOTE — H&P (View-Only) (Signed)
   Subjective:    Patient ID: Lawrence Cantu, male    DOB: 06/20/1986, 34 y.o.   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    Referred for SOB and for lymphadenopathy found on CT chest done 03/30/2020. He reports that he has been experiencing some increased exertional SOB, also when sitting or supine. Over the last 2 weeks progressively worse. Increased pain meds about 4 months. Was started on Humira about 6-8 weeks ago, then to Cosyntex - only took twice and then was stopped due to the SOB.   A CT chest 03/30/2020 reviewed by me, showed no PE, generalized mediastinal and bilateral hilar lymphadenopathy up to 1.5 cm, no focal infiltrates, small 5 mm left upper lobe nodule.  [name] 05/29/20 --this follow-up visit 34-year-old patient with a minimal tobacco history, hypertension, ankylosing spondylitis, DJD, cauda equina syndrome.  He was really referred for dyspnea and mediastinal/hilar lymphadenopathy.  He had been started on Humira about 2 months prior and there was some initial question that this may have been responsible.  I repeated his CT scan 05/10/2020 and this showed stable to improved adenopathy without any infiltrates, some possible mild left basilar pleural-based nodular disease. Based on that I told them to restart his biologics - he is now on Cosyntex  He underwent pulmonary function testing 05/16/2020 which I reviewed, shows possible evidence for restrictive disease on spirometry but the lung volumes are normal.  Normal diffusion capacity.   Review of Systems As per HPI     Objective:   Physical Exam  Vitals:   05/29/20 1200  BP: 132/82  Pulse: (!) 106  Temp: 97.9 F (36.6 C)  SpO2: 96%  Weight: 235 lb (106.6 kg)  Height: 5' 11" (1.803 m)   Gen: Pleasant, well-nourished, in no distress,  normal affect  ENT:  No lesions,  mouth clear,  oropharynx clear, no postnasal drip  Neck: No JVD, no stridor  Lungs: No use of accessory muscles, no crackles or wheezing on normal respiration, no wheeze on forced expiration  Cardiovascular: RRR, heart sounds normal, no murmur or gallops, no peripheral edema  Musculoskeletal: No deformities, no cyanosis or clubbing  Neuro: alert, awake, non focal  Skin: Warm, no lesions or rash      Assessment & Plan:  Mediastinal lymphadenopathy Stable hilar and mediastinal lymphadenopathy on his repeat CT from this month.  Question whether this could be related to ankylosing spondylitis, doubt that it had anything to do with his biologic therapy.  He is now back on Cosentyx.  My suspicion for lymphoma is low but I do believe he needs a tissue diagnosis.  Question sarcoid.  His ACE level was negative but the sarcoid could be quiescent.  We discussed the pros and cons of endobronchial ultrasound and he agrees.  I will try to get this set up for 12/7   Dyspnea No clear evidence for obstruction based on his spirometry and his flow volume loop.  There may have been some restriction.  Question very mild mixed disease based on his clinical response to albuterol.  I do not think there is an indication to start scheduled bronchodilator therapy but he can keep the albuterol and use it as needed.  Valeri Sula, MD, PhD 05/29/2020, 12:44 PM [facility] [phone] or if no answer [phone]  

## 2020-05-29 NOTE — Assessment & Plan Note (Signed)
Stable hilar and mediastinal lymphadenopathy on his repeat CT from this month.  Question whether this could be related to ankylosing spondylitis, doubt that it had anything to do with his biologic therapy.  He is now back on Cosentyx.  My suspicion for lymphoma is low but I do believe he needs a tissue diagnosis.  Question sarcoid.  His ACE level was negative but the sarcoid could be quiescent.  We discussed the pros and cons of endobronchial ultrasound and he agrees.  I will try to get this set up for 12/7

## 2020-06-04 ENCOUNTER — Other Ambulatory Visit: Payer: Self-pay | Admitting: Internal Medicine

## 2020-06-04 DIAGNOSIS — S343XXS Injury of cauda equina, sequela: Secondary | ICD-10-CM

## 2020-06-10 ENCOUNTER — Other Ambulatory Visit (HOSPITAL_COMMUNITY): Payer: Medicare Other

## 2020-06-10 ENCOUNTER — Other Ambulatory Visit (HOSPITAL_COMMUNITY)
Admission: RE | Admit: 2020-06-10 | Discharge: 2020-06-10 | Disposition: A | Discharge status: Home / Self Care | Payer: Medicare Other | Source: Ambulatory Visit | Attending: Emergency Medicine | Admitting: Emergency Medicine

## 2020-06-10 DIAGNOSIS — Z20822 Contact with and (suspected) exposure to covid-19: Secondary | ICD-10-CM | POA: Diagnosis not present

## 2020-06-10 DIAGNOSIS — Z01812 Encounter for preprocedural laboratory examination: Secondary | ICD-10-CM | POA: Insufficient documentation

## 2020-06-11 LAB — SARS CORONAVIRUS 2 (TAT 6-24 HRS): SARS Coronavirus 2: NEGATIVE

## 2020-06-12 ENCOUNTER — Encounter (HOSPITAL_COMMUNITY): Payer: Self-pay | Admitting: Emergency Medicine

## 2020-06-12 NOTE — Progress Notes (Signed)
Spoke with pt for pre-op call. Pt denies cardiac history, HTN or diabetes.   Covid test done 06/10/20 and it's negative.  Pt states he's been in quarantine since the test was done and understands that he stays in quarantine until he comes to the hospital tomorrow.

## 2020-06-13 ENCOUNTER — Encounter (HOSPITAL_COMMUNITY)
Admission: RE | Disposition: A | Discharge status: Home / Self Care | Payer: Self-pay | Source: Home / Self Care | Attending: Emergency Medicine

## 2020-06-13 ENCOUNTER — Ambulatory Visit (HOSPITAL_COMMUNITY)
Admission: RE | Admit: 2020-06-13 | Discharge: 2020-06-13 | Disposition: A | Discharge status: Home / Self Care | Payer: Medicare Other | Attending: Emergency Medicine | Admitting: Emergency Medicine

## 2020-06-13 ENCOUNTER — Encounter (HOSPITAL_COMMUNITY): Payer: Self-pay | Admitting: Emergency Medicine

## 2020-06-13 ENCOUNTER — Ambulatory Visit (HOSPITAL_COMMUNITY): Payer: Medicare Other | Admitting: Certified Registered"

## 2020-06-13 ENCOUNTER — Other Ambulatory Visit: Payer: Self-pay

## 2020-06-13 DIAGNOSIS — G834 Cauda equina syndrome: Secondary | ICD-10-CM | POA: Insufficient documentation

## 2020-06-13 DIAGNOSIS — M459 Ankylosing spondylitis of unspecified sites in spine: Secondary | ICD-10-CM | POA: Diagnosis not present

## 2020-06-13 DIAGNOSIS — Z87891 Personal history of nicotine dependence: Secondary | ICD-10-CM | POA: Insufficient documentation

## 2020-06-13 DIAGNOSIS — R590 Localized enlarged lymph nodes: Secondary | ICD-10-CM

## 2020-06-13 DIAGNOSIS — Z79899 Other long term (current) drug therapy: Secondary | ICD-10-CM | POA: Diagnosis not present

## 2020-06-13 DIAGNOSIS — D860 Sarcoidosis of lung: Secondary | ICD-10-CM | POA: Diagnosis present

## 2020-06-13 DIAGNOSIS — J8410 Pulmonary fibrosis, unspecified: Secondary | ICD-10-CM | POA: Diagnosis not present

## 2020-06-13 DIAGNOSIS — I10 Essential (primary) hypertension: Secondary | ICD-10-CM | POA: Insufficient documentation

## 2020-06-13 DIAGNOSIS — D869 Sarcoidosis, unspecified: Secondary | ICD-10-CM | POA: Diagnosis present

## 2020-06-13 HISTORY — DX: Benign prostatic hyperplasia without lower urinary tract symptoms: N40.0

## 2020-06-13 HISTORY — PX: FINE NEEDLE ASPIRATION: SHX5430

## 2020-06-13 HISTORY — DX: Pneumonia, unspecified organism: J18.9

## 2020-06-13 HISTORY — DX: Anxiety disorder, unspecified: F41.9

## 2020-06-13 HISTORY — PX: BRONCHIAL WASHINGS: SHX5105

## 2020-06-13 HISTORY — DX: Ankylosing spondylitis of unspecified sites in spine: M45.9

## 2020-06-13 HISTORY — PX: VIDEO BRONCHOSCOPY WITH ENDOBRONCHIAL ULTRASOUND: SHX6177

## 2020-06-13 SURGERY — BRONCHOSCOPY, WITH EBUS
Anesthesia: General | Laterality: Bilateral

## 2020-06-13 MED ORDER — ROCURONIUM BROMIDE 10 MG/ML (PF) SYRINGE
PREFILLED_SYRINGE | INTRAVENOUS | Status: DC | PRN
  Administered 2020-06-13: 70 mg via INTRAVENOUS
  Administered 2020-06-13: 30 mg via INTRAVENOUS

## 2020-06-13 MED ORDER — MIDAZOLAM HCL 2 MG/2ML IJ SOLN
INTRAMUSCULAR | Status: DC | PRN
  Administered 2020-06-13: 2 mg via INTRAVENOUS

## 2020-06-13 MED ORDER — ONDANSETRON HCL 4 MG/2ML IJ SOLN
INTRAMUSCULAR | Status: DC | PRN
  Administered 2020-06-13: 4 mg via INTRAVENOUS

## 2020-06-13 MED ORDER — SUGAMMADEX SODIUM 200 MG/2ML IV SOLN
INTRAVENOUS | Status: DC | PRN
  Administered 2020-06-13: 200 mg via INTRAVENOUS

## 2020-06-13 MED ORDER — PHENYLEPHRINE HCL-NACL 10-0.9 MG/250ML-% IV SOLN
INTRAVENOUS | Status: DC | PRN
  Administered 2020-06-13: 50 ug/min via INTRAVENOUS

## 2020-06-13 MED ORDER — FENTANYL CITRATE (PF) 100 MCG/2ML IJ SOLN
INTRAMUSCULAR | Status: DC | PRN
Start: 2020-06-13 — End: 2020-06-13
  Administered 2020-06-13: 100 ug via INTRAVENOUS

## 2020-06-13 MED ORDER — PROPOFOL 10 MG/ML IV BOLUS
INTRAVENOUS | Status: DC | PRN
  Administered 2020-06-13: 200 mg via INTRAVENOUS

## 2020-06-13 MED ORDER — ONDANSETRON HCL 4 MG/2ML IJ SOLN: 4.0000 mg | Freq: Once | INTRAMUSCULAR | Status: DC | PRN

## 2020-06-13 MED ORDER — EPHEDRINE SULFATE-NACL 50-0.9 MG/10ML-% IV SOSY
PREFILLED_SYRINGE | INTRAVENOUS | Status: DC | PRN
  Administered 2020-06-13 (×2): 15 mg via INTRAVENOUS

## 2020-06-13 MED ORDER — FENTANYL CITRATE (PF) 100 MCG/2ML IJ SOLN: INTRAMUSCULAR | Status: DC | PRN

## 2020-06-13 MED ORDER — HYDROMORPHONE HCL 1 MG/ML IJ SOLN: 0.2500 mg | INTRAMUSCULAR | Status: DC | PRN

## 2020-06-13 MED ORDER — LACTATED RINGERS IV SOLN: INTRAVENOUS | Status: DC

## 2020-06-13 MED ORDER — ACETAMINOPHEN 10 MG/ML IV SOLN: 1000.0000 mg | Freq: Once | INTRAVENOUS | Status: DC | PRN

## 2020-06-13 MED ORDER — DEXAMETHASONE SODIUM PHOSPHATE 10 MG/ML IJ SOLN
INTRAMUSCULAR | Status: DC | PRN
  Administered 2020-06-13: 5 mg via INTRAVENOUS

## 2020-06-13 MED ORDER — LIDOCAINE 2% (20 MG/ML) 5 ML SYRINGE
INTRAMUSCULAR | Status: DC | PRN
  Administered 2020-06-13: 100 mg via INTRAVENOUS

## 2020-06-13 MED ORDER — PHENYLEPHRINE 40 MCG/ML (10ML) SYRINGE FOR IV PUSH (FOR BLOOD PRESSURE SUPPORT)
PREFILLED_SYRINGE | INTRAVENOUS | Status: DC | PRN
  Administered 2020-06-13 (×2): 120 ug via INTRAVENOUS
  Administered 2020-06-13: 160 ug via INTRAVENOUS

## 2020-06-13 NOTE — Interval H&P Note (Signed)
History and Physical Interval Note:  06/13/2020 7:29 AM  Lawrence Cantu  has presented today for surgery, with the diagnosis of MEDIASTINAL LYMPHADENOPATHY.  The various methods of treatment have been discussed with the patient and family. After consideration of risks, benefits and other options for treatment, the patient has consented to  Procedure(s): VIDEO BRONCHOSCOPY WITH ENDOBRONCHIAL ULTRASOUND (Bilateral) as a surgical intervention.  The patient's history has been reviewed, patient examined, no change in status, stable for surgery.  I have reviewed the patient's chart and labs.  Questions were answered to the patient's satisfaction.     Leslye Peer

## 2020-06-13 NOTE — Transfer of Care (Signed)
Immediate Anesthesia Transfer of Care Note  Patient: Lawrence Cantu  Procedure(s) Performed: VIDEO BRONCHOSCOPY WITH ENDOBRONCHIAL ULTRASOUND (Bilateral ) BRONCHIAL WASHINGS FINE NEEDLE ASPIRATION (FNA) LINEAR  Patient Location: PACU  Anesthesia Type:General  Level of Consciousness: awake and oriented  Airway & Oxygen Therapy: Patient Spontanous Breathing  Post-op Assessment: Report given to RN  Post vital signs: Reviewed and stable  Last Vitals:  Vitals Value Taken Time  BP 116/72 06/13/20 1109  Temp    Pulse 94 06/13/20 1110  Resp 16 06/13/20 1110  SpO2 93 % 06/13/20 1110  Vitals shown include unvalidated device data.  Last Pain:  Vitals:   06/13/20 0729  TempSrc:   PainSc: 7       Patients Stated Pain Goal: 3 (06/13/20 0729)  Complications: No complications documented.

## 2020-06-13 NOTE — Op Note (Signed)
Video Bronchoscopy with Endobronchial Ultrasound Procedure Note  Date of Operation: 06/13/2020  Pre-op Diagnosis: Mediastinal and hilar adenopathy  Post-op Diagnosis: Same  Surgeon: Baltazar Apo  Assistants: None  Anesthesia: General endotracheal anesthesia  Operation: Flexible video fiberoptic bronchoscopy with endobronchial ultrasound and biopsies.  Estimated Blood Loss: Minimal  Complications: None apparent  Indications and History: Dany Walther is a 34 y.o. male with history of ankylosing spondylitis.  He was found to have enlargement of his bilateral mediastinal and right hilar lymph nodes by CT scan of the chest.  These remained enlarged on serial films, most recent CT was 05/10/2020.  Recommendation was made to achieve a tissue diagnosis via bronchoscopy with endobronchial ultrasound and nodal biopsies.  The risks, benefits, complications, treatment options and expected outcomes were discussed with the patient.  The possibilities of pneumothorax, pneumonia, reaction to medication, pulmonary aspiration, perforation of a viscus, bleeding, failure to diagnose a condition and creating a complication requiring transfusion or operation were discussed with the patient who freely signed the consent.    Description of Procedure: The patient was examined in the preoperative area and history and data from the preprocedure consultation were reviewed. It was deemed appropriate to proceed.  The patient was taken to [facility] endoscopy room 2, identified as Clinton Gallant and the procedure verified as Flexible Video Fiberoptic Bronchoscopy.  A Time Out was held and the above information confirmed. After being taken to the operating room general anesthesia was initiated and the patient  was orally intubated. The video fiberoptic bronchoscope was introduced via the endotracheal tube and a general inspection was performed which showed normal airways throughout.  There were no endobronchial lesions or  abnormal secretions seen.  A bronchoalveolar lavage was performed in the anterior segment of the right upper lobe and sent for microbiology.  The standard scope was then withdrawn and the endobronchial ultrasound was used to identify and characterize the peritracheal, hilar and bronchial lymph nodes. Inspection showed enlargement at station 4L, station 7, station 4R.  There may been some mild enlargement at station 10 R as well. Using real-time ultrasound guidance Wang needle biopsies were take from Station 4R and 7 nodes and were sent for cytology.  The station 4L node was not reachable.  The patient tolerated the procedure well without apparent complications. There was no significant blood loss. The bronchoscope was withdrawn. Anesthesia was reversed and the patient was taken to the PACU for recovery.   Samples: 1. Wang needle biopsies from 4R node 2. Wang needle biopsies from 7 node 3. Bronchoalveolar lavage from the anterior segment of the right upper lobe for culture  Plans:  The patient will be discharged from the PACU to home when recovered from anesthesia. We will review the cytology, pathology and microbiology results with the patient when they become available. Outpatient followup will be with Dr. Lamonte Sakai.    Baltazar Apo, MD, PhD 06/13/2020, 11:07 AM [facility] [phone] or if no answer [phone]

## 2020-06-13 NOTE — Discharge Instructions (Signed)
Flexible Bronchoscopy, Care After This sheet gives you information about how to care for yourself after your test. Your doctor may also give you more specific instructions. If you have problems or questions, contact your doctor. Follow these instructions at home: Eating and drinking  Do not eat or drink anything (not even water) for 2 hours after your test, or until your numbing medicine (local anesthetic) wears off.  When your numbness is gone and your cough and gag reflexes have come back, you may: ? Eat only soft foods. ? Slowly drink liquids.  The day after the test, go back to your normal diet. Driving  Do not drive for 24 hours if you were given a medicine to help you relax (sedative).  Do not drive or use heavy machinery while taking prescription pain medicine. General instructions   Take over-the-counter and prescription medicines only as told by your doctor.  Return to your normal activities as told. Ask what activities are safe for you.  Do not use any products that have nicotine or tobacco in them. This includes cigarettes and e-cigarettes. If you need help quitting, ask your doctor.  Keep all follow-up visits as told by your doctor. This is important. It is very important if you had a tissue sample (biopsy) taken. Get help right away if:  You have shortness of breath that gets worse.  You get light-headed.  You feel like you are going to pass out (faint).  You have chest pain.  You cough up: ? More than a little blood. ? More blood than before. Summary  Do not eat or drink anything (not even water) for 2 hours after your test, or until your numbing medicine wears off.  Do not use cigarettes. Do not use e-cigarettes.  Get help right away if you have chest pain.  Please call our office for any questions or concerns.  [phone].  This information is not intended to replace advice given to you by your health care provider. Make sure you discuss any  questions you have with your health care provider. Document Revised: 06/06/2017 Document Reviewed: 07/12/2016 Elsevier Patient Education  2020 [employer].

## 2020-06-13 NOTE — Anesthesia Procedure Notes (Signed)
Procedure Name: Intubation Date/Time: 06/13/2020 10:00 AM Performed by: De Nurse, CRNA Pre-anesthesia Checklist: Patient identified, Emergency Drugs available, Suction available and Patient being monitored Patient Re-evaluated:Patient Re-evaluated prior to induction Oxygen Delivery Method: Circle System Utilized Preoxygenation: Pre-oxygenation with 100% oxygen Induction Type: IV induction Ventilation: Mask ventilation without difficulty Laryngoscope Size: McGraph and 4 Grade View: Grade I Tube type: Oral Tube size: 8.5 mm Number of attempts: 1 Airway Equipment and Method: Stylet and Oral airway Placement Confirmation: ETT inserted through vocal cords under direct vision,  positive ETCO2 and breath sounds checked- equal and bilateral Secured at: 22 cm Tube secured with: Tape Dental Injury: Teeth and Oropharynx as per pre-operative assessment  Difficulty Due To: Difficulty was anticipated and Difficult Airway- due to reduced neck mobility

## 2020-06-13 NOTE — Anesthesia Preprocedure Evaluation (Signed)
Anesthesia Evaluation  Patient identified by MRN, date of birth, ID band Patient awake    Reviewed: Patient's Chart, lab work & pertinent test results  Airway Mallampati: II  TM Distance: >3 FB Neck ROM: Full    Dental  (+) Teeth Intact   Pulmonary shortness of breath, with exertion and at rest, Patient abstained from smoking., former smoker,  MEDIASTINAL LYMPHADENOPATHY    + decreased breath sounds      Cardiovascular  Rhythm:Regular Rate:Normal     Neuro/Psych Anxiety Depression    GI/Hepatic negative GI ROS, Neg liver ROS,   Endo/Other  negative endocrine ROS  Renal/GU negative Renal ROS   BPH    Musculoskeletal  (+) Arthritis , DDD with spinal stenosis d/p fusion   Abdominal (+)  Abdomen: soft. Bowel sounds: normal.  Peds  Hematology   Anesthesia Other Findings   Reproductive/Obstetrics                             Anesthesia Physical Anesthesia Plan  ASA: III  Anesthesia Plan: General   Post-op Pain Management:    Induction: Intravenous  PONV Risk Score and Plan: 2 and Ondansetron, Treatment may vary due to age or medical condition, Midazolam and Dexamethasone  Airway Management Planned: Mask and Oral ETT  Additional Equipment: None  Intra-op Plan:   Post-operative Plan: Extubation in OR  Informed Consent: I have reviewed the patients History and Physical, chart, labs and discussed the procedure including the risks, benefits and alternatives for the proposed anesthesia with the patient or authorized representative who has indicated his/her understanding and acceptance.     Dental advisory given  Plan Discussed with: CRNA  Anesthesia Plan Comments: (Lab Results      Component                Value               Date                      WBC                      5.8                 05/29/2020                HGB                      14.9                05/29/2020                 HCT                      43.7                05/29/2020                MCV                      81.0                05/29/2020                PLT                      263.0  05/29/2020           Lab Results      Component                Value               Date                      NA                       139                 05/29/2020                K                        4.5                 05/29/2020                CO2                      30                  05/29/2020                GLUCOSE                  93                  05/29/2020                BUN                      15                  05/29/2020                CREATININE               0.86                05/29/2020                CALCIUM                  9.4                 05/29/2020                GFRNONAA                 >60                 03/30/2020                GFRAA                    >60                 03/30/2020          )        Anesthesia Quick Evaluation

## 2020-06-13 NOTE — Anesthesia Postprocedure Evaluation (Signed)
Anesthesia Post Note  Patient: Tresean Mattix  Procedure(s) Performed: VIDEO BRONCHOSCOPY WITH ENDOBRONCHIAL ULTRASOUND (Bilateral ) BRONCHIAL WASHINGS FINE NEEDLE ASPIRATION (FNA) LINEAR     Patient location during evaluation: Endoscopy Anesthesia Type: General Level of consciousness: awake and alert Pain management: pain level controlled Vital Signs Assessment: post-procedure vital signs reviewed and stable Respiratory status: spontaneous breathing, nonlabored ventilation, respiratory function stable and patient connected to nasal cannula oxygen Cardiovascular status: blood pressure returned to baseline and stable Postop Assessment: no apparent nausea or vomiting Anesthetic complications: no   No complications documented.  Last Vitals:  Vitals:   06/13/20 1125 06/13/20 1140  BP: (!) 103/58 108/68  Pulse: (!) 104 (!) 101  Resp: 17 16  Temp:  36.9 C  SpO2: 97% 92%    Last Pain:  Vitals:   06/13/20 1140  TempSrc:   PainSc: Asleep   Pain Goal: Patients Stated Pain Goal: 3 (06/13/20 0729)                 Earl Lites P Jaymi Tinner

## 2020-06-14 ENCOUNTER — Other Ambulatory Visit: Payer: Self-pay

## 2020-06-14 ENCOUNTER — Encounter (HOSPITAL_COMMUNITY): Payer: Self-pay | Admitting: Emergency Medicine

## 2020-06-14 LAB — ACID FAST SMEAR (AFB, MYCOBACTERIA): Acid Fast Smear: NEGATIVE

## 2020-06-14 LAB — FUNGUS STAIN

## 2020-06-15 ENCOUNTER — Telehealth: Payer: Self-pay | Admitting: Internal Medicine

## 2020-06-15 LAB — CULTURE, BAL-QUANTITATIVE W GRAM STAIN
Culture: NO GROWTH
Gram Stain: NONE SEEN

## 2020-06-15 LAB — CYTOLOGY - NON PAP

## 2020-06-15 NOTE — Telephone Encounter (Signed)
Discussed bronch results with pt >> 7 node with non-caseating granulomas, most consistent with sarcoidosis. Will plan to follow cx data to completion. I do not believe FLOW cytometry was sent, but will follow and discuss with Pathology. We have follow up established in about 1 month.

## 2020-06-20 ENCOUNTER — Other Ambulatory Visit: Payer: Self-pay | Admitting: Internal Medicine

## 2020-06-20 DIAGNOSIS — F32A Depression, unspecified: Secondary | ICD-10-CM

## 2020-07-13 LAB — FUNGUS CULTURE WITH STAIN

## 2020-07-13 LAB — FUNGAL ORGANISM REFLEX

## 2020-07-13 LAB — FUNGUS CULTURE RESULT

## 2020-07-17 ENCOUNTER — Other Ambulatory Visit: Payer: Self-pay

## 2020-07-17 ENCOUNTER — Ambulatory Visit (INDEPENDENT_AMBULATORY_CARE_PROVIDER_SITE_OTHER): Payer: Medicare Other | Admitting: Emergency Medicine

## 2020-07-17 ENCOUNTER — Encounter: Payer: Self-pay | Admitting: Emergency Medicine

## 2020-07-17 DIAGNOSIS — D869 Sarcoidosis, unspecified: Secondary | ICD-10-CM | POA: Diagnosis not present

## 2020-07-17 NOTE — Patient Instructions (Signed)
We will plan to repeat your CT scan of the chest in November 2022 to follow sarcoidosis We will plan to repeat your pulmonary function testing in November 2022 Agree that you should get the COVID-19 booster immunization Agree that you would benefit from working with physical therapy.  This will help your breathing as well as your overall functional capacity Okay to keep albuterol available to use 2 puffs if needed for shortness of breath.  Keep track of whether you get any benefit. Follow with Dr. Delton Coombes in 12 months or sooner if you have any problems.

## 2020-07-17 NOTE — Progress Notes (Signed)
Subjective:    Patient ID: Lawrence Cantu, male    DOB: 1986/03/24, 35 y.o.   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    Referred for SOB and for lymphadenopathy found on CT chest done 03/30/2020. He reports that he has been experiencing some increased exertional SOB, also when sitting or supine. Over the last 2 weeks progressively worse. Increased pain meds about 4 months. Was started on Humira about 6-8 weeks ago, then to Cosyntex - only took twice and then was stopped due to the SOB.   A CT chest 03/30/2020 reviewed by me, showed no PE, generalized mediastinal and bilateral hilar lymphadenopathy up to 1.5 cm, no focal infiltrates, small 5 mm left upper lobe nodule.  [name] 05/29/20 --this follow-up visit 35 year old patient with a minimal tobacco history, hypertension, ankylosing spondylitis, DJD, cauda equina syndrome.  He was really referred for dyspnea and mediastinal/hilar lymphadenopathy.  He had been started on Humira about 2 months prior and there was some initial question that this may have been responsible.  I repeated his CT scan 05/10/2020 and this showed stable to improved adenopathy without any infiltrates, some possible mild left basilar pleural-based nodular disease. Based on that I told them to restart his biologics - he is now on Cosyntex  He underwent pulmonary function testing 05/16/2020 which I reviewed, shows possible evidence for restrictive disease on spirometry but the lung volumes are normal.  Normal diffusion capacity.  [name] 07/17/20 --35 year old gentleman with hypertension and ankylosing spondylitis and cauda equina syndrome whom I met for evaluation of mediastinal and hilar lymphadenopathy (on indiscretion therapy, has been on Cosentyx but is about to be changed to an alternative Remicade). He just started  MTX + folate.  He underwent EBUS to evaluate his adenopathy on 06/13/2020, noncaseating granulomas seen station 7.  Fungal culture, AFB culture, bacterial culture all negative and final. His CT chest 05/10/2020 shows stable mediastinal lymphadenopathy, hilar adenopathy stable small left pleural effusion, several right lower lobe nonspecific groundglass nodular opacities.  PFT with mixed disease as above.    Review of Systems As per HPI     Objective:   Physical Exam  Vitals:   07/17/20 0918  BP: 120/70  Pulse: 90  Temp: 97.6 F (36.4 C)  TempSrc: Temporal  SpO2: 97%  Weight: 231 lb 6.4 oz (105 kg)  Height: _0  (1.803 m)   Gen: Pleasant, well-nourished, in no distress,  normal affect  ENT: No lesions,  mouth clear,  oropharynx clear, no postnasal drip  Neck: No JVD, no stridor  Lungs: No use of accessory muscles, no crackles or wheezing on normal respiration, no wheeze on forced expiration  Cardiovascular: RRR, heart sounds normal, no murmur or gallops, no peripheral edema  Musculoskeletal: No deformities, no cyanosis or clubbing  Neuro: alert, awake, non focal  Skin: Warm, no lesions or rash      Assessment & Plan:  Sarcoidosis Based on noncaseating granulomas and station 7 noted on EBUS.  All cultures are negative.  There is no evidence of active parenchymal disease on CT chest, no clear obstructive lung disease by PFT.  He has not benefited significantly from albuterol but does have it available.  No evidence of current skin disease, etc. I have not found any connection between ankylosing spondylitis and granulomatous disease or sarcoidosis in the literature.  He will be  on initially methotrexate and then later Remicade for his AS, which may actually keep his sarcoidosis quiescent.  At this point would recommend repeat CT chest in 1 year, November 2022.  We will plan to repeat your CT scan of the chest in November 2022 to follow sarcoidosis We will plan to repeat your  pulmonary function testing in November 2022 Agree that you should get the COVID-19 booster immunization Agree that you would benefit from working with physical therapy.  This will help your breathing as well as your overall functional capacity Okay to keep albuterol available to use 2 puffs if needed for shortness of breath.  Keep track of whether you get any benefit. Follow with Dr. Lamonte Sakai in 12 months or sooner if you have any problems.  Baltazar Apo, MD, PhD 07/17/2020, 5:26 PM [facility] [phone] or if no answer [phone]

## 2020-07-17 NOTE — Assessment & Plan Note (Signed)
Based on noncaseating granulomas and station 7 noted on EBUS.  All cultures are negative.  There is no evidence of active parenchymal disease on CT chest, no clear obstructive lung disease by PFT.  He has not benefited significantly from albuterol but does have it available.  No evidence of current skin disease, etc. I have not found any connection between ankylosing spondylitis and granulomatous disease or sarcoidosis in the literature.  He will be on initially methotrexate and then later Remicade for his AS, which may actually keep his sarcoidosis quiescent.  At this point would recommend repeat CT chest in 1 year, November 2022.  We will plan to repeat your CT scan of the chest in November 2022 to follow sarcoidosis We will plan to repeat your pulmonary function testing in November 2022 Agree that you should get the COVID-19 booster immunization Agree that you would benefit from working with physical therapy.  This will help your breathing as well as your overall functional capacity Okay to keep albuterol available to use 2 puffs if needed for shortness of breath.  Keep track of whether you get any benefit. Follow with Dr. Delton Coombes in 12 months or sooner if you have any problems.

## 2020-07-27 LAB — ACID FAST CULTURE WITH REFLEXED SENSITIVITIES (MYCOBACTERIA): Acid Fast Culture: NEGATIVE

## 2020-08-25 ENCOUNTER — Other Ambulatory Visit: Payer: Self-pay | Admitting: Internal Medicine

## 2020-08-25 DIAGNOSIS — S343XXS Injury of cauda equina, sequela: Secondary | ICD-10-CM

## 2020-09-21 ENCOUNTER — Other Ambulatory Visit: Payer: Self-pay | Admitting: Internal Medicine

## 2020-09-21 DIAGNOSIS — F32A Depression, unspecified: Secondary | ICD-10-CM

## 2020-09-25 DIAGNOSIS — Z9689 Presence of other specified functional implants: Secondary | ICD-10-CM | POA: Insufficient documentation

## 2020-10-02 ENCOUNTER — Telehealth (INDEPENDENT_AMBULATORY_CARE_PROVIDER_SITE_OTHER): Payer: Medicare Other | Admitting: Internal Medicine

## 2020-10-02 DIAGNOSIS — F32A Depression, unspecified: Secondary | ICD-10-CM

## 2020-10-02 DIAGNOSIS — K921 Melena: Secondary | ICD-10-CM

## 2020-10-02 DIAGNOSIS — S343XXS Injury of cauda equina, sequela: Secondary | ICD-10-CM

## 2020-10-02 MED ORDER — TAMSULOSIN HCL 0.4 MG PO CAPS: 0.4000 mg | ORAL_CAPSULE | Freq: Every day | ORAL | 1 refills | Status: DC

## 2020-10-02 NOTE — Progress Notes (Signed)
Virtual Visit via Telephone Note  I connected with Lawrence Cantu, on 10/02/2020 at 2:11 PM by telephone due to the COVID-19 pandemic and verified that I am speaking with the correct person using two identifiers.   Consent: I discussed the limitations, risks, security and privacy concerns of performing an evaluation and management service by telephone and the availability of in person appointments. I also discussed with the patient that there may be a patient responsible charge related to this service. The patient expressed understanding and agreed to proceed.   Location of Patient: Home   Location of Provider: Clinic    Persons participating in Telemedicine visit: Jowel Waltner Pollock-CMA  Dr. Earlene Plater      History of Present Illness: Patient has a visit for medication refills.   Asks for refill for Flomax for history of cauda equina. Has a hard time finishing bladder.   Would also like to see GI doctor for some concerns. Having trouble with emptying bowels completely due to his history. Sometimes has to manually assist. Sees blood on stool and with wiping intermittently for the past 2-3 months. Occurs about once per week. No rectal pain. No bulging from rectum.   Does not believe he needs refill for Lexapro. He doesn't have any concerns with mood. He was seeing therapist prior to moving to [place]. Has not had time to establish with someone else.    Past Medical History:  Diagnosis Date  . Ankylosing spondylitis (HCC)   . Anxiety   . Arthritis    Phreesia 12/05/2019  . BPH (benign prostatic hyperplasia)   . Chronic pain   . DDD (degenerative disc disease), cervical   . DDD (degenerative disc disease), lumbar   . Depression    Phreesia 12/05/2019  . Enlarged prostate   . Hyperlipidemia   . Hypertension    pt denies  . Neuromuscular disorder (HCC)    Phreesia 12/05/2019  . Pneumonia    x 2 as a child, 1 x as an adult  . Post laminectomy syndrome   . Spinal stenosis     Allergies  Allergen Reactions  . Strawberry (Diagnostic) Anaphylaxis  . Other Hives    Cat Dander    Current Outpatient Medications on File Prior to Visit  Medication Sig Dispense Refill  . albuterol (VENTOLIN HFA) 108 (90 Base) MCG/ACT inhaler Inhale 2 puffs into the lungs every 4 (four) hours as needed for wheezing or shortness of breath (cough, shortness of breath or wheezing.). 1 each 1  . escitalopram (LEXAPRO) 20 MG tablet TAKE 1 TABLET (20 MG TOTAL) BY MOUTH IN THE MORNING. 30 tablet 0  . gabapentin (NEURONTIN) 600 MG tablet Take 1,200 mg by mouth in the morning, at noon, and at bedtime.     Marland Kitchen loratadine (CLARITIN) 10 MG tablet Take 10 mg by mouth at bedtime as needed for allergies.    . Melatonin 10 MG TABS Take 10 mg by mouth at bedtime.    Marland Kitchen morphine (MS CONTIN) 15 MG 12 hr tablet Take 15 mg by mouth 2 (two) times daily.     Marland Kitchen oxyCODONE (OXY IR/ROXICODONE) 5 MG immediate release tablet Take 5 mg by mouth in the morning, at noon, and at bedtime.     . tamsulosin (FLOMAX) 0.4 MG CAPS capsule TAKE 1 CAPSULE BY MOUTH EVERYDAY AT BEDTIME (Patient taking differently: Take 0.4 mg by mouth at bedtime.) 90 capsule 0  . tiZANidine (ZANAFLEX) 2 MG tablet Take 1 tablet (2 mg total) by mouth  3 (three) times daily as needed for muscle spasms. 30 tablet 0   No current facility-administered medications on file prior to visit.    Observations/Objective: NAD. Speaking clearly.  Work of breathing normal.  Alert and oriented. Mood appropriate.   Assessment and Plan: 1. Cauda equina spinal cord injury, sequela - tamsulosin (FLOMAX) 0.4 MG CAPS capsule; Take 1 capsule (0.4 mg total) by mouth at bedtime.  Dispense: 90 capsule; Refill: 1  2. Depression, unspecified depression type Mood stable. No safety concerns. Discussed some local places that have tele-therapy as options due to his inability to drive. Continue Lexapro.   3. Blood in stool Will place referral to GI given chronic blood in  stool and complicated history of cauda equina and difficulty with bowel movements. Discussed that blood could certainly be from straining and local, minor trauma of having to manually assist with BMs but warrants further assessment.  - Ambulatory referral to Gastroenterology   Follow Up Instructions: PRN and for routine medical care    I discussed the assessment and treatment plan with the patient. The patient was provided an opportunity to ask questions and all were answered. The patient agreed with the plan and demonstrated an understanding of the instructions.   The patient was advised to call back or seek an in-person evaluation if the symptoms worsen or if the condition fails to improve as anticipated.     I provided 9 minutes total of non-face-to-face time during this encounter including median intraservice time, reviewing previous notes, investigations, ordering medications, medical decision making, coordinating care and patient verbalized understanding at the end of the visit.    Marcy Siren, D.O. Primary Care at [facility]  10/02/2020, 2:11 PM

## 2020-10-18 ENCOUNTER — Encounter: Payer: Self-pay | Admitting: Physician Assistant

## 2020-10-25 ENCOUNTER — Other Ambulatory Visit: Payer: Self-pay | Admitting: Internal Medicine

## 2020-10-25 DIAGNOSIS — F32A Depression, unspecified: Secondary | ICD-10-CM

## 2020-11-16 ENCOUNTER — Ambulatory Visit: Payer: Medicare Other | Admitting: Physician Assistant

## 2020-12-05 ENCOUNTER — Other Ambulatory Visit: Payer: Self-pay

## 2020-12-05 DIAGNOSIS — E669 Obesity, unspecified: Secondary | ICD-10-CM | POA: Insufficient documentation

## 2020-12-05 DIAGNOSIS — M5030 Other cervical disc degeneration, unspecified cervical region: Secondary | ICD-10-CM | POA: Insufficient documentation

## 2020-12-05 DIAGNOSIS — M461 Sacroiliitis, not elsewhere classified: Secondary | ICD-10-CM | POA: Insufficient documentation

## 2020-12-11 ENCOUNTER — Other Ambulatory Visit (INDEPENDENT_AMBULATORY_CARE_PROVIDER_SITE_OTHER): Payer: Medicare Other

## 2020-12-11 ENCOUNTER — Ambulatory Visit (INDEPENDENT_AMBULATORY_CARE_PROVIDER_SITE_OTHER): Payer: Medicare Other | Admitting: Nurse Practitioner

## 2020-12-11 ENCOUNTER — Encounter: Payer: Self-pay | Admitting: Nurse Practitioner

## 2020-12-11 VITALS — BP 112/78 | HR 84 | Ht 70.0 in | Wt 229.0 lb

## 2020-12-11 DIAGNOSIS — R150 Incomplete defecation: Secondary | ICD-10-CM

## 2020-12-11 DIAGNOSIS — K625 Hemorrhage of anus and rectum: Secondary | ICD-10-CM | POA: Diagnosis not present

## 2020-12-11 DIAGNOSIS — K5900 Constipation, unspecified: Secondary | ICD-10-CM | POA: Diagnosis not present

## 2020-12-11 LAB — CBC WITH DIFFERENTIAL/PLATELET
Basophils Absolute: 0.1 10*3/uL (ref 0.0–0.1)
Basophils Relative: 1.4 % (ref 0.0–3.0)
Eosinophils Absolute: 0.5 10*3/uL (ref 0.0–0.7)
Eosinophils Relative: 6.5 % — ABNORMAL HIGH (ref 0.0–5.0)
HCT: 41.8 % (ref 39.0–52.0)
Hemoglobin: 14.8 g/dL (ref 13.0–17.0)
Lymphocytes Relative: 24.2 % (ref 12.0–46.0)
Lymphs Abs: 1.8 10*3/uL (ref 0.7–4.0)
MCHC: 35.4 g/dL (ref 30.0–36.0)
MCV: 84.9 fl (ref 78.0–100.0)
Monocytes Absolute: 0.8 10*3/uL (ref 0.1–1.0)
Monocytes Relative: 10.7 % (ref 3.0–12.0)
Neutro Abs: 4.2 10*3/uL (ref 1.4–7.7)
Neutrophils Relative %: 57.2 % (ref 43.0–77.0)
Platelets: 279 10*3/uL (ref 150.0–400.0)
RBC: 4.92 Mil/uL (ref 4.22–5.81)
RDW: 14.6 % (ref 11.5–15.5)
WBC: 7.2 10*3/uL (ref 4.0–10.5)

## 2020-12-11 MED ORDER — SUPREP BOWEL PREP KIT 17.5-3.13-1.6 GM/177ML PO SOLN: 1.0000 | ORAL | 0 refills | Status: DC

## 2020-12-11 NOTE — Progress Notes (Addendum)
12/11/2020 Lawrence Cantu [id number] Jun 03, 1986   CHIEF COMPLAINT: incomplete stool evacuation, rectal bleeding    HISTORY OF PRESENT ILLNESS: Lawrence Cantu is a 35 year old male with a past medical history of anxiety, depression, cauda equina syndrome initially diagnosed 7 years ago s/p spinal surgery x 2, ankylosing spondylitis on Methotrexate and Renflexis and sarcoidosis which was diagnosed 06/2020.  He presents to our office today as referred by Dr. Benita Stabile for further evaluation regarding rectal bleeding and difficulty passing bowel movements.  He is accompanied by his wife.  He complains of going to the bathroom to sit on the commode and nothing happens.  He has a diminished urge to defecate and he sits on the commode until he  passes a small amount of stool or he manually disimpact himself which occurs about 90% of the time.  His stool consistency varies, stool is soft, formed balls or  normally formed stool.  No diarrhea. He stated when he stands up to urinate the flood gates open but he cannot completely empty his bladder.  He returns to the bathroom within 15 to 30 minutes to finish urinating.  During sexual intercourse with his wife,  he has difficulty with ejaculation, semen does not empty completely and he manually applies pressure to himself to push the semen out.  He stated his symptoms of having difficulty releasing stool, urine and semen have progressively worsened over the past year.  He reports seeing a small amount of bright red blood on the toilet tissue which occurred prior to manually removing stool but has worsened since then.  He describes seeing a moderate amount of bright red blood which turns the toilet bowl red once or twice monthly.  No nausea or vomiting.  No dysphagia or heartburn.  No specific upper or lower abdominal pain.  He is followed by [facility] for pain management.  He has a spinal cord stimulator implant.  He takes MS Contin  15 mg 1 tab twice daily and oxycodone 5 mg 1 3 times daily.  He occasionally takes ibuprofen.  No known family history of colorectal cancer or IBD.  Anxiety and depression are well controlled on Lexapro.  He sees a therapist on a regular basis.  No other complaints at this time.  Past Medical History:  Diagnosis Date  . Ankylosing spondylitis (HCC)   . Anxiety   . Arthritis    Phreesia 12/05/2019  . BPH (benign prostatic hyperplasia)   . Cauda equina syndrome (HCC)   . Chronic pain   . DDD (degenerative disc disease), cervical   . DDD (degenerative disc disease), lumbar   . Depression    Phreesia 12/05/2019  . Enlarged prostate   . Hyperlipidemia   . Hypertension    pt denies  . Neuromuscular disorder (HCC)    Phreesia 12/05/2019  . Pneumonia    x 2 as a child, 1 x as an adult  . Post laminectomy syndrome   . Sarcoidosis   . Spinal stenosis    Past Surgical History:  Procedure Laterality Date  . BRONCHIAL WASHINGS  06/13/2020   Procedure: BRONCHIAL WASHINGS;  Surgeon: Leslye Peer, MD;  Location: [facility] ENDOSCOPY;  Service: Pulmonary;;  . CERVICAL FUSION    . FINE NEEDLE ASPIRATION  06/13/2020   Procedure: FINE NEEDLE ASPIRATION (FNA) LINEAR;  Surgeon: Leslye Peer, MD;  Location: [facility] ENDOSCOPY;  Service: Pulmonary;;  . IR FL GUIDED LOC OF NEEDLE/CATH TIP FOR SPINAL  INJECTION LT     has had at least 10 of these  . LAMINECTOMY AND MICRODISCECTOMY LUMBAR SPINE    . SPINAL CORD STIMULATOR IMPLANT  ?2018  . SPINAL FUSION    . SPINE SURGERY N/A    Phreesia 12/05/2019  . VIDEO BRONCHOSCOPY WITH ENDOBRONCHIAL ULTRASOUND Bilateral 06/13/2020   Procedure: VIDEO BRONCHOSCOPY WITH ENDOBRONCHIAL ULTRASOUND;  Surgeon: Leslye Peer, MD;  Location: [facility] ENDOSCOPY;  Service: Pulmonary;  Laterality: Bilateral;   Social History: Married. Smoked cigarettes for 6 to 7 years, quit smoking 6 to 7 years ago. Infrequent alcohol intake. No drug use.    Family History: Father with history of  depression, diverticulitis and thyroid cancer.  Mother with history of breast cancer and depression.  Brother and sister with history of depression.  Maternal grandmother with history of atrial fibrillation and heart disease.   Allergies  Allergen Reactions  . Strawberry (Diagnostic) Anaphylaxis  . Other Hives    Cat Dander      Outpatient Encounter Medications as of 12/11/2020  Medication Sig  . escitalopram (LEXAPRO) 20 MG tablet Take 1 tablet (20 mg total) by mouth in the morning. Dx: Ata.Sales.A  . folic acid (FOLVITE) 1 MG tablet TAKE 1 TABLET BY MOUTH EVERY DAY FOR 30 DAYS  . gabapentin (NEURONTIN) 600 MG tablet Take 1,200 mg by mouth in the morning, at noon, and at bedtime.   . inFLIXimab-abda (RENFLEXIS) 100 MG SOLR 5mg /kg  . loratadine (CLARITIN) 10 MG tablet Take 10 mg by mouth at bedtime as needed for allergies.  . Melatonin 10 MG TABS Take 10 mg by mouth at bedtime.  . methotrexate (RHEUMATREX) 2.5 MG tablet Take 20 mg by mouth once a week.  . Methotrexate 10 MG/0.4ML SOSY   . morphine (MS CONTIN) 15 MG 12 hr tablet Take 15 mg by mouth 2 (two) times daily.   oxyCODONE (OXY IR/ROXICODONE) 5 MG immediate release tablet Take 5 mg by mouth in the morning, at noon, and at bedtime.   . tamsulosin (FLOMAX) 0.4 MG CAPS capsule Take 1 capsule (0.4 mg total) by mouth at bedtime.  Marland Kitchen tiZANidine (ZANAFLEX) 2 MG tablet Take 1 tablet (2 mg total) by mouth 3 (three) times daily as needed for muscle spasms.  . [DISCONTINUED] Adalimumab (HUMIRA) 40 MG/0.8ML PSKT   . [DISCONTINUED] albuterol (VENTOLIN HFA) 108 (90 Base) MCG/ACT inhaler Inhale 2 puffs into the lungs every 4 (four) hours as needed for wheezing or shortness of breath (cough, shortness of breath or wheezing.).  . [DISCONTINUED] predniSONE (DELTASONE) 20 MG tablet TAKE 1 TABLET BY MOUTH EVERY DAY FOR 30 DAYS   No facility-administered encounter medications on file as of 12/11/2020.    REVIEW OF SYSTEMS: Gen: + Fatigue. Denies fever,  sweats or chills. No weight loss.  CV: Denies chest pain, palpitations or edema. Resp: + shortness of breath. No hemoptysis.  GI: See HPI. GU : Decreased urinary flow.  MS: Chronic back pain, + arthritis. Derm: Denies rash, itchiness, skin lesions or unhealing ulcers. Psych: + Anxiety and depression.  Heme: Denies bruising, bleeding. Neuro:  + paresthesias. Denies headaches or dizziness.  Endo:  Denies any problems with DM, thyroid or adrenal function.   PHYSICAL EXAM: BP 112/78 (BP Location: Left Arm, Patient Position: Sitting, Cuff Size: Normal)   Pulse 84   Ht 5\' 10"  (1.778 m) Comment: height measured without shoes  Wt 229 lb (103.9 kg)   BMI 32.86 kg/m    General: 35 year old male in  no acute distress. Head: Normocephalic and atraumatic. Eyes:  Sclerae non-icteric, conjunctive pink. Ears: Normal auditory acuity. Mouth: Dentition intact. No ulcers or lesions.  Neck: Supple, no lymphadenopathy or thyromegaly.  Lungs: Clear bilaterally to auscultation without wheezes, crackles or rhonchi. Heart: Regular rate and rhythm. No murmur, rub or gallop appreciated.  Abdomen: Soft, nontender, non distended. No masses. No hepatosplenomegaly. Normoactive bowel sounds x 4 quadrants.  Rectal: No anal fissure. Small non-inflamed anal hemorrhoids. Adequate anal sphincter tone. No mass. No blood or stool in the rectal vault.  Musculoskeletal: Symmetrical with no gross deformities.  CMA present during exam. Skin: Warm and dry. No rash or lesions on visible extremities. Extremities: No edema. Neurological: Alert oriented x 4, no focal deficits.  Psychological:  Alert and cooperative. Normal mood and affect.  ASSESSMENT AND PLAN:  77. 35 year old male with a history of cauda equina s/p spinal surgery x 2 with incomplete stool evacuation with rectal bleeding which has progressively worsened over the past year. -Diagnostic colonoscopy, rule out distal colorectal mass/ulcerations/GI  sarcoidosis/IBD. -Consider eventual anal manometry  -? rectal/pelvic MRI w/wo contrast  -CBC, CMP  2. 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 Dulcolax suppository every 2 - 3 nights as needed   3. Ankylosing Spondylitis on MTX and Renflexis infusions followed by rheumatology (Humira discontinued secondary to associated SOB)  4. Pulmonary Sarcoidosis, followed by [facility]mon[facility]   5. History of BPH. Urinary retention, diminished urinary flow and difficulty with semen release -Recommend urology evaluation, to discuss further with PCP   Further recommendations to be determined after the above evaluation completed    CC:  [facility]*

## 2020-12-11 NOTE — Patient Instructions (Signed)
If you are age 36 or younger, your body mass index should be between 19-25. Your Body mass index is 32.86 kg/m. If this is out of the aformentioned range listed, please consider follow up with your Primary Care Provider.   LABS:  Lab work has been ordered for you today. Our lab is located in the basement. Press "B" on the elevator. The lab is located at the first door on the left as you exit the elevator.  HEALTHCARE LAWS AND MY CHART RESULTS: Due to recent changes in healthcare laws, you may see the results of your imaging and laboratory studies on MyChart before your provider has had a chance to review them.   We understand that in some cases there may be results that are confusing or concerning to you. Not all laboratory results come back in the same time frame and the provider may be waiting for multiple results in order to interpret others.  Please give [place] 48 hours in order for your provider to thoroughly review all the results before contacting the office for clarification of your results.   PROCEDURES: You have been scheduled for a colonoscopy. Please follow the written instructions given to you at your visit today. Please pick up your prep supplies at the pharmacy within the next 1-3 days. If you use inhalers (even only as needed), please bring them with you on the day of your procedure.  RECOMMENDATIONS: Desitin: Apply a small amount to the external anal area three times a day as needed. Glycerin suppository at night. Dulcolax suppository every 2-3 nights as needed. Use Vaseline to lubricate when manually removing stool. Eventually will need rectal physical therapy.  It was great seeing you today! Thank you for entrusting me with your care and choosing [facility].  Arnaldo Natal, CRNP  The [facility] providers would like to encourage you to use [facility] to communicate with providers for non-urgent requests or questions.  Due to long hold times on the telephone,  sending your provider a message by [facility] may be faster and more efficient way to get a response. Please allow 48 business hours for a response.  Please remember that this is for non-urgent requests/questions.

## 2020-12-12 LAB — COMPREHENSIVE METABOLIC PANEL
ALT: 31 U/L (ref 0–53)
AST: 22 U/L (ref 0–37)
Albumin: 4.5 g/dL (ref 3.5–5.2)
Alkaline Phosphatase: 56 U/L (ref 39–117)
BUN: 16 mg/dL (ref 6–23)
CO2: 24 mEq/L (ref 19–32)
Calcium: 9.5 mg/dL (ref 8.4–10.5)
Chloride: 104 mEq/L (ref 96–112)
Creatinine, Ser: 0.9 mg/dL (ref 0.40–1.50)
GFR: 110.61 mL/min (ref >60.00)
Glucose, Bld: 87 mg/dL (ref 70–99)
Potassium: 4.2 mEq/L (ref 3.5–5.1)
Sodium: 138 mEq/L (ref 135–145)
Total Bilirubin: 0.5 mg/dL (ref 0.2–1.2)
Total Protein: 7.4 g/dL (ref 6.0–8.3)

## 2020-12-13 NOTE — Progress Notes (Signed)
Agree with assessment and plan as outlined. Will plan on colonoscopy first to evaluate this issue, will hold off on MRI for now.

## 2020-12-13 NOTE — Progress Notes (Signed)
I was not supervising physician that clinic day.  It looks like the patient has been scheduled with Dr. Adela Lank.  - HD

## 2020-12-14 NOTE — Progress Notes (Signed)
Reviewed and agree with documentation and assessment and plan. K. Veena Zhoey Blackstock , MD   

## 2020-12-18 ENCOUNTER — Telehealth: Payer: Self-pay | Admitting: Internal Medicine

## 2020-12-18 NOTE — Telephone Encounter (Signed)
Pt called in asking for a referral to Neurology.

## 2020-12-18 NOTE — Progress Notes (Signed)
Virtual Visit via Telephone Note  I connected with Lawrence Cantu, on 12/19/2020 at 1:40 PM by telephone due to the COVID-19 pandemic and verified that I am speaking with the correct person using two identifiers.  Due to current restrictions/limitations of in-office visits due to the COVID-19 pandemic, this scheduled clinical appointment was converted to a telehealth visit.   Consent: I discussed the limitations, risks, security and privacy concerns of performing an evaluation and management service by telephone and the availability of in person appointments. I also discussed with the patient that there may be a patient responsible charge related to this service. The patient expressed understanding and agreed to proceed.   Location of Patient: Home  Location of Provider: [facility] [place] participating in Telemedicine visit: Denzal Ferencz Durene Fruits, NP Elmon Else, CMA  History of Present Illness: Lawrence Cantu is a 35 year-old male who presents for neurology referral.   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 Had a spinal stimulator in the past and surgical fusions. Neck pain getting worse. Dropping things more and bilateral arm pain radiating to both hands. Has a colonoscopy scheduled in a few days. Was told during a general exam in preparation for colonoscopy that symptoms seem more nerve related. Requesting referral to Neurology.    Past Medical History:  Diagnosis Date   Ankylosing spondylitis ([place])    Anxiety    Arthritis    Phreesia 12/05/2019   BPH (benign prostatic hyperplasia)    Cauda equina syndrome (HCC)    Chronic pain    DDD (degenerative disc disease), cervical    DDD (degenerative disc disease), lumbar    Depression    Phreesia 12/05/2019   Enlarged prostate    Hyperlipidemia    Hypertension    pt denies   Neuromuscular disorder ([place])     Phreesia 12/05/2019   Pneumonia    x 2 as a child, 1 x as an adult   Post laminectomy syndrome    Sarcoidosis    Spinal stenosis    Allergies  Allergen Reactions   Strawberry (Diagnostic) Anaphylaxis   Other Hives    Cat Dander    Current Outpatient Medications on File Prior to Visit  Medication Sig Dispense Refill   escitalopram (LEXAPRO) 20 MG tablet Take 1 tablet (20 mg total) by mouth in the morning. Dx: Matti.Burr.A 90 tablet 0   folic acid (FOLVITE) 1 MG tablet TAKE 1 TABLET BY MOUTH EVERY DAY FOR 30 DAYS     gabapentin (NEURONTIN) 600 MG tablet Take 1,200 mg by mouth in the morning, at noon, and at bedtime.      inFLIXimab-abda (RENFLEXIS) 100 MG SOLR 41m/kg     loratadine (CLARITIN) 10 MG tablet Take 10 mg by mouth at bedtime as needed for allergies.     Melatonin 10 MG TABS Take 10 mg by mouth at bedtime.     methotrexate (RHEUMATREX) 2.5 MG tablet Take 20 mg by mouth once a week.     Methotrexate 10 MG/0.4ML SOSY      morphine (MS CONTIN) 15 MG 12 hr tablet Take 15 mg by mouth 2 (two) times daily.      oxyCODONE (OXY IR/ROXICODONE) 5 MG immediate release tablet Take 5 mg by mouth in the morning, at noon, and at bedtime.      SUPREP BOWEL PREP KIT 17.5-3.13-1.6 GM/177ML SOLN Take 1 kit by mouth as directed. For  colonoscopy prep 354 mL 0   tamsulosin (FLOMAX) 0.4 MG CAPS capsule Take 1 capsule (0.4 mg total) by mouth at bedtime. 90 capsule 1   tiZANidine (ZANAFLEX) 2 MG tablet Take 1 tablet (2 mg total) by mouth 3 (three) times daily as needed for muscle spasms. 30 tablet 0   No current facility-administered medications on file prior to visit.    Observations/Objective: Alert and oriented x 3. Not in acute distress. Physical examination not completed as this is a telemedicine visit.  Assessment and Plan: 1. Ankylosing spondylitis lumbar region [facility]): 2. Cauda equina spinal cord injury, sequela: - Per patient request referral to Neurology for further evaluation and management.   - Ambulatory referral to Neurology   Follow Up Instructions: Referral to Neurology.    Patient was given clear instructions to go to Emergency Department or return to medical center if symptoms don't improve, worsen, or new problems develop.The patient verbalized understanding.  I discussed the assessment and treatment plan with the patient. The patient was provided an opportunity to ask questions and all were answered. The patient agreed with the plan and demonstrated an understanding of the instructions.   The patient was advised to call back or seek an in-person evaluation if the symptoms worsen or if the condition fails to improve as anticipated.   I provided 5 minutes total of non-face-to-face time during this encounter.   Camillia Herter, NP  [facility] [place], [place] 12/19/2020, 1:40 PM

## 2020-12-19 ENCOUNTER — Other Ambulatory Visit: Payer: Self-pay

## 2020-12-19 ENCOUNTER — Encounter: Payer: Self-pay | Admitting: Emergency Medicine

## 2020-12-19 ENCOUNTER — Ambulatory Visit (INDEPENDENT_AMBULATORY_CARE_PROVIDER_SITE_OTHER): Payer: Medicare Other | Admitting: Emergency Medicine

## 2020-12-19 ENCOUNTER — Telehealth (INDEPENDENT_AMBULATORY_CARE_PROVIDER_SITE_OTHER): Payer: Medicare Other | Admitting: Family

## 2020-12-19 DIAGNOSIS — D860 Sarcoidosis of lung: Secondary | ICD-10-CM | POA: Diagnosis not present

## 2020-12-19 DIAGNOSIS — R29818 Other symptoms and signs involving the nervous system: Secondary | ICD-10-CM | POA: Diagnosis not present

## 2020-12-19 DIAGNOSIS — M456 Ankylosing spondylitis lumbar region: Secondary | ICD-10-CM

## 2020-12-19 DIAGNOSIS — S343XXS Injury of cauda equina, sequela: Secondary | ICD-10-CM

## 2020-12-19 NOTE — Patient Instructions (Signed)
We will arrange for home sleep study to investigate for possible obstructive sleep apnea.

## 2020-12-19 NOTE — Progress Notes (Signed)
Subjective:    Patient ID: Lawrence Cantu, male    DOB: 12/28/1985, 35 y.o.   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   [name] 07/17/20 --35-year-old gentleman with hypertension and ankylosing spondylitis and cauda equina syndrome whom I met for evaluation of mediastinal and hilar lymphadenopathy (on indiscretion therapy, has been on Cosentyx but is about to be changed to an alternative Remicade). He just started MTX + folate.  He underwent EBUS to evaluate his adenopathy on 06/13/2020, noncaseating granulomas seen station 7.  Fungal culture, AFB culture, bacterial culture all negative and final. His CT chest 05/10/2020 shows stable mediastinal lymphadenopathy, hilar adenopathy stable small left pleural effusion, several right lower lobe nonspecific groundglass nodular opacities.  PFT with mixed disease as above.   [name] 12/19/20 --35-year-old man with hypertension and ankylosing spondylitis, cauda equina syndrome.  Is been treated with Remicade, methotrexate and folate.  We diagnosed him with presumed sarcoidosis based on granulomatous disease on endobronchial ultrasound.  All of his cultures were negative.  He has grossly normal pulmonary function testing, possible restriction. Currently on MTX and infliximab through his Rheumatologist. He has more energy and his AS is better. His breathing OK. Has albuterol, doesn't really get much out of it. Denies any rash, wheezing.  His wife questions possible OSA, he has snoring, witnessed apneas. His wt is stable. Has tried nasal strips. He is tired during day and takes naps.   Time spent 22 minutes   Review of Systems As per HPI     Objective:   Physical Exam  Vitals:   12/19/20 1022  BP: 126/80  Pulse: 91  Temp: 97.8 F (36.6 C)  TempSrc: Temporal  SpO2: 95%  Weight: 228 lb  6.4 oz (103.6 kg)  Height: 5' 10" (1.778 m)   Gen: Pleasant, well-nourished, in no distress,  normal affect  ENT: No lesions,  mouth clear,  oropharynx clear, no postnasal drip  Neck: No JVD, no stridor  Lungs: No use of accessory muscles, no crackles or wheezing on normal respiration, no wheeze on forced expiration  Cardiovascular: RRR, heart sounds normal, no murmur or gallops, no peripheral edema  Musculoskeletal: No deformities, no cyanosis or clubbing  Neuro: alert, awake, non focal  Skin: Warm, no lesions or rash      Assessment & Plan:  Suspected sleep apnea He has snoring, witnessed apneas, daytime sleepiness and takes naps.  Suspicion for OSA high.  We will obtain a home sleep study and if confirmed then consider initiation of AutoSet CPAP.  Sarcoidosis of lung (HCC) With lymphadenopathy.  He is currently on methotrexate and infliximab for his ankylosing spondylitis, likely also suppressing, benefiting sarcoidosis.  We are planning for surveillance CT chest in November 2022.  No significant obstruction on pulmonary function testing he does have an albuterol inhaler available to use if he needs it.  Robert Byrum, MD, PhD 12/19/2020, 10:40 AM [facility] [phone] or if no answer [phone]  

## 2020-12-19 NOTE — Assessment & Plan Note (Signed)
He has snoring, witnessed apneas, daytime sleepiness and takes naps.  Suspicion for OSA high.  We will obtain a home sleep study and if confirmed then consider initiation of AutoSet CPAP.

## 2020-12-19 NOTE — Assessment & Plan Note (Signed)
With lymphadenopathy.  He is currently on methotrexate and infliximab for his ankylosing spondylitis, likely also suppressing, benefiting sarcoidosis.  We are planning for surveillance CT chest in November 2022.  No significant obstruction on pulmonary function testing he does have an albuterol inhaler available to use if he needs it.

## 2020-12-19 NOTE — Progress Notes (Signed)
Pt presents to get neurology referral due to spinal issues

## 2020-12-19 NOTE — Addendum Note (Signed)
Addended by: Dorisann Frames R on: 12/19/2020 12:14 PM   Modules accepted: Orders

## 2020-12-22 ENCOUNTER — Telehealth: Payer: Self-pay

## 2020-12-22 ENCOUNTER — Ambulatory Visit (AMBULATORY_SURGERY_CENTER): Payer: Medicare Other | Admitting: Gastroenterology

## 2020-12-22 ENCOUNTER — Other Ambulatory Visit: Payer: Self-pay

## 2020-12-22 ENCOUNTER — Encounter: Payer: Self-pay | Admitting: Gastroenterology

## 2020-12-22 VITALS — BP 117/65 | HR 67 | Temp 97.1°F | Resp 10 | Ht 70.0 in | Wt 229.0 lb

## 2020-12-22 DIAGNOSIS — K625 Hemorrhage of anus and rectum: Secondary | ICD-10-CM

## 2020-12-22 DIAGNOSIS — R150 Incomplete defecation: Secondary | ICD-10-CM

## 2020-12-22 DIAGNOSIS — K648 Other hemorrhoids: Secondary | ICD-10-CM | POA: Diagnosis not present

## 2020-12-22 DIAGNOSIS — K5900 Constipation, unspecified: Secondary | ICD-10-CM

## 2020-12-22 DIAGNOSIS — K602 Anal fissure, unspecified: Secondary | ICD-10-CM

## 2020-12-22 MED ORDER — AMBULATORY NON FORMULARY MEDICATION: 1.0000 | Freq: Three times a day (TID) | 0 refills | Status: DC

## 2020-12-22 MED ORDER — AMBULATORY NON FORMULARY MEDICATION
0 refills | Status: DC
Start: 2020-12-22 — End: 2021-09-07

## 2020-12-22 MED ORDER — SODIUM CHLORIDE 0.9 % IV SOLN: 500.0000 mL | Freq: Once | INTRAVENOUS | Status: DC

## 2020-12-22 NOTE — Patient Instructions (Signed)
Discharge instructions given. Handout on Hemorrhoids. Prescription given. Recommend anorectal manometry to further evaluate for pelvic floor dysfunction/nerve damage. Office will call to coordinate. YOU HAD AN ENDOSCOPIC PROCEDURE TODAY AT THE [facility]:   Refer to the procedure report that was given to you for any specific questions about what was found during the examination.  If the procedure report does not answer your questions, please call your gastroenterologist to clarify.  If you requested that your care partner not be given the details of your procedure findings, then the procedure report has been included in a sealed envelope for you to review at your convenience later.  YOU SHOULD EXPECT: Some feelings of bloating in the abdomen. Passage of more gas than usual.  Walking can help get rid of the air that was put into your GI tract during the procedure and reduce the bloating. If you had a lower endoscopy (such as a colonoscopy or flexible sigmoidoscopy) you may notice spotting of blood in your stool or on the toilet paper. If you underwent a bowel prep for your procedure, you may not have a normal bowel movement for a few days.  Please Note:  You might notice some irritation and congestion in your nose or some drainage.  This is from the oxygen used during your procedure.  There is no need for concern and it should clear up in a day or so.  SYMPTOMS TO REPORT IMMEDIATELY:  Following lower endoscopy (colonoscopy or flexible sigmoidoscopy):  Excessive amounts of blood in the stool  Significant tenderness or worsening of abdominal pains  Swelling of the abdomen that is new, acute  Fever of 100F or higher   For urgent or emergent issues, a gastroenterologist can be reached at any hour by calling [phone]. Do not use MyChart messaging for urgent concerns.    DIET:  We do recommend a small meal at first, but then you may proceed to your regular diet.  Drink plenty of  fluids but you should avoid alcoholic beverages for 24 hours.  ACTIVITY:  You should plan to take it easy for the rest of today and you should NOT DRIVE or use heavy machinery until tomorrow (because of the sedation medicines used during the test).    FOLLOW UP: Our staff will call the number listed on your records 48-72 hours following your procedure to check on you and address any questions or concerns that you may have regarding the information given to you following your procedure. If we do not reach you, we will leave a message.  We will attempt to reach you two times.  During this call, we will ask if you have developed any symptoms of COVID 19. If you develop any symptoms (ie: fever, flu-like symptoms, shortness of breath, cough etc.) before then, please call [phone].  If you test positive for Covid 19 in the 2 weeks post procedure, please call and report this information to [place].    If any biopsies were taken you will be contacted by phone or by letter within the next 1-3 weeks.  Please call us at [phone] if you have not heard about the biopsies in 3 weeks.    SIGNATURES/CONFIDENTIALITY: You and/or your care partner have signed paperwork which will be entered into your electronic medical record.  These signatures attest to the fact that that the information above on your After Visit Summary has been reviewed and is understood.  Full responsibility of the confidentiality of this discharge information lies  with you and/or your care-partner.  

## 2020-12-22 NOTE — Progress Notes (Signed)
VS by CW  I have reviewed the patient's medical history in detail and updated the computerized patient record.  

## 2020-12-22 NOTE — Progress Notes (Signed)
A/ox3, pleased with MAC, report to RN 

## 2020-12-22 NOTE — Telephone Encounter (Signed)
-----   Message from Benancio Deeds, MD sent at 12/22/2020  3:54 PM EDT ----- Regarding: manometry Lawrence Cantu can you please contact this patient sometime next week to coordinate anorectal manometry? Thanks

## 2020-12-22 NOTE — Op Note (Signed)
[facility] Patient Name: Lawrence AgresteSean Seubert Procedure Date: 12/22/2020 1:24 PM MRN: [id number] Endoscopist: Viviann SpareSteven P. Adela LankArmbruster , MD Age: [age] Referring MD:  Date of Birth: April 13, 1986 Gender: Male Account #: [id number] Procedure:                Colonoscopy Indications:              Rectal bleeding - history of cauda equina,                            ankylosing spondylitis - sense of incomplete                            evacuation and loss of rectal sensation, subjective                            concern for pelvic floor dysfunction Medicines:                Monitored Anesthesia Care Procedure:                Pre-Anesthesia Assessment:                           - Prior to the procedure, a History and Physical                            was performed, and patient medications and                            allergies were reviewed. The patient's tolerance of                            previous anesthesia was also reviewed. The risks                            and benefits of the procedure and the sedation                            options and risks were discussed with the patient.                            All questions were answered, and informed consent                            was obtained. Prior Anticoagulants: The patient has                            taken no previous anticoagulant or antiplatelet                            agents. ASA Grade Assessment: II - A patient with                            mild systemic disease. After reviewing the risks  and benefits, the patient was deemed in                            satisfactory condition to undergo the procedure.                           After obtaining informed consent, the colonoscope                            was passed under direct vision. Throughout the                            procedure, the patient's blood pressure, pulse, and                            oxygen saturations were  monitored continuously. The                            Olympus CF-HQ190 [id number]) Colonoscope was                            introduced through the anus and advanced to the the                            terminal ileum, with identification of the                            appendiceal orifice and IC valve. The colonoscopy                            was performed without difficulty. The patient                            tolerated the procedure well. The quality of the                            bowel preparation was good. The terminal ileum,                            ileocecal valve, appendiceal orifice, and rectum                            were photographed. Scope In: 1:35:44 PM Scope Out: 1:53:07 PM Scope Withdrawal Time: 0 hours 14 minutes 34 seconds  Total Procedure Duration: 0 hours 17 minutes 23 seconds  Findings:                 An anal fissure was found on perianal exam in the                            posterior midline anal canal.                           The terminal ileum appeared normal.  Internal hemorrhoids were found during retroflexion.                           The exam was otherwise without abnormality. Complications:            No immediate complications. Estimated blood loss:                            None. Estimated Blood Loss:     Estimated blood loss: none. Impression:               - Anal fissure found on perianal exam.                           - The examined portion of the ileum was normal.                           - Internal hemorrhoids.                           - The examination was otherwise normal.                           - No polyps.                           Patient is having rectal bleeding likely from                            combination of fissure / hemorrhoids in the setting                            of bowel changes as outlined. Recommend anorectal                            manometry to further evaluate for  nerve damage                            given clinical history. Recommendation:           - Patient has a contact number available for                            emergencies. The signs and symptoms of potential                            delayed complications were discussed with the                            patient. Return to normal activities tomorrow.                            Written discharge instructions were provided to the                            patient.                           -  Resume previous diet.                           - Continue present medications.                           - Recommend diltiazem / lidocaine ointment, pea                            sized amount TID for anal fissure - at [facility]                           - Repeat colonoscopy in 10 years for screening                            purposes.                           - Recommend anorectal manometry to further evaluate                            for pelvic floor dysfunction / nerve damage, our                            office will call to coordinate Viviann Spare P. Humphrey Guerreiro, MD 12/22/2020 1:59:53 PM This report has been signed electronically.

## 2020-12-25 ENCOUNTER — Other Ambulatory Visit: Payer: Self-pay

## 2020-12-25 DIAGNOSIS — K625 Hemorrhage of anus and rectum: Secondary | ICD-10-CM

## 2020-12-25 DIAGNOSIS — R150 Incomplete defecation: Secondary | ICD-10-CM

## 2020-12-25 DIAGNOSIS — K5900 Constipation, unspecified: Secondary | ICD-10-CM

## 2020-12-25 NOTE — Telephone Encounter (Signed)
Patient has been scheduled for an ano rectal manometry at [facility] on Wednesday, 01/17/21 at 12:30 PM. Instructions sent to patient via my chart. Patient is aware of appt. Advised that once he receives the instructions if he has any questions to give [place] a call or send [place] a my chart message. Patient verbalized understanding of all information and had no concerns at the end of the call.

## 2020-12-26 ENCOUNTER — Telehealth: Payer: Self-pay

## 2020-12-26 NOTE — Telephone Encounter (Signed)
  Follow up Call-  Call back number 12/22/2020  Post procedure Call Back phone  # [phone]  Permission to leave phone message Yes     Patient questions:  Do you have a fever, pain , or abdominal swelling? No. Pain Score  0 *  Have you tolerated food without any problems? Yes.    Have you been able to return to your normal activities? Yes.    Do you have any questions about your discharge instructions: Diet   No. Medications  No. Follow up visit  No.  Do you have questions or concerns about your Care? No.  Actions: * If pain score is 4 or above: No action needed, pain <4.  Have you developed a fever since your procedure? no  2.   Have you had an respiratory symptoms (SOB or cough) since your procedure? no  3.   Have you tested positive for COVID 19 since your procedure no  4.   Have you had any family members/close contacts diagnosed with the COVID 19 since your procedure?  no   If yes to any of these questions please route to Laverna Peace, RN and Karlton Lemon, RN

## 2020-12-26 NOTE — Telephone Encounter (Signed)
  Follow up Call-  Call back number 12/22/2020  Post procedure Call Back phone  # [phone]  Permission to leave phone message Yes     1st follow up call made.  NALM

## 2021-01-04 ENCOUNTER — Ambulatory Visit: Payer: Medicare Other

## 2021-01-04 ENCOUNTER — Other Ambulatory Visit: Payer: Self-pay

## 2021-01-04 DIAGNOSIS — G4733 Obstructive sleep apnea (adult) (pediatric): Secondary | ICD-10-CM | POA: Diagnosis not present

## 2021-01-04 DIAGNOSIS — R29818 Other symptoms and signs involving the nervous system: Secondary | ICD-10-CM

## 2021-01-10 ENCOUNTER — Telehealth: Payer: Self-pay | Admitting: Pulmonary Disease

## 2021-01-10 DIAGNOSIS — G4733 Obstructive sleep apnea (adult) (pediatric): Secondary | ICD-10-CM

## 2021-01-10 NOTE — Telephone Encounter (Signed)
HST showed severe OSA with AHI 70/ hr  Suggest autoCPAP  5-20 cm, mask of choice & while waiting, schedule CPAP titration study given severity

## 2021-01-11 ENCOUNTER — Telehealth: Payer: Self-pay | Admitting: Internal Medicine

## 2021-01-11 ENCOUNTER — Encounter: Payer: Medicare Other | Admitting: Gastroenterology

## 2021-01-11 ENCOUNTER — Telehealth: Payer: Self-pay | Admitting: Gastroenterology

## 2021-01-11 ENCOUNTER — Encounter: Payer: Self-pay | Admitting: Neurology

## 2021-01-11 NOTE — Telephone Encounter (Signed)
Inbound call from patient. States for the last 24 hours each time she goes to restroom, fills the bowl with blood. Went about 6x with little bm and stomach is uncomfortable. Best contact [phone]

## 2021-01-11 NOTE — Telephone Encounter (Signed)
Sorry to hear this. I suspect he is likely having anorectal bleeding from fissure noted on his colonoscopy a few weeks ago. He is scheduled for anorectal manometry next week to further evaluate his chronic constipation. Recommend he take Miralax daily and titrate up as needed to make sure stool is soft and prevent constipation. Agree with Diltiazem ointment, he should be using that three times daily, pea sized amount PR TID. If this is not helping and symptoms persist over the next few days let me know. We can try switching to topical nitroglycerin if needed. Thanks

## 2021-01-11 NOTE — Telephone Encounter (Signed)
Spoke with patient in regards to recommendations. He will let us know if symptoms have not improved over the next few days. Patient verbalized understanding and had no concerns at the end of the call.

## 2021-01-11 NOTE — Telephone Encounter (Signed)
Spoke with patient, he reports that yesterday he had about 5-6 bowel movements and each time he noticed blood in the commode. He describes stools as "nuggets". He states that he is on several pain medications but takes Colace to keeps his bowels regular. He is using the diltiazem ointment. Denies any rectal burning or itching. He states that his stomach is uncomfortable and loud. He states that when he had a bowel movement this morning he noticed blood when wiping and in the commode but it was a lighter red and did not cover the entire commode this time. Patient denies any SOB, lightheadedness, or dizziness. Advised patient that he previously had a fissure and internal hemorrhoids noted on his colonoscopy. Advised patient that he may have developed another fissure and he will need to continue Diltiazem ointment. Please advise in regards to any other recommendations, thanks.

## 2021-01-11 NOTE — Telephone Encounter (Signed)
Patient called in asking about his referral. I looked in the notes and see patient was referred to NEUROLOGY, but a note stating Thank you for the referral! Unfortunately since this patient has been to [facility] in the past, they should be referred back to them for any further visits. Patient stated he has never been seen at [facility]. Please call patient and advise if a new referral needs to be sent and where he will be going for his Neurology visit will be.

## 2021-01-15 NOTE — Telephone Encounter (Signed)
Please let him know that his PSG shows significant OSA  I'd like to start him on auto-set CPAP, range 5-20cm h2O, if he is willing to start.

## 2021-01-15 NOTE — Telephone Encounter (Signed)
Called and spoke with patient to let him know about sleep study results. Advised him that Dr. Delton Coombes and Dr. Vassie Loll would like to order additional testing of cpap titration and that we are going to place order to get him set up with CPAP machine. Patient expressed understanding and would like to proceed. Orders have been placed. Nothing further needed at this time.

## 2021-01-16 ENCOUNTER — Telehealth: Payer: Self-pay | Admitting: Internal Medicine

## 2021-01-16 NOTE — Telephone Encounter (Signed)
Recent visit with me on 12/19/2020 related to back pain and cauda equina spinal cord injury. Referred to Neurology at that time.   Review of chart shows he was seen for history of the same on a previous visit with Marcy Siren, DO.   We will need to confirm with Coletta Memos, MD at Neurology the diagnosis related to ordering MRI of neck since their office is managing patient's care in regards to this concern. Also, confirm with their office that patient is calling primary care with instructions on behalf of Coletta Memos, MD.

## 2021-01-16 NOTE — Telephone Encounter (Signed)
Patient called stating he went and seen  Dr. Mikal Plane phone #[phone] and he told the patient to have his PCP order and MRI of the neck.

## 2021-01-17 ENCOUNTER — Encounter (HOSPITAL_COMMUNITY)
Admission: RE | Disposition: A | Discharge status: Home / Self Care | Payer: Self-pay | Source: Home / Self Care | Attending: Gastroenterology

## 2021-01-17 ENCOUNTER — Encounter (HOSPITAL_COMMUNITY): Payer: Self-pay | Admitting: Gastroenterology

## 2021-01-17 ENCOUNTER — Ambulatory Visit (HOSPITAL_COMMUNITY)
Admission: RE | Admit: 2021-01-17 | Discharge: 2021-01-17 | Disposition: A | Discharge status: Home / Self Care | Payer: Medicare Other | Attending: Gastroenterology | Admitting: Gastroenterology

## 2021-01-17 DIAGNOSIS — R150 Incomplete defecation: Secondary | ICD-10-CM | POA: Diagnosis present

## 2021-01-17 DIAGNOSIS — K6289 Other specified diseases of anus and rectum: Secondary | ICD-10-CM | POA: Diagnosis not present

## 2021-01-17 DIAGNOSIS — K629 Disease of anus and rectum, unspecified: Secondary | ICD-10-CM

## 2021-01-17 DIAGNOSIS — K5902 Outlet dysfunction constipation: Secondary | ICD-10-CM | POA: Diagnosis not present

## 2021-01-17 HISTORY — PX: ANAL RECTAL MANOMETRY: SHX6358

## 2021-01-17 SURGERY — MANOMETRY, ANORECTAL
Anesthesia: Choice

## 2021-01-17 NOTE — Progress Notes (Signed)
Anal manometry done per protocol. Pt tolerated well without distress or complication  Lawrence Cantu endo tech in room during procedure.

## 2021-01-19 NOTE — Telephone Encounter (Signed)
Patient called the office asking if there was any word on the MRI he had requested. I informed patient - We will need to confirm with Coletta Memos, MD at Neurology the diagnosis related to ordering MRI of neck since their office is managing patient's care in regards to this concern. Patient stated he has not seen Dr. Franky Macho, he works with the patients wife and she was explaining to him the situation with the patient. Dr Franky Macho suggested that an MRI to be done. Patient also stated that Marcy Siren DO was aware of his issues.

## 2021-01-20 NOTE — Telephone Encounter (Signed)
Please schedule an appointment. Patient welcome to virtual appointment or in-person office visit.

## 2021-01-22 NOTE — Telephone Encounter (Signed)
Called patient and LVM to have patient call the office so we can schedule him an appointment with Ricky Stabs NP regarding a referral for an MRI

## 2021-01-22 NOTE — Telephone Encounter (Signed)
Patient called back to schedule a virtual appointment. Informed patient the first available appointment I had was 02/23/21 at 2:10pm and patient said he can't wait that long. I informed patient that Amy is booked and he stated it should not take that long for the visit. I let the patient know I would ask the nurse to see if it was possible to get him in any sooner. Is there anyway the patient can be seen sooner for a virtual visit?

## 2021-01-23 ENCOUNTER — Other Ambulatory Visit: Payer: Self-pay

## 2021-01-23 ENCOUNTER — Telehealth (INDEPENDENT_AMBULATORY_CARE_PROVIDER_SITE_OTHER): Payer: Medicare Other | Admitting: Family

## 2021-01-23 DIAGNOSIS — G8929 Other chronic pain: Secondary | ICD-10-CM | POA: Diagnosis not present

## 2021-01-23 DIAGNOSIS — M542 Cervicalgia: Secondary | ICD-10-CM

## 2021-01-23 NOTE — Progress Notes (Signed)
Virtual Visit via Telephone Note  I connected with Lawrence Cantu, on 01/23/2021 at 4:41 PM by telephone due to the COVID-19 pandemic and verified that I am speaking with the correct person using two identifiers.  Due to current restrictions/limitations of in-office visits due to the COVID-19 pandemic, this scheduled clinical appointment was converted to a telehealth visit.   Consent: I discussed the limitations, risks, security and privacy concerns of performing an evaluation and management service by telephone and the availability of in person appointments. I also discussed with the patient that there may be a patient responsible charge related to this service. The patient expressed understanding and agreed to proceed.   Location of Patient: Home  Location of Provider: [facility] at [facility]   Persons participating in Telemedicine visit: Lawrence Cantu Lawrence Stabs, NP Margorie John, CMA   History of Present Illness: Lawrence Cantu is a 35 year old male who presents requesting MRI of neck related to chronic neck pain. Endorses limited mobility. Reports neurological issues ongoing for years. However, the neck pain is in the most recent months. Reports ankylosing spondylitis, fusion neck surgery, lumbar fusion, and cauda equina spinal cord injury. Endorses dropping things without realizing, numbness in hands. Consistent pain radiating to the arms and elbows. Requesting referral to [facility].   Past Medical History:  Diagnosis Date   Ankylosing spondylitis (HCC)    Anxiety    Arthritis    Phreesia 12/05/2019   BPH (benign prostatic hyperplasia)    Cauda equina syndrome (HCC)    Chronic pain    DDD (degenerative disc disease), cervical    DDD (degenerative disc disease), lumbar    Depression    Phreesia 12/05/2019   Enlarged prostate    Hyperlipidemia    Hypertension    pt denies   Neuromuscular disorder (HCC)    Phreesia 12/05/2019   Pneumonia    x  2 as a child, 1 x as an adult   Post laminectomy syndrome    Sarcoidosis    Spinal stenosis    Allergies  Allergen Reactions   Strawberry (Diagnostic) Anaphylaxis   Other Hives    Cat Dander    Current Outpatient Medications on File Prior to Visit  Medication Sig Dispense Refill   AMBULATORY NON FORMULARY MEDICATION Medication Name: Diltiazem/ lidocaine ointment. Apply a pea sized amount TID for anal fissure 30 g 0   escitalopram (LEXAPRO) 20 MG tablet Take 1 tablet (20 mg total) by mouth in the morning. Dx: Ata.Sales.A 90 tablet 0   folic acid (FOLVITE) 1 MG tablet TAKE 1 TABLET BY MOUTH EVERY DAY FOR 30 DAYS     gabapentin (NEURONTIN) 600 MG tablet Take 1,200 mg by mouth in the morning, at noon, and at bedtime.      inFLIXimab-abda (RENFLEXIS) 100 MG SOLR 5mg /kg     loratadine (CLARITIN) 10 MG tablet Take 10 mg by mouth at bedtime as needed for allergies.     Melatonin 10 MG TABS Take 10 mg by mouth at bedtime.     methotrexate (RHEUMATREX) 2.5 MG tablet Take 20 mg by mouth once a week.     Methotrexate 10 MG/0.4ML SOSY      morphine (MS CONTIN) 15 MG 12 hr tablet Take 15 mg by mouth 2 (two) times daily.      oxyCODONE (OXY IR/ROXICODONE) 5 MG immediate release tablet Take 5 mg by mouth in the morning, at noon, and at bedtime.      tamsulosin (FLOMAX)  0.4 MG CAPS capsule Take 1 capsule (0.4 mg total) by mouth at bedtime. 90 capsule 1   tiZANidine (ZANAFLEX) 2 MG tablet Take 1 tablet (2 mg total) by mouth 3 (three) times daily as needed for muscle spasms. 30 tablet 0   No current facility-administered medications on file prior to visit.    Observations/Objective: Alert and oriented x 3. Not in acute distress. Physical examination not completed as this is a telemedicine visit.  Assessment and Plan: 1. Chronic neck pain: - Per patient request MRI cervical spine for further evaluation.  - Per patient request referral to Neurosurgery [facility]) for further evaluation and  management.  - MR Cervical Spine Wo Contrast; Future - Ambulatory referral to Neurosurgery   Follow Up Instructions: Follow-up with primary provider as scheduled.    Patient was given clear instructions to go to Emergency Department or return to medical center if symptoms don't improve, worsen, or new problems develop.The patient verbalized understanding.  I discussed the assessment and treatment plan with the patient. The patient was provided an opportunity to ask questions and all were answered. The patient agreed with the plan and demonstrated an understanding of the instructions.   The patient was advised to call back or seek an in-person evaluation if the symptoms worsen or if the condition fails to improve as anticipated.   I provided 10 minutes total of non-face-to-face time during this encounter.   Rema Fendt, NP  [facility] at [facility] [place], [place] [phone] 01/23/2021, 4:41 PM

## 2021-01-23 NOTE — Progress Notes (Signed)
Pt presents for telemedicine visit requesting MRI due to chronic neck pain, limited mobility. Pt states have been have neurological issues for years the neck pain more recent months

## 2021-01-23 NOTE — Telephone Encounter (Signed)
Pt can be scheduled today if he is available

## 2021-01-25 DIAGNOSIS — K629 Disease of anus and rectum, unspecified: Secondary | ICD-10-CM

## 2021-01-25 DIAGNOSIS — K5902 Outlet dysfunction constipation: Secondary | ICD-10-CM

## 2021-01-30 ENCOUNTER — Telehealth: Payer: Self-pay | Admitting: Internal Medicine

## 2021-01-30 NOTE — Telephone Encounter (Signed)
Appt scheduled Tues 02/06/21@430 

## 2021-01-30 NOTE — Telephone Encounter (Signed)
Pt calling to inquire about the scheduling of an MRI Cervical Spine Wo Contrast.  Please advise and thank you

## 2021-02-05 ENCOUNTER — Ambulatory Visit (HOSPITAL_COMMUNITY)
Admission: RE | Admit: 2021-02-05 | Discharge: 2021-02-05 | Disposition: A | Discharge status: Home / Self Care | Payer: Medicare Other | Source: Ambulatory Visit | Attending: Family | Admitting: Family

## 2021-02-05 ENCOUNTER — Other Ambulatory Visit: Payer: Self-pay

## 2021-02-05 DIAGNOSIS — G8929 Other chronic pain: Secondary | ICD-10-CM

## 2021-02-05 DIAGNOSIS — M542 Cervicalgia: Secondary | ICD-10-CM | POA: Diagnosis present

## 2021-02-06 ENCOUNTER — Ambulatory Visit (HOSPITAL_COMMUNITY): Admission: RE | Admit: 2021-02-06 | Payer: Medicare Other | Source: Ambulatory Visit

## 2021-02-06 NOTE — Progress Notes (Signed)
Osteoarthritis cervical spine.   Keep appointment with Neurosurgery [facility]) for further evaluation and management.

## 2021-02-16 ENCOUNTER — Other Ambulatory Visit: Payer: Self-pay

## 2021-02-16 DIAGNOSIS — F32A Depression, unspecified: Secondary | ICD-10-CM

## 2021-02-16 MED ORDER — ESCITALOPRAM OXALATE 20 MG PO TABS: 20.0000 mg | ORAL_TABLET | Freq: Every morning | ORAL | 0 refills | Status: DC

## 2021-03-13 ENCOUNTER — Encounter: Payer: Self-pay | Admitting: Family

## 2021-03-13 ENCOUNTER — Other Ambulatory Visit: Payer: Self-pay

## 2021-03-13 ENCOUNTER — Ambulatory Visit (INDEPENDENT_AMBULATORY_CARE_PROVIDER_SITE_OTHER): Payer: Medicare Other | Admitting: Family

## 2021-03-13 ENCOUNTER — Ambulatory Visit (HOSPITAL_BASED_OUTPATIENT_CLINIC_OR_DEPARTMENT_OTHER)
Admission: RE | Admit: 2021-03-13 | Discharge: 2021-03-13 | Disposition: A | Discharge status: Home / Self Care | Payer: Medicare Other | Source: Ambulatory Visit | Attending: Family | Admitting: Family

## 2021-03-13 DIAGNOSIS — D860 Sarcoidosis of lung: Secondary | ICD-10-CM | POA: Diagnosis not present

## 2021-03-13 DIAGNOSIS — R059 Cough, unspecified: Secondary | ICD-10-CM

## 2021-03-13 DIAGNOSIS — R053 Chronic cough: Secondary | ICD-10-CM | POA: Insufficient documentation

## 2021-03-13 DIAGNOSIS — Z8616 Personal history of COVID-19: Secondary | ICD-10-CM

## 2021-03-13 MED ORDER — BENZONATATE 100 MG PO CAPS: 100.0000 mg | ORAL_CAPSULE | Freq: Three times a day (TID) | ORAL | 0 refills | Status: DC | PRN

## 2021-03-13 MED ORDER — DOXYCYCLINE HYCLATE 100 MG PO TABS: 100.0000 mg | ORAL_TABLET | Freq: Two times a day (BID) | ORAL | 0 refills | Status: AC

## 2021-03-13 NOTE — Progress Notes (Signed)
Virtual Visit via Telephone Note  I connected with Lawrence Cantu, on 03/13/2021 at 10:41 AM by telephone due to the COVID-19 pandemic and verified that I am speaking with the correct person using two identifiers.  Due to current restrictions/limitations of in-office visits due to the COVID-19 pandemic, this scheduled clinical appointment was converted to a telehealth visit.   Consent: I discussed the limitations, risks, security and privacy concerns of performing an evaluation and management service by telephone and the availability of in person appointments. I also discussed with the patient that there may be a patient responsible charge related to this service. The patient expressed understanding and agreed to proceed.   Location of Patient: Home  Location of Provider: [facility]   Persons participating in Telemedicine visit: Jheremy Chandley Ricky Stabs, NP Margorie John, CMA   History of Present Illness: Lawrence Cantu is a 35 year-old male who presents with shortness of breath and cough. Reports after attending a bachelor party 2 weeks ago returned home not feeling well. Took a home Covid test which resulted positive. Taking over-the-counter Dayquil and Nyquil without much relief. Symptoms include fever, hot and cold chills, sweating, non-productive cough, tiredness, intermittent chest pain, and shortness of breath. Reports shortness of breath is with activity and rest. Reports shortness of breath worse with laying down. Has sarcoidosis and taking Albuterol inhaler per Pulmonology recently started on CPAP machine about 1.5 months ago.    Past Medical History:  Diagnosis Date   Ankylosing spondylitis (HCC)    Anxiety    Arthritis    Phreesia 12/05/2019   BPH (benign prostatic hyperplasia)    Cauda equina syndrome (HCC)    Chronic pain    DDD (degenerative disc disease), cervical    DDD (degenerative disc disease), lumbar    Depression    Phreesia  12/05/2019   Enlarged prostate    Hyperlipidemia    Hypertension    pt denies   Neuromuscular disorder (HCC)    Phreesia 12/05/2019   Pneumonia    x 2 as a child, 1 x as an adult   Post laminectomy syndrome    Sarcoidosis    Spinal stenosis    Allergies  Allergen Reactions   Strawberry (Diagnostic) Anaphylaxis   Other Hives    Cat Dander    Current Outpatient Medications on File Prior to Visit  Medication Sig Dispense Refill   AMBULATORY NON FORMULARY MEDICATION Medication Name: Diltiazem/ lidocaine ointment. Apply a pea sized amount TID for anal fissure 30 g 0   escitalopram (LEXAPRO) 20 MG tablet Take 1 tablet (20 mg total) by mouth in the morning. Dx: Ata.Sales.A 90 tablet 0   folic acid (FOLVITE) 1 MG tablet TAKE 1 TABLET BY MOUTH EVERY DAY FOR 30 DAYS     gabapentin (NEURONTIN) 600 MG tablet Take 1,200 mg by mouth in the morning, at noon, and at bedtime.      inFLIXimab-abda (RENFLEXIS) 100 MG SOLR 5mg /kg     loratadine (CLARITIN) 10 MG tablet Take 10 mg by mouth at bedtime as needed for allergies.     Melatonin 10 MG TABS Take 10 mg by mouth at bedtime.     methotrexate (RHEUMATREX) 2.5 MG tablet Take 20 mg by mouth once a week.     Methotrexate 10 MG/0.4ML SOSY      morphine (MS CONTIN) 15 MG 12 hr tablet Take 15 mg by mouth 2 (two) times daily.      oxyCODONE (OXY IR/ROXICODONE)  5 MG immediate release tablet Take 5 mg by mouth in the morning, at noon, and at bedtime.      tamsulosin (FLOMAX) 0.4 MG CAPS capsule Take 1 capsule (0.4 mg total) by mouth at bedtime. 90 capsule 1   tiZANidine (ZANAFLEX) 2 MG tablet Take 1 tablet (2 mg total) by mouth 3 (three) times daily as needed for muscle spasms. 30 tablet 0   No current facility-administered medications on file prior to visit.    Observations/Objective: Alert and oriented x 3. Not in acute distress. Physical examination not completed as this is a telemedicine visit.  Assessment and Plan: 1. History of COVID-19: 2.  Sarcoidosis of lung (HCC): 3. Cough: - Patient reports tested positive 2 weeks ago with Covid home test.  - Continue over-the-counter Dayquil and Nyquil.  - Patient with sarcoidosis. Continue Albuterol as prescribed per Pulmonology. Counseled to contact Pulmonology on advisement of change in medication regimen. Patient agreeable.  - Begin Doxycycline and Benzonatate Perles as prescribed.  - Diagnostic chest x-ray for further evaluation.  - Follow-up with primary provider as scheduled.  - Patient was given clear instructions to go to Emergency Department or return to medical center if symptoms don't improve, worsen, or new problems develop.The patient verbalized understanding. - DG Chest 2 View; Future - doxycycline (VIBRA-TABS) 100 MG tablet; Take 1 tablet (100 mg total) by mouth 2 (two) times daily for 7 days.  Dispense: 14 tablet; Refill: 0 - benzonatate (TESSALON PERLES) 100 MG capsule; Take 1 capsule (100 mg total) by mouth 3 (three) times daily as needed for cough.  Dispense: 20 capsule; Refill: 0 - doxycycline (VIBRA-TABS) 100 MG tablet; Take 1 tablet (100 mg total) by mouth 2 (two) times daily for 7 days.  Dispense: 14 tablet; Refill: 0   Follow Up Instructions: Follow-up with primary provider as scheduled.    Patient was given clear instructions to go to Emergency Department or return to medical center if symptoms don't improve, worsen, or new problems develop.The patient verbalized understanding.  I discussed the assessment and treatment plan with the patient. The patient was provided an opportunity to ask questions and all were answered. The patient agreed with the plan and demonstrated an understanding of the instructions.   The patient was advised to call back or seek an in-person evaluation if the symptoms worsen or if the condition fails to improve as anticipated.    I provided 15 minutes total of non-face-to-face time during this encounter.   Rema Fendt, NP  [facility] at [facility] [place], [place] [phone] 03/13/2021, 10:41 AM

## 2021-03-13 NOTE — Progress Notes (Signed)
Pt presents for respiratory issues, SOB, dry cough, reports two weeks ago diagnosed with Covid

## 2021-03-14 ENCOUNTER — Other Ambulatory Visit: Payer: Self-pay | Admitting: Family

## 2021-03-14 DIAGNOSIS — Z8616 Personal history of COVID-19: Secondary | ICD-10-CM

## 2021-03-14 DIAGNOSIS — D860 Sarcoidosis of lung: Secondary | ICD-10-CM

## 2021-03-14 DIAGNOSIS — R059 Cough, unspecified: Secondary | ICD-10-CM

## 2021-03-14 MED ORDER — PREDNISONE 20 MG PO TABS: 40.0000 mg | ORAL_TABLET | Freq: Every day | ORAL | 0 refills | Status: AC

## 2021-03-14 NOTE — Progress Notes (Signed)
Prednisone prescribed for possible Covid pneumonia versus chronic asthma.   A CT chest ordered. The CMA will schedule appointment on behalf of patient.   Encouraged to contact Pulmonology today for follow-up.  Report to Emergency Department if symptoms don't improve, worsen, or new problems develop.

## 2021-03-23 ENCOUNTER — Other Ambulatory Visit: Payer: Self-pay

## 2021-03-23 ENCOUNTER — Ambulatory Visit (INDEPENDENT_AMBULATORY_CARE_PROVIDER_SITE_OTHER): Payer: Medicare Other | Admitting: Neurology

## 2021-03-23 ENCOUNTER — Encounter: Payer: Self-pay | Admitting: Neurology

## 2021-03-23 VITALS — BP 110/75 | HR 91 | Ht 70.0 in | Wt 223.0 lb

## 2021-03-23 DIAGNOSIS — R202 Paresthesia of skin: Secondary | ICD-10-CM | POA: Diagnosis not present

## 2021-03-23 DIAGNOSIS — S343XXS Injury of cauda equina, sequela: Secondary | ICD-10-CM | POA: Diagnosis not present

## 2021-03-23 NOTE — Patient Instructions (Addendum)
Nerve testing of the arms  MRI lumbar spine

## 2021-03-23 NOTE — Progress Notes (Signed)
[facility] Neurology Division Clinic Note - Initial Visit   Date: 03/23/21  Maks Cavallero MRN: [id number] DOB: 1985-08-22   Dear Dr. Earlene Plater:  Thank you for your kind referral of Ruairi Stutsman for consultation of bilateral arm tingling. Although his history is well known to you, please allow [place] to reiterate it for the purpose of our medical record. The patient was accompanied to the clinic by wife who also provides collateral information.     History of Present Illness: Paula Zietz is a 35 y.o. right-handed male with anklylosing spondylosis, s/p ACDF at C3-4, s/p lumbar decompression and fusion at L4-5, history of cauda equina syndrome, and chronic pain presenting for evaluation of bilateral hand tingling. Starting around 5 years ago, developed numbness/tingling in the hands, especially fingertips. It occurs daily and wakes him up from sleeping.  He complains of dropping items.  He had MRI cervical spine in August 2022 which showed mild cervical spondylosis causing mild impingement.  He also complains of dull constant pain over the triceps, neck pain, right biceps.    He is also wanting my opinion on his bowel problems of inability to evacuate and having to self disimpact.  He is seeing GI for this problem who recommended PT for pelvic floor exercises.  They are seeking my opinion and I informed them that this problem is beyond my realm of expertise and I would defer him to the management and guidance of GI.    Out-side paper records, electronic medical record, and images have been reviewed where available and summarized as:  NCS/EMG of the legs 02/23/2020: This is a normal study of the lower extremities.  In particular, there is no evidence of a sensorimotor polyneuropathy or lumbosacral radiculopathy.  MRI lumbar spine wwo contrast 12/09/2019: L1-L2:  No significant canal or foraminal stenosis.   L2-L3:  No significant canal or foraminal stenosis.   L3-L4: Mild facet arthropathy  ligamentum. Congenital narrowing of the canal. No significant degenerative stenosis.   L4-L5: Operative level. Canal has been decompressed. Ill-defined epidural soft tissue is likely postoperative. Small endplate osteophytes are present. There is facet arthropathy. No significant foraminal stenosis.   L5-S1: Operative level. Canal has been decompressed. There is prominent ventral epidural fat effacing the thecal sac. Mild facet arthropathy. No significant foraminal stenosis   IMPRESSION: Postoperative and degenerative changes as detailed above. No high-grade stenosis.  MRI cervical spine wo contrast 02/06/2021: 1. Mild cervical spondylosis and degenerative disc disease, causing mild impingement at C4-5 and C5-6 as detailed above. The mild central narrowing of the thecal sac at C5-6 is increased from 12/17/2019 and due to a small central disc protrusion. 2. 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     Past Medical History:  Diagnosis Date   Ankylosing spondylitis (HCC)    Anxiety    Arthritis    Phreesia 12/05/2019   BPH (benign prostatic hyperplasia)    Cauda equina syndrome (HCC)    Chronic pain    DDD (degenerative disc disease), cervical    DDD (degenerative disc disease), lumbar    Depression    Phreesia 12/05/2019   Enlarged prostate    Hyperlipidemia    Hypertension    pt denies   Neuromuscular disorder (HCC)    Phreesia 12/05/2019   Pneumonia    x 2 as a child, 1 x as an adult   Post laminectomy syndrome    Sarcoidosis    Spinal stenosis     Past Surgical History:  Procedure  Laterality Date   ANAL RECTAL MANOMETRY N/A 01/17/2021   Procedure: ANO RECTAL MANOMETRY;  Surgeon: Benancio Deeds, MD;  Location: [facility]OSCOPY;  Service: Gastroenterology;  Laterality: N/A;   BRONCHIAL WASHINGS  06/13/2020   Procedure: BRONCHIAL WASHINGS;  Surgeon: Leslye Peer, MD;  Location: [facility]OSCOPY;  Service: Pulmonary;;   CERVICAL FUSION     FINE NEEDLE  ASPIRATION  06/13/2020   Procedure: FINE NEEDLE ASPIRATION (FNA) LINEAR;  Surgeon: Leslye Peer, MD;  Location: [facility]OSCOPY;  Service: Pulmonary;;   IR FL GUIDED LOC OF NEEDLE/CATH TIP FOR SPINAL INJECTION LT     has had at least 10 of these   LAMINECTOMY AND MICRODISCECTOMY LUMBAR SPINE     SPINAL CORD STIMULATOR IMPLANT  ?2018   SPINAL FUSION     SPINE SURGERY N/A    Phreesia 12/05/2019   VIDEO BRONCHOSCOPY WITH ENDOBRONCHIAL ULTRASOUND Bilateral 06/13/2020   Procedure: VIDEO BRONCHOSCOPY WITH ENDOBRONCHIAL ULTRASOUND;  Surgeon: Leslye Peer, MD;  Location: [facility] ENDOSCOPY;  Service: Pulmonary;  Laterality: Bilateral;     Medications:  Outpatient Encounter Medications as of 03/23/2021  Medication Sig   AMBULATORY NON FORMULARY MEDICATION Medication Name: Diltiazem/ lidocaine ointment. Apply a pea sized amount TID for anal fissure   escitalopram (LEXAPRO) 20 MG tablet Take 1 tablet (20 mg total) by mouth in the morning. Dx: Ata.Sales.A   folic acid (FOLVITE) 1 MG tablet TAKE 1 TABLET BY MOUTH EVERY DAY FOR 30 DAYS   gabapentin (NEURONTIN) 600 MG tablet Take 600 mg by mouth 3 (three) times daily. Take two tablets 3 times daily   inFLIXimab-abda (RENFLEXIS) 100 MG SOLR Once every 6 weeks infusion   loratadine (CLARITIN) 10 MG tablet Take 10 mg by mouth at bedtime as needed for allergies.   Melatonin 10 MG TABS Take 10 mg by mouth at bedtime.   methotrexate (RHEUMATREX) 2.5 MG tablet Take 20 mg by mouth once a week.   morphine (MS CONTIN) 15 MG 12 hr tablet Take 15 mg by mouth 2 (two) times daily.    oxyCODONE (OXY IR/ROXICODONE) 5 MG immediate release tablet Take 5 mg by mouth in the morning, at noon, and at bedtime.    tamsulosin (FLOMAX) 0.4 MG CAPS capsule Take 1 capsule (0.4 mg total) by mouth at bedtime.   tiZANidine (ZANAFLEX) 2 MG tablet Take 1 tablet (2 mg total) by mouth 3 (three) times daily as needed for muscle spasms.   [DISCONTINUED] benzonatate (TESSALON PERLES) 100 MG capsule  Take 1 capsule (100 mg total) by mouth 3 (three) times daily as needed for cough. (Patient not taking: Reported on 03/23/2021)   [DISCONTINUED] Methotrexate 10 MG/0.4ML SOSY  (Patient not taking: Reported on 03/23/2021)   No facility-administered encounter medications on file as of 03/23/2021.    Allergies:  Allergies  Allergen Reactions   Strawberry (Diagnostic) Anaphylaxis   Other Hives    Cat Dander    Family History: Family History  Problem Relation Age of Onset   Depression Mother    Breast cancer Mother    Depression Father    Thyroid cancer Father    Depression Sister    Depression Brother    Hypertension Maternal Grandmother    Hyperlipidemia Maternal Grandmother    Heart disease Maternal Grandmother    Atrial fibrillation Maternal Grandmother    Diabetes Maternal Grandmother     Social History: Social History   Tobacco Use   Smoking status: Former    Packs/day: 0.50  Years: 5.00    Pack years: 2.50    Types: Cigarettes    Quit date: 04/20/2014    Years since quitting: 6.9   Smokeless tobacco: Never  Vaping Use   Vaping Use: Never used  Substance Use Topics   Alcohol use: Yes    Alcohol/week: 2.0 standard drinks    Types: 2 Standard drinks or equivalent per week    Comment: 1 beer a week   Drug use: Never   Social History   Social History Narrative   Right Handed    Lives in a two story home     Vital Signs:  BP 110/75   Pulse 91   Ht 5\' 10"  (1.778 m)   Wt 223 lb (101.2 kg)   SpO2 97%   BMI 32.00 kg/m     Neurological Exam: MENTAL STATUS including orientation to time, place, person, recent and remote memory, attention span and concentration, language, and fund of knowledge is normal.  Speech is not dysarthric.  CRANIAL NERVES: II:  No visual field defects.  Unremarkable fundi.   III-IV-VI: Pupils equal round and reactive to light.  Normal conjugate, extra-ocular eye movements in all directions of gaze.  No nystagmus.  No ptosis.   V:   Normal facial sensation.    VII:  Normal facial symmetry and movements.   VIII:  Normal hearing and vestibular function.   IX-X:  Normal palatal movement.   XI:  Normal shoulder shrug and head rotation.   XII:  Normal tongue strength and range of motion, no deviation or fasciculation.  MOTOR:  No atrophy, fasciculations or abnormal movements.  No pronator drift.   Upper Extremity:  Right  Left  Deltoid  5/5   5/5   Biceps  5/5   5/5   Triceps  5/5   5/5   Infraspinatus 5/5  5/5  Medial pectoralis 5/5  5/5  Wrist extensors  5/5   5/5   Wrist flexors  5/5   5/5   Finger extensors  5/5   5/5   Finger flexors  5/5   5/5   Dorsal interossei  5/5   5/5   Abductor pollicis  5/5   5/5   Tone (Ashworth scale)  0  0   Lower Extremity:  Right  Left  Hip flexors  5/5   5/5   Hip extensors  5/5   5/5   Adductor 5/5  5/5  Abductor 5/5  5/5  Knee flexors  5/5   5/5   Knee extensors  5/5   5/5   Dorsiflexors  5/5   5/5   Plantarflexors  5/5   5/5   Toe extensors  5/5   5/5   Toe flexors  5/5   5/5   Tone (Ashworth scale)  0  0   MSRs:  Right        Left                  brachioradialis 2+  2+  biceps 2+  2+  triceps 2+  2+  patellar 3+  3+  ankle jerk 2+  2+  Hoffman no  no  plantar response down  down   SENSORY:  Normal and symmetric perception of light touch, pinprick, vibration, and proprioception.  Romberg's sign absent.   COORDINATION/GAIT: Normal finger-to- nose-finger and heel-to-shin.  Intact rapid alternating movements bilaterally.  Gait assisted with a cane, stable.   IMPRESSION: Bilateral hand paresthesias, ?entrapment neuropathy such as  carpal tunnel syndrome.  MRI cervical spine personally viewed and does not show impingement which I would expect to cause his symptoms. Therefore, I recommend NCS/EMG of the arms to help characterize the nature of his symptoms.   History of cauda equina syndrome due to L4-5 disc herniation s/p decompression and fusion, now with  worsening stool retention.  Explained to patient that unlikely related to lumbar disease, especially with MRI from 2021 shows decompression spinal canal and no evidence of nerve root impingement.  Patient is requesting repeat study because of worsening symptoms and would like to have it available when he sees neurosurgery.    Further recommendations pending results.  Total time spent reviewing records, interview, history/exam, documentation, and coordination of care on day of encounter:  45 min    Thank you for allowing me to participate in patient's care.  If I can answer any additional questions, I would be pleased to do so.    Sincerely,    Lyn Deemer K. Allena Katz, DO

## 2021-03-27 ENCOUNTER — Ambulatory Visit (HOSPITAL_COMMUNITY): Admission: RE | Admit: 2021-03-27 | Payer: Medicare Other | Source: Ambulatory Visit

## 2021-04-01 ENCOUNTER — Ambulatory Visit (HOSPITAL_BASED_OUTPATIENT_CLINIC_OR_DEPARTMENT_OTHER): Payer: Medicare Other | Admitting: Pulmonary Disease

## 2021-04-04 ENCOUNTER — Other Ambulatory Visit: Payer: Self-pay

## 2021-04-04 DIAGNOSIS — S343XXS Injury of cauda equina, sequela: Secondary | ICD-10-CM

## 2021-04-04 MED ORDER — TAMSULOSIN HCL 0.4 MG PO CAPS
0.4000 mg | ORAL_CAPSULE | Freq: Every day | ORAL | 0 refills | Status: DC
Start: 2021-04-04 — End: 2021-06-25

## 2021-04-11 ENCOUNTER — Ambulatory Visit
Admission: RE | Admit: 2021-04-11 | Discharge: 2021-04-11 | Disposition: A | Discharge status: Home / Self Care | Payer: Medicare Other | Source: Ambulatory Visit | Attending: Neurology | Admitting: Neurology

## 2021-04-11 DIAGNOSIS — S343XXS Injury of cauda equina, sequela: Secondary | ICD-10-CM

## 2021-04-11 DIAGNOSIS — R202 Paresthesia of skin: Secondary | ICD-10-CM

## 2021-04-17 ENCOUNTER — Other Ambulatory Visit: Payer: Self-pay

## 2021-04-17 ENCOUNTER — Ambulatory Visit (INDEPENDENT_AMBULATORY_CARE_PROVIDER_SITE_OTHER): Payer: Medicare Other | Admitting: Neurology

## 2021-04-17 DIAGNOSIS — R202 Paresthesia of skin: Secondary | ICD-10-CM

## 2021-04-17 DIAGNOSIS — G5601 Carpal tunnel syndrome, right upper limb: Secondary | ICD-10-CM

## 2021-04-17 DIAGNOSIS — G5622 Lesion of ulnar nerve, left upper limb: Secondary | ICD-10-CM

## 2021-04-17 NOTE — Procedures (Signed)
[facility] Neurology  [address] [place], [address]  [place], [place] [address] Tel: [phone] Fax:  [phone] Test Date:  04/17/2021  Patient: Lawrence Cantu DOB: October 02, 1985 Physician: Nita Sickle, DO  Sex: Male Height: 5\' 10"  Ref Phys: , DO  ID#: Nita Sickle   Technician:    Patient Complaints: This is a 35 year old man referred for evaluation of bilateral upper extremity paresthesias.  NCV & EMG Findings: Extensive electrodiagnostic testing of the right upper extremity and additional studies of the left shows:  Right median sensory response shows prolonged latency.  Left mixed palmar, bilateral median, and bilateral ulnar sensory responses are within normal limits. Left median motor response shows mildly slowed conduction velocity across the elbow (A Elbow-B Elbow, 48 m/s).  Bilateral median and right ulnar motor responses are within normal limits.   There is no evidence of active or chronic motor axonal loss changes affecting any of the tested muscles.  Motor unit configuration and recruitment pattern is within normal limits.    Impression: Right median neuropathy at or distal to the wrist (very mild), consistent with a clinical diagnosis of carpal tunnel syndrome.   Left ulnar neuropathy with slowing across the elbow, purely demyelinating, very mild.   ___________________________ 31, DO    Nerve Conduction Studies Anti Sensory Summary Table   Stim Site NR Peak (ms) Norm Peak (ms) P-T Amp (V) Norm P-T Amp  Left Median Anti Sensory (2nd Digit)  34C  Wrist    2.8 <3.4 50.9 >20  Right Median Anti Sensory (2nd Digit)  34C  Wrist    3.0 <3.4 36.3 >20  Left Ulnar Anti Sensory (5th Digit)  34C  Wrist    2.4 <3.1 41.4 >12  Right Ulnar Anti Sensory (5th Digit)  34C  Wrist    2.4 <3.1 36.8 >12   Motor Summary Table   Stim Site NR Onset (ms) Norm Onset (ms) O-P Amp (mV) Norm O-P Amp Site1 Site2 Delta-0 (ms) Dist (cm) Vel (m/s) Norm Vel (m/s)  Left  Median Motor (Abd Poll Brev)  34C  Wrist    2.8 <3.9 11.3 >6 Elbow Wrist 4.9 29.0 59 >50  Elbow    7.7  10.9         Right Median Motor (Abd Poll Brev)  34C  Wrist    2.8 <3.9 10.7 >6 Elbow Wrist 4.9 29.0 59 >50  Elbow    7.7  9.6         Left Ulnar Motor (Abd Dig Minimi)  34C  Wrist    2.0 <3.1 8.5 >7 B Elbow Wrist 4.0 24.0 60 >50  B Elbow    6.0  8.0  A Elbow B Elbow 2.1 10.0 48 >50  A Elbow    8.1  7.3         Right Ulnar Motor (Abd Dig Minimi)  34C  Wrist    2.4 <3.1 12.3 >7 B Elbow Wrist 3.9 24.0 62 >50  B Elbow    6.3  11.2  A Elbow B Elbow 1.6 10.0 62 >50  A Elbow    7.9  10.9          Comparison Summary Table   Stim Site NR Peak (ms) Norm Peak (ms) P-T Amp (V) Site1 Site2 Delta-P (ms) Norm Delta (ms)  Left Median/Ulnar Palm Comparison (Wrist - 8cm)  34C  Median Palm    1.6 <2.2 77.1 Median Palm Ulnar Palm 0.1   Ulnar Palm    1.5 <  2.2 20.5      Right Median/Ulnar Palm Comparison (Wrist - 8cm)  34C  Median Palm    1.8 <2.2 86.7 Median Palm Ulnar Palm 0.5   Ulnar Palm    1.3 <2.2 44.6       EMG   Side Muscle Ins Act Fibs Psw Fasc Number Recrt Dur Dur. Amp Amp. Poly Poly. Comment  Right 1stDorInt Nml Nml Nml Nml Nml Nml Nml Nml Nml Nml Nml Nml N/A  Right Abd Poll Brev Nml Nml Nml Nml Nml Nml Nml Nml Nml Nml Nml Nml N/A  Right PronatorTeres Nml Nml Nml Nml Nml Nml Nml Nml Nml Nml Nml Nml N/A  Right Biceps Nml Nml Nml Nml Nml Nml Nml Nml Nml Nml Nml Nml N/A  Right Triceps Nml Nml Nml Nml Nml Nml Nml Nml Nml Nml Nml Nml N/A  Right Deltoid Nml Nml Nml Nml Nml Nml Nml Nml Nml Nml Nml Nml N/A  Left 1stDorInt Nml Nml Nml Nml Nml Nml Nml Nml Nml Nml Nml Nml N/A  Left PronatorTeres Nml Nml Nml Nml Nml Nml Nml Nml Nml Nml Nml Nml N/A  Left Biceps Nml Nml Nml Nml Nml Nml Nml Nml Nml Nml Nml Nml N/A  Left Triceps Nml Nml Nml Nml Nml Nml Nml Nml Nml Nml Nml Nml N/A  Left Deltoid Nml Nml Nml Nml Nml Nml Nml Nml Nml Nml Nml Nml N/A  Left FlexCarpiUln Nml Nml Nml Nml Nml Nml Nml Nml  Nml Nml Nml Nml N/A      Waveforms:

## 2021-04-23 IMAGING — CT CT CHEST W/O CM
2 of 4 series · 15 of 36 positions shown, 18 images · non-contrast
Comparison: Chest CT 03/30/2020

CLINICAL DATA: Short of breath. Follow-up lung nodule. Small lymph
nodes. Ankylosing spondylitis

EXAM:
CT CHEST WITHOUT CONTRAST
TECHNIQUE: Multidetector CT imaging of the chest was performed following the
standard protocol without IV contrast.

[Series 2: thorax · axial · 0.95mm/px · z∈[+1210,+1516]mm · 12 of 179 slices shown, 15 images]
[im 13/179  mediastinal]
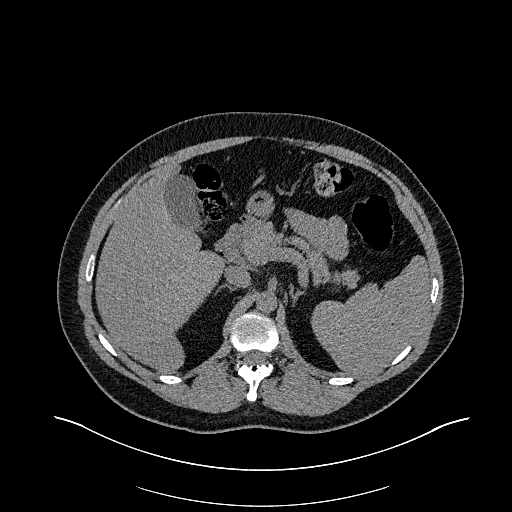
[im 13/179  lung]
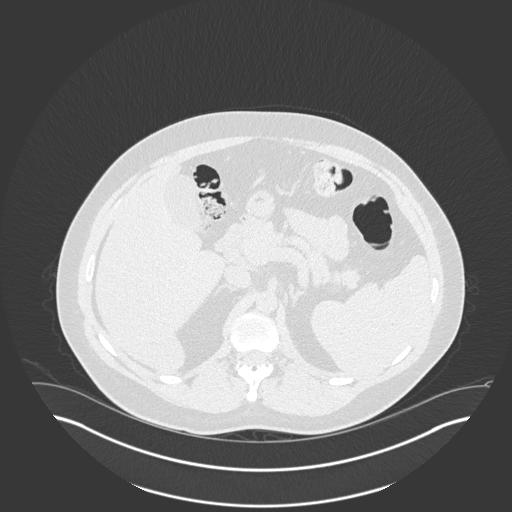
[im 26/179  lung]
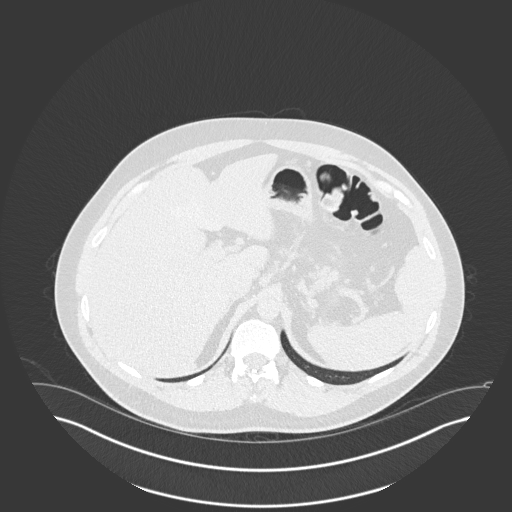
[im 39/179  lung]
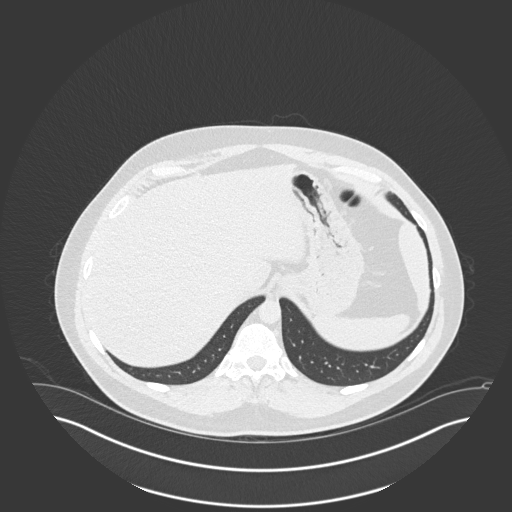
[im 51/179  lung]
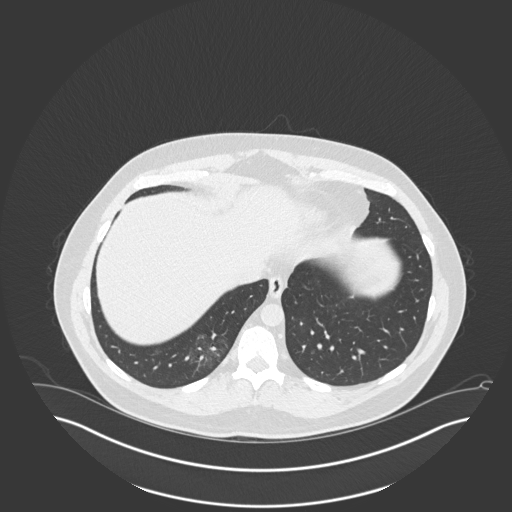
[im 64/179  mediastinal]
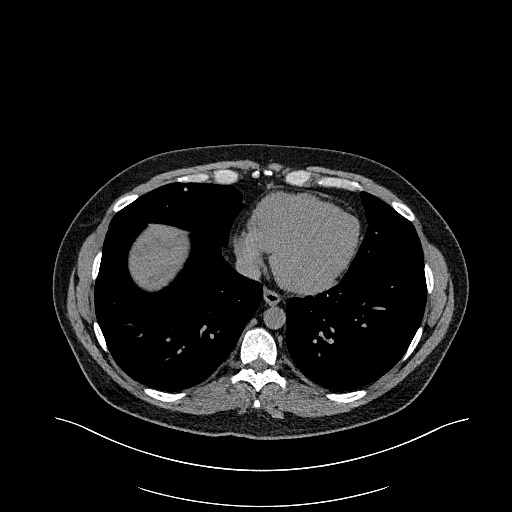
[im 64/179  lung]
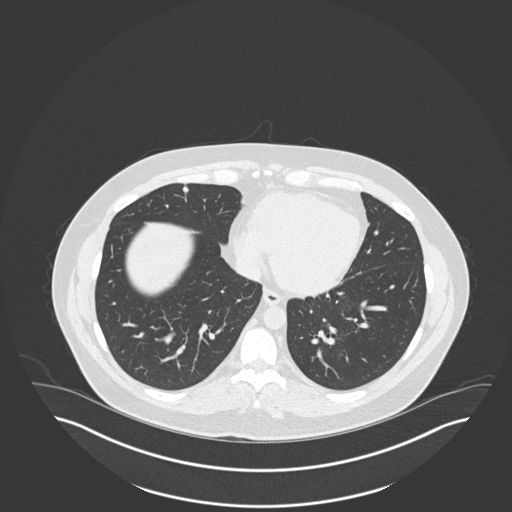
[im 77/179  lung]
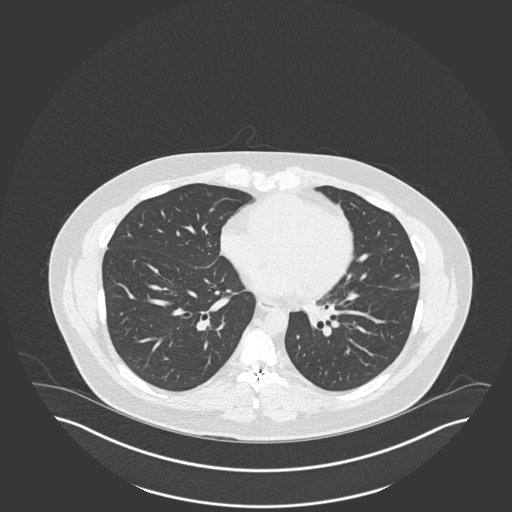
[im 102/179  lung]
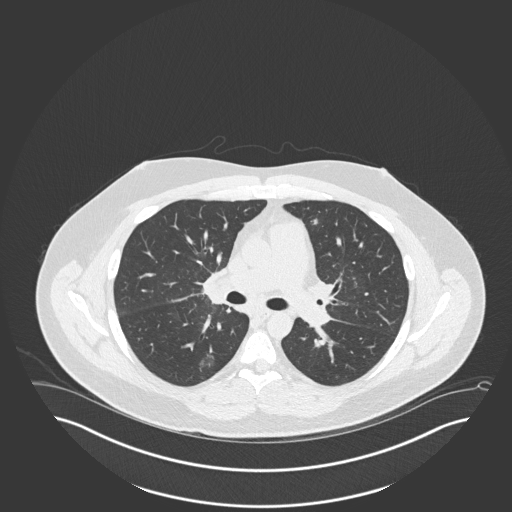
[im 115/179  lung]
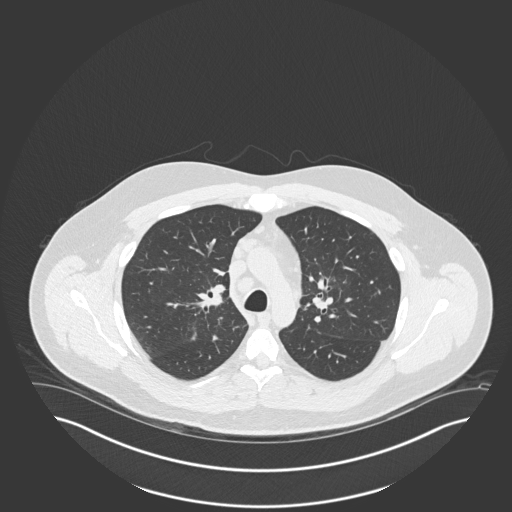
[im 128/179  mediastinal]
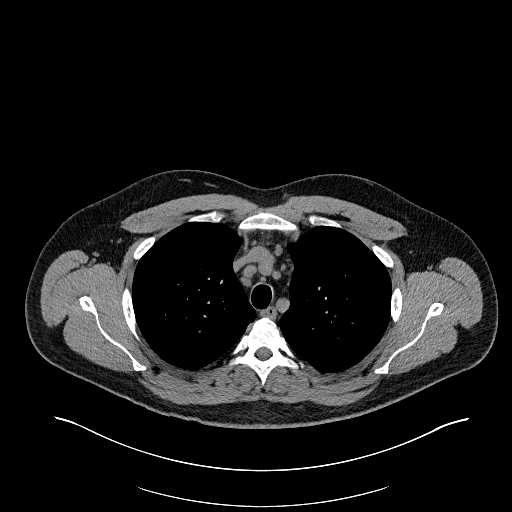
[im 128/179  lung]
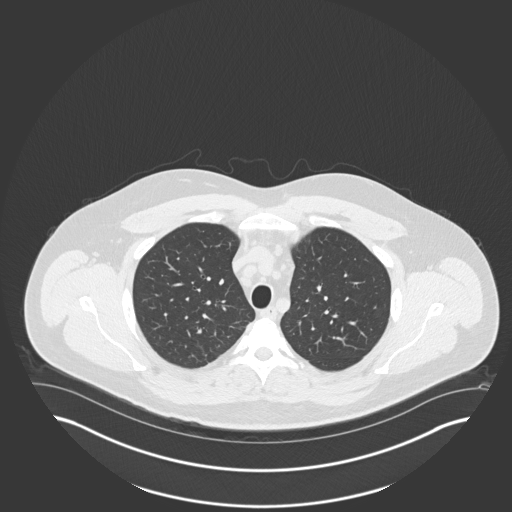
[im 140/179  lung]
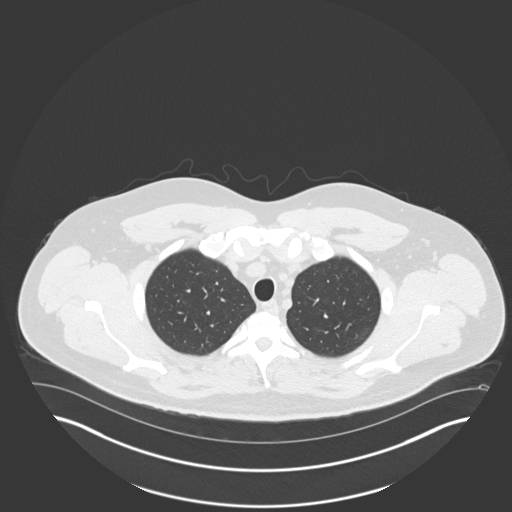
[im 153/179  lung]
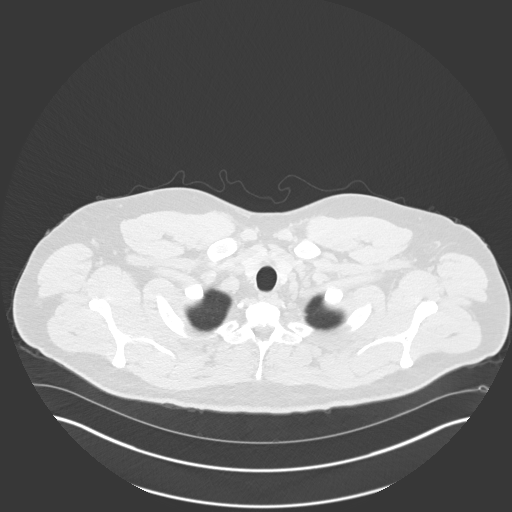
[im 166/179  lung]
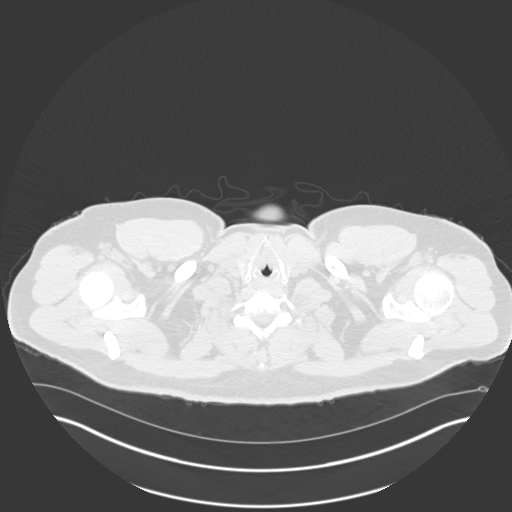

[Series 6: coronal · coronal · 0.74mm/px · 3 of 169 slices shown]
[im 34/169  lung]
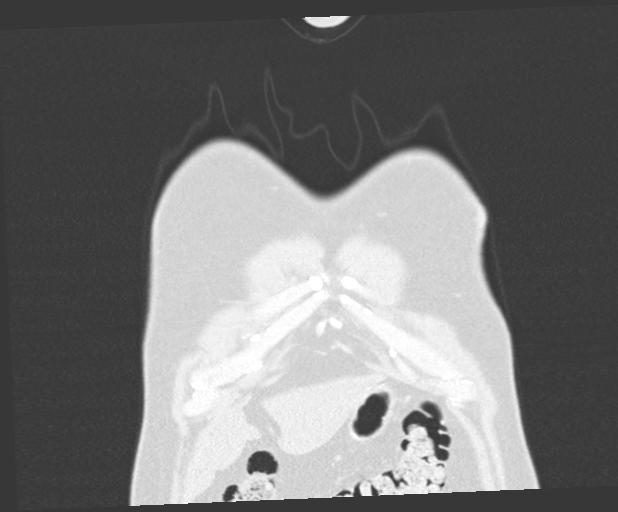
[im 68/169  lung]
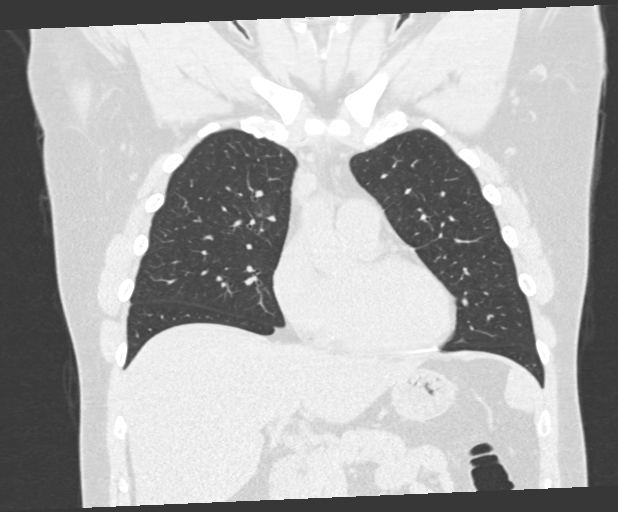
[im 101/169  lung]
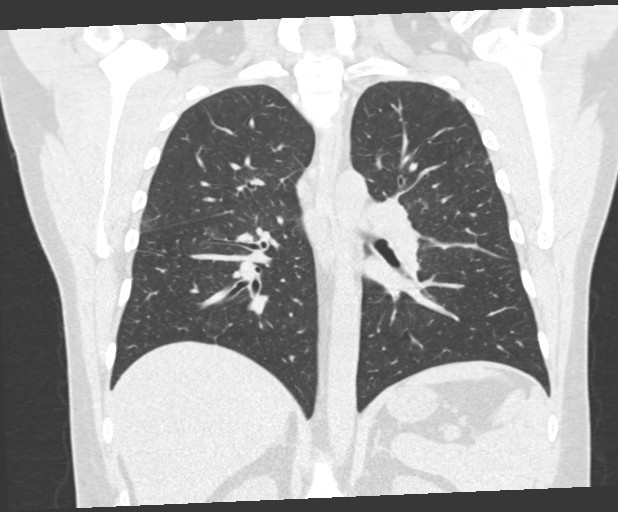

[15 of 36 positions shown; findings below may reference images not displayed]

FINDINGS: Cardiovascular: No significant vascular findings. Normal heart size.
No pericardial effusion.

Mediastinum/Nodes: Multiple mildly enlarged mediastinal lymph nodes
again demonstrated. Largest node is a RIGHT lower paratracheal node
measuring 15 mm short axis compared to 15 mm. Smaller prevascular
node measures 8 mm compared with 8 mm. No new mediastinal
adenopathy.

Hilar adenopathy appears mildly improved. Difficult to define hilar
nodes on noncontrast exam however the appear less prominent.

Lungs/Pleura: Within the LEFT upper lobe subpleural nodule measuring
7 mm (image 44/5) is unchanged from 6 mm on prior remeasured.

Two nodules along the LEFT oblique fissure measuring 5 mm (image
75/5) are unchanged.

Several ill-defined regions of ground-glass density in the RIGHT
lower lobe.

Upper Abdomen: Limited view of the liver, kidneys, pancreas are
unremarkable. Normal adrenal glands.

Musculoskeletal: Spinal cord stimulation device noted in the central
canal in the midthoracic spine. No osseous abnormalities otherwise.
IMPRESSION: 1. Stable mediastinal lymphadenopathy.
2. Hilar adenopathy may be slightly improved. Difficult define on
noncontrast exam.
3. Stable small LEFT lung pleural based pulmonary nodules.
Combination of pleural nodules and mediastinum adenopathy suggest
granulomatous disease such as sarcoidosis.
4. Several ground-glass nodules in the RIGHT lower lobe are
nonspecific.

## 2021-05-08 ENCOUNTER — Other Ambulatory Visit: Payer: Self-pay | Admitting: Family

## 2021-05-08 DIAGNOSIS — F32A Depression, unspecified: Secondary | ICD-10-CM

## 2021-05-11 ENCOUNTER — Other Ambulatory Visit: Payer: Self-pay | Admitting: Family

## 2021-05-11 DIAGNOSIS — F32A Depression, unspecified: Secondary | ICD-10-CM

## 2021-05-16 ENCOUNTER — Ambulatory Visit (INDEPENDENT_AMBULATORY_CARE_PROVIDER_SITE_OTHER): Payer: Medicare Other | Admitting: Nurse Practitioner

## 2021-05-16 ENCOUNTER — Other Ambulatory Visit: Payer: Self-pay

## 2021-05-16 ENCOUNTER — Encounter: Payer: Self-pay | Admitting: Nurse Practitioner

## 2021-05-16 VITALS — BP 115/77 | HR 110 | Resp 18 | Ht 70.0 in | Wt 208.8 lb

## 2021-05-16 DIAGNOSIS — R634 Abnormal weight loss: Secondary | ICD-10-CM

## 2021-05-16 DIAGNOSIS — F32A Depression, unspecified: Secondary | ICD-10-CM

## 2021-05-16 DIAGNOSIS — Z131 Encounter for screening for diabetes mellitus: Secondary | ICD-10-CM | POA: Diagnosis not present

## 2021-05-16 DIAGNOSIS — D869 Sarcoidosis, unspecified: Secondary | ICD-10-CM

## 2021-05-16 DIAGNOSIS — Z79899 Other long term (current) drug therapy: Secondary | ICD-10-CM

## 2021-05-16 MED ORDER — ESCITALOPRAM OXALATE 20 MG PO TABS: 20.0000 mg | ORAL_TABLET | Freq: Every morning | ORAL | 0 refills | Status: DC

## 2021-05-16 NOTE — Progress Notes (Signed)
@Patient  ID: , male    DOB: [birth year], 35 y.o.   MRN: [id number]  Chief Complaint  Patient presents with   Follow-up    Depression and weight loss    Referring provider: [phone]*  HPI  Patient presents today for follow-up on depression and anxiety.  He states that his current dose of Lexapro is working well for him.  He does need refill on this today.  Patient does have concerns today he states that he has lost 20 pounds in the past couple mont actually lost 15 pounds over the past few months.  We discussed that we can check labs today.  This will include thyroid panel. Denies f/c/s, n/v/d, hemoptysis, PND, chest pain or edema.      Allergies  Allergen Reactions   Strawberry (Diagnostic) Anaphylaxis   Other Hives    Cat Dander    Immunization History  Administered Date(s) Administered   Influenza Whole 05/15/2020   Influenza,inj,Quad PF,6+ Mos 08/12/2014   PFIZER(Purple Top)SARS-COV-2 Vaccination 11/04/2019, 11/29/2019, 12/08/2020    Past Medical History:  Diagnosis Date   Ankylosing spondylitis (HCC)    Anxiety    Arthritis    Phreesia 12/05/2019   BPH (benign prostatic hyperplasia)    Cauda equina syndrome (HCC)    Chronic pain    DDD (degenerative disc disease), cervical    DDD (degenerative disc disease), lumbar    Depression    Phreesia 12/05/2019   Enlarged prostate    Hyperlipidemia    Hypertension    pt denies   Neuromuscular disorder (HCC)    Phreesia 12/05/2019   Pneumonia    x 2 as a child, 1 x as an adult   Post laminectomy syndrome    Sarcoidosis    Spinal stenosis     Tobacco History: Social History   Tobacco Use  Smoking Status Former   Packs/day: 0.50   Years: 5.00   Pack years: 2.50   Types: Cigarettes   Quit date: 04/20/2014   Years since quitting: 7.0  Smokeless Tobacco Never   Counseling given: Yes   Outpatient Encounter Medications as of 05/16/2021  Medication Sig   AMBULATORY NON FORMULARY  MEDICATION Medication Name: Diltiazem/ lidocaine ointment. Apply a pea sized amount TID for anal fissure   escitalopram (LEXAPRO) 20 MG tablet Take 1 tablet (20 mg total) by mouth in the morning. Dx: 13/03/2021.A   folic acid (FOLVITE) 1 MG tablet TAKE 1 TABLET BY MOUTH EVERY DAY FOR 30 DAYS   gabapentin (NEURONTIN) 600 MG tablet Take 600 mg by mouth 3 (three) times daily. Take two tablets 3 times daily   inFLIXimab-abda (RENFLEXIS) 100 MG SOLR Once every 6 weeks infusion   loratadine (CLARITIN) 10 MG tablet Take 10 mg by mouth at bedtime as needed for allergies.   Melatonin 10 MG TABS Take 10 mg by mouth at bedtime.   methotrexate (RHEUMATREX) 2.5 MG tablet Take 20 mg by mouth once a week.   morphine (MS CONTIN) 15 MG 12 hr tablet Take 15 mg by mouth 2 (two) times daily.    oxyCODONE (OXY IR/ROXICODONE) 5 MG immediate release tablet Take 5 mg by mouth in the morning, at noon, and at bedtime.    tamsulosin (FLOMAX) 0.4 MG CAPS capsule Take 1 capsule (0.4 mg total) by mouth at bedtime.   tiZANidine (ZANAFLEX) 2 MG tablet Take 1 tablet (2 mg total) by mouth 3 (three) times daily as needed for muscle spasms.   [DISCONTINUED] escitalopram (LEXAPRO) 20  MG tablet Take 1 tablet (20 mg total) by mouth in the morning. Dx: Ata.Sales.A   No facility-administered encounter medications on file as of 05/16/2021.     Review of Systems  Review of Systems  Constitutional:  Positive for unexpected weight change. Negative for appetite change.  HENT: Negative.    Cardiovascular: Negative.   Gastrointestinal: Negative.   Allergic/Immunologic: Negative.   Neurological: Negative.   Psychiatric/Behavioral: Negative.        Physical Exam  BP 115/77   Pulse (!) 110   Resp 18   Ht 5\' 10"  (1.778 m)   Wt 208 lb 12.8 oz (94.7 kg)   SpO2 96%   BMI 29.96 kg/m   Wt Readings from Last 5 Encounters:  05/16/21 208 lb 12.8 oz (94.7 kg)  03/23/21 223 lb (101.2 kg)  12/22/20 229 lb (103.9 kg)  12/19/20 228 lb 6.4 oz  (103.6 kg)  12/11/20 229 lb (103.9 kg)     Physical Exam Vitals and nursing note reviewed.  Constitutional:      General: He is not in acute distress.    Appearance: He is well-developed.  Cardiovascular:     Rate and Rhythm: Normal rate and regular rhythm.  Pulmonary:     Effort: Pulmonary effort is normal.     Breath sounds: Normal breath sounds.  Skin:    General: Skin is warm and dry.  Neurological:     Mental Status: He is alert and oriented to person, place, and time.      Assessment & Plan:   Depression Will refill lexapro  Weight loss:  Will check labs  Follow up:  Follow up in 3-4 weeks for weight check with Dr. 02/10/21, NP 05/17/2021

## 2021-05-16 NOTE — Patient Instructions (Addendum)
Depression:  Will refill lexapro  Weight loss:  Will check labs  Follow up:  Follow up in 3-4 weeks for weight check with Dr. Andrey Campanile

## 2021-05-17 ENCOUNTER — Encounter: Payer: Self-pay | Admitting: Nurse Practitioner

## 2021-05-17 ENCOUNTER — Other Ambulatory Visit: Payer: Self-pay | Admitting: Nurse Practitioner

## 2021-05-17 DIAGNOSIS — Z808 Family history of malignant neoplasm of other organs or systems: Secondary | ICD-10-CM

## 2021-05-17 DIAGNOSIS — R7989 Other specified abnormal findings of blood chemistry: Secondary | ICD-10-CM

## 2021-05-17 DIAGNOSIS — F32A Depression, unspecified: Secondary | ICD-10-CM | POA: Insufficient documentation

## 2021-05-17 LAB — CBC
Hematocrit: 42.3 % (ref 37.5–51.0)
Hemoglobin: 14.4 g/dL (ref 13.0–17.7)
MCH: 29.4 pg (ref 26.6–33.0)
MCHC: 34 g/dL (ref 31.5–35.7)
MCV: 87 fL (ref 79–97)
Platelets: 258 10*3/uL (ref 150–450)
RBC: 4.89 x10E6/uL (ref 4.14–5.80)
RDW: 13.7 % (ref 11.6–15.4)
WBC: 5.9 10*3/uL (ref 3.4–10.8)

## 2021-05-17 LAB — COMPREHENSIVE METABOLIC PANEL
ALT: 22 IU/L (ref 0–44)
AST: 19 IU/L (ref 0–40)
Albumin/Globulin Ratio: 1.9 (ref 1.2–2.2)
Albumin: 4.3 g/dL (ref 4.0–5.0)
Alkaline Phosphatase: 49 IU/L (ref 44–121)
BUN/Creatinine Ratio: 21 — ABNORMAL HIGH (ref 9–20)
BUN: 14 mg/dL (ref 6–20)
Bilirubin Total: 0.6 mg/dL (ref 0.0–1.2)
CO2: 22 mmol/L (ref 20–29)
Calcium: 9.5 mg/dL (ref 8.7–10.2)
Chloride: 104 mmol/L (ref 96–106)
Creatinine, Ser: 0.66 mg/dL — ABNORMAL LOW (ref 0.76–1.27)
Globulin, Total: 2.3 g/dL (ref 1.5–4.5)
Glucose: 101 mg/dL — ABNORMAL HIGH (ref 70–99)
Potassium: 4.3 mmol/L (ref 3.5–5.2)
Sodium: 138 mmol/L (ref 134–144)
Total Protein: 6.6 g/dL (ref 6.0–8.5)
eGFR: 125 mL/min/{1.73_m2} (ref >59)

## 2021-05-17 LAB — THYROID PANEL WITH TSH
Free Thyroxine Index: 4.3 (ref 1.2–4.9)
T3 Uptake Ratio: 38 % (ref 24–39)
T4, Total: 11.3 ug/dL (ref 4.5–12.0)
TSH: <0.005 u[IU]/mL — ABNORMAL LOW (ref 0.450–4.500)

## 2021-05-17 NOTE — Assessment & Plan Note (Signed)
Will refill lexapro  Weight loss:  Will check labs  Follow up:  Follow up in 3-4 weeks for weight check with Dr. Andrey Campanile

## 2021-05-23 ENCOUNTER — Ambulatory Visit
Admission: RE | Admit: 2021-05-23 | Discharge: 2021-05-23 | Disposition: A | Discharge status: Home / Self Care | Payer: Medicare Other | Source: Ambulatory Visit | Attending: Emergency Medicine | Admitting: Emergency Medicine

## 2021-05-23 DIAGNOSIS — D869 Sarcoidosis, unspecified: Secondary | ICD-10-CM

## 2021-05-24 ENCOUNTER — Other Ambulatory Visit: Payer: Self-pay

## 2021-05-24 ENCOUNTER — Ambulatory Visit (HOSPITAL_BASED_OUTPATIENT_CLINIC_OR_DEPARTMENT_OTHER): Payer: Medicare Other | Attending: Emergency Medicine | Admitting: Pulmonary Disease

## 2021-05-24 DIAGNOSIS — G4733 Obstructive sleep apnea (adult) (pediatric): Secondary | ICD-10-CM | POA: Insufficient documentation

## 2021-05-25 DIAGNOSIS — G4733 Obstructive sleep apnea (adult) (pediatric): Secondary | ICD-10-CM | POA: Diagnosis not present

## 2021-05-25 NOTE — Procedures (Signed)
     Patient Name: Lawrence Cantu, Lawrence Cantu Date: 05/24/2021 Gender: Male D.O.B: 10-Feb-1986 Age (years): 35 Referring Provider: Levy Pupa Height (inches): 70 Interpreting Physician: Coralyn Helling MD, ABSM Weight (lbs): 205 RPSGT: Armen Pickup BMI: 29 MRN: [id number] Neck Size: 16.00  CLINICAL INFORMATION The patient is referred for a CPAP titration to treat sleep apnea.  Date of HST 01/04/21: AHI 69.9, SpO2 low 80%.  SLEEP STUDY TECHNIQUE As per the AASM Manual for the Scoring of Sleep and Associated Events v2.3 (April 2016) with a hypopnea requiring 4% desaturations.  The channels recorded and monitored were frontal, central and occipital EEG, electrooculogram (EOG), submentalis EMG (chin), nasal and oral airflow, thoracic and abdominal wall motion, anterior tibialis EMG, snore microphone, electrocardiogram, and pulse oximetry. Continuous positive airway pressure (CPAP) was initiated at the beginning of the study and titrated to treat sleep-disordered breathing.  MEDICATIONS Medications self-administered by patient taken the night of the study : NEURONTIN, MELATONIN, METHOTREXATE, MORPHINE, FLOMAX, TIZANIDINE  TECHNICIAN COMMENTS Comments added by technician: one restroom visted Comments added by scorer: N/A  RESPIRATORY PARAMETERS Optimal PAP Pressure (cm): 10 AHI at Optimal Pressure (/hr): 0 Overall Minimal O2 (%): 87.0 Supine % at Optimal Pressure (%): 100 Minimal O2 at Optimal Pressure (%): 92.0   SLEEP ARCHITECTURE The study was initiated at 10:21:54 PM and ended at 4:22:02 AM.  Sleep onset time was 3.2 minutes and the sleep efficiency was 86.5%%. The total sleep time was 311.4 minutes.  The patient spent 2.9%% of the night in stage N1 sleep, 51.5%% in stage N2 sleep, 35.0%% in stage N3 and 10.6% in REM.Stage REM latency was 217.5 minutes  Wake after sleep onset was 45.5. Alpha intrusion was absent. Supine sleep was 100.00%.  CARDIAC DATA The 2 lead EKG  demonstrated sinus rhythm. The mean heart rate was 85.8 beats per minute. Other EKG findings include: None.  LEG MOVEMENT DATA The total Periodic Limb Movements of Sleep (PLMS) were 0. The PLMS index was 0.0. A PLMS index of <15 is considered normal in adults.  IMPRESSIONS - He did well with CPAP 10 cm H2O. - He did not require supplemental oxygen during this study.  DIAGNOSIS - Obstructive Sleep Apnea (G47.33)  RECOMMENDATIONS - Trial of CPAP therapy on 10 cm H2O with a Medium size Resmed Nasal Pillow Mask AirFit P10 mask and heated humidification. - Avoid alcohol, sedatives and other CNS depressants that may worsen sleep apnea and disrupt normal sleep architecture. - Sleep hygiene should be reviewed to assess factors that may improve sleep quality. - Weight management and regular exercise should be initiated or continued.  [Electronically signed] 05/25/2021 08:27 AM  Coralyn Helling MD, ABSM Diplomate, American Board of Sleep Medicine   NPI: [id number] [facility] PH: [phone]   FX: [phone] ACCREDITED BY THE AMERICAN ACADEMY OF SLEEP MEDICINE

## 2021-05-28 ENCOUNTER — Other Ambulatory Visit: Payer: Self-pay | Admitting: Nurse Practitioner

## 2021-05-28 ENCOUNTER — Telehealth: Payer: Self-pay | Admitting: *Deleted

## 2021-05-28 DIAGNOSIS — R7989 Other specified abnormal findings of blood chemistry: Secondary | ICD-10-CM

## 2021-05-28 NOTE — Telephone Encounter (Signed)
Patient verified DOB Patient is aware of [facility] reviewing his referral and contacting him with appointment once completed. Patient is aware of [place] being scheduled for 12/1 @2 :30pm.

## 2021-06-07 ENCOUNTER — Ambulatory Visit (HOSPITAL_COMMUNITY)
Admission: RE | Admit: 2021-06-07 | Discharge: 2021-06-07 | Disposition: A | Discharge status: Home / Self Care | Payer: Medicare Other | Source: Ambulatory Visit | Attending: Nurse Practitioner | Admitting: Nurse Practitioner

## 2021-06-07 ENCOUNTER — Other Ambulatory Visit: Payer: Self-pay

## 2021-06-07 ENCOUNTER — Ambulatory Visit (HOSPITAL_COMMUNITY): Payer: Medicare Other

## 2021-06-07 DIAGNOSIS — R7989 Other specified abnormal findings of blood chemistry: Secondary | ICD-10-CM | POA: Insufficient documentation

## 2021-06-15 ENCOUNTER — Other Ambulatory Visit: Payer: Self-pay

## 2021-06-15 ENCOUNTER — Ambulatory Visit (INDEPENDENT_AMBULATORY_CARE_PROVIDER_SITE_OTHER): Payer: Medicare Other | Admitting: Family Medicine

## 2021-06-15 VITALS — Wt 205.0 lb

## 2021-06-15 DIAGNOSIS — R000 Tachycardia, unspecified: Secondary | ICD-10-CM | POA: Diagnosis not present

## 2021-06-15 DIAGNOSIS — R251 Tremor, unspecified: Secondary | ICD-10-CM

## 2021-06-15 DIAGNOSIS — R634 Abnormal weight loss: Secondary | ICD-10-CM | POA: Diagnosis not present

## 2021-06-15 MED ORDER — METOPROLOL SUCCINATE ER 25 MG PO TB24: 25.0000 mg | ORAL_TABLET | Freq: Every day | ORAL | 0 refills | Status: DC

## 2021-06-15 NOTE — Progress Notes (Signed)
Patient is very concern as to why he's losing weight .  Patient was a previous patient of Dr. Earlene Plater

## 2021-06-18 ENCOUNTER — Telehealth: Payer: Medicare Other | Admitting: Nurse Practitioner

## 2021-06-18 NOTE — Progress Notes (Signed)
Established Patient Office Visit  Subjective:  Patient ID: Lawrence Cantu, male    DOB: [birth year]  Age: 35 y.o. MRN: [id number]  CC:  Chief Complaint  Patient presents with   Follow-up    Thyroid     HPI Lawrence Cantu presents for complaint of weight loss over the past  couple of weeks/months. Patient is unsure of when and what he weighed. He also reports a tremor in his hands.   Past Medical History:  Diagnosis Date   Ankylosing spondylitis (HCC)    Anxiety    Arthritis    Phreesia 12/05/2019   BPH (benign prostatic hyperplasia)    Cauda equina syndrome (HCC)    Chronic pain    DDD (degenerative disc disease), cervical    DDD (degenerative disc disease), lumbar    Depression    Phreesia 12/05/2019   Enlarged prostate    Hyperlipidemia    Hypertension    pt denies   Neuromuscular disorder (HCC)    Phreesia 12/05/2019   Pneumonia    x 2 as a child, 1 x as an adult   Post laminectomy syndrome    Sarcoidosis    Spinal stenosis     Past Surgical History:  Procedure Laterality Date   ANAL RECTAL MANOMETRY N/A 01/17/2021   Procedure: ANO RECTAL MANOMETRY;  Surgeon: Benancio Deeds, MD;  Location: [facility];  Service: Gastroenterology;  Laterality: N/A;   BRONCHIAL WASHINGS  06/13/2020   Procedure: BRONCHIAL WASHINGS;  Surgeon: Leslye Peer, MD;  Location: [facility];  Service: Pulmonary;;   CERVICAL FUSION     FINE NEEDLE ASPIRATION  06/13/2020   Procedure: FINE NEEDLE ASPIRATION (FNA) LINEAR;  Surgeon: Leslye Peer, MD;  Location: [facility];  Service: Pulmonary;;   IR FL GUIDED LOC OF NEEDLE/CATH TIP FOR SPINAL INJECTION LT     has had at least 10 of these   LAMINECTOMY AND MICRODISCECTOMY LUMBAR SPINE     SPINAL CORD STIMULATOR IMPLANT  ?2018   SPINAL FUSION     SPINE SURGERY N/A    Phreesia 12/05/2019   VIDEO BRONCHOSCOPY WITH ENDOBRONCHIAL ULTRASOUND Bilateral 06/13/2020   Procedure: VIDEO BRONCHOSCOPY WITH ENDOBRONCHIAL ULTRASOUND;  Surgeon:  Leslye Peer, MD;  Location[facility];  Service: Pulmonary;  Laterality: Bilateral;    Family History  Problem Relation Age of Onset   Depression Mother    Breast cancer Mother    Depression Father    Thyroid cancer Father    Depression Sister    Depression Brother    Hypertension Maternal Grandmother    Hyperlipidemia Maternal Grandmother    Heart disease Maternal Grandmother    Atrial fibrillation Maternal Grandmother    Diabetes Maternal Grandmother     Social History   Socioeconomic History   Marital status: Married    Spouse name: Scientist, water quality   Number of children: 0   Years of education: Not on file   Highest education level: Not on file  Occupational History   Occupation: disabled  Tobacco Use   Smoking status: Former    Packs/day: 0.50    Years: 5.00    Pack years: 2.50    Types: Cigarettes    Quit date: 04/20/2014    Years since quitting: 7.1   Smokeless tobacco: Never  Vaping Use   Vaping Use: Never used  Substance and Sexual Activity   Alcohol use: Yes    Alcohol/week: 2.0 standard drinks    Types: 2 Standard drinks or equivalent per  week    Comment: 1 beer a week   Drug use: Never   Sexual activity: Not on file  Other Topics Concern   Not on file  Social History Narrative   Right Handed    Lives in a two story home    Social Determinants of Health   Financial Resource Strain: Not on file  Food Insecurity: Not on file  Transportation Needs: Not on file  Physical Activity: Not on file  Stress: Not on file  Social Connections: Not on file  Intimate Partner Violence: Not on file    ROS Review of Systems  Constitutional:  Positive for appetite change and unexpected weight change.  Cardiovascular:  Negative for palpitations.  Neurological:  Positive for tremors, seizures, syncope and speech difficulty.  All other systems reviewed and are negative.  Objective:   Today's Vitals: There were no vitals taken for this visit.  Physical  Exam Vitals and nursing note reviewed.  Constitutional:      General: He is not in acute distress. Cardiovascular:     Rate and Rhythm: Regular rhythm. Tachycardia present.  Pulmonary:     Effort: Pulmonary effort is normal.     Breath sounds: Normal breath sounds.  Abdominal:     Palpations: Abdomen is soft.     Tenderness: There is no abdominal tenderness.  Neurological:     General: No focal deficit present.     Mental Status: He is alert and oriented to person, place, and time.     Motor: Tremor (fine tremor) present.  Psychiatric:        Mood and Affect: Mood is anxious.        Behavior: Behavior is agitated. Behavior is cooperative.    1. Loss of weight Did not have documentation to ascertain how much or how fast any weight loss has been. Discussed dietary and activity options. Patient to start with daily multivitamin and protein drink supplementation 2-3 times daily and monitor.   2. Tachycardia Metoprolol prescribed - monitor  3. Tremor Metoprolol prescribed - monitor  Outpatient Encounter Medications as of 06/15/2021  Medication Sig   AMBULATORY NON FORMULARY MEDICATION Medication Name: Diltiazem/ lidocaine ointment. Apply a pea sized amount TID for anal fissure   escitalopram (LEXAPRO) 20 MG tablet Take 1 tablet (20 mg total) by mouth in the morning. Dx: Ata.Sales.A   folic acid (FOLVITE) 1 MG tablet TAKE 1 TABLET BY MOUTH EVERY DAY FOR 30 DAYS   gabapentin (NEURONTIN) 600 MG tablet Take 600 mg by mouth 3 (three) times daily. Take two tablets 3 times daily   inFLIXimab-abda (RENFLEXIS) 100 MG SOLR Once every 6 weeks infusion   loratadine (CLARITIN) 10 MG tablet Take 10 mg by mouth at bedtime as needed for allergies.   Melatonin 10 MG TABS Take 10 mg by mouth at bedtime.   methotrexate (RHEUMATREX) 2.5 MG tablet Take 20 mg by mouth once a week.   metoprolol succinate (TOPROL-XL) 25 MG 24 hr tablet Take 1 tablet (25 mg total) by mouth daily.   morphine (MS CONTIN) 15 MG 12  hr tablet Take 15 mg by mouth 2 (two) times daily.    oxyCODONE (OXY IR/ROXICODONE) 5 MG immediate release tablet Take 5 mg by mouth in the morning, at noon, and at bedtime.    tamsulosin (FLOMAX) 0.4 MG CAPS capsule Take 1 capsule (0.4 mg total) by mouth at bedtime.   tiZANidine (ZANAFLEX) 2 MG tablet Take 1 tablet (2 mg total) by mouth 3 (three)  times daily as needed for muscle spasms.   No facility-administered encounter medications on file as of 06/15/2021.    Follow-up: Return in about 4 weeks (around 07/13/2021) for follow up.   Tommie Raymond, MD

## 2021-06-19 ENCOUNTER — Encounter: Payer: Self-pay | Admitting: Family Medicine

## 2021-06-21 ENCOUNTER — Other Ambulatory Visit: Payer: Self-pay | Admitting: Family

## 2021-06-21 DIAGNOSIS — S343XXS Injury of cauda equina, sequela: Secondary | ICD-10-CM

## 2021-07-13 ENCOUNTER — Telehealth: Payer: Self-pay | Admitting: Family Medicine

## 2021-07-13 NOTE — Telephone Encounter (Signed)
Pt states he spoke w/  [facility] and was told his referral was Closed. Since his referral he's had a 40 lb weight loss and is URGENTLY needing to be seen by them and asks if he needs another or what can be done to expedite this process. Please advise and thank you

## 2021-07-17 NOTE — Telephone Encounter (Signed)
Patient was called at told that his referral was opened back up at at endocrinology and they will give a call  with appt soon. Spoke with Barnetta Chapel

## 2021-07-18 ENCOUNTER — Other Ambulatory Visit: Payer: Self-pay

## 2021-07-18 ENCOUNTER — Encounter: Payer: Self-pay | Admitting: Endocrinology

## 2021-07-18 ENCOUNTER — Ambulatory Visit (INDEPENDENT_AMBULATORY_CARE_PROVIDER_SITE_OTHER): Payer: Medicare Other | Admitting: Endocrinology

## 2021-07-18 DIAGNOSIS — E0590 Thyrotoxicosis, unspecified without thyrotoxic crisis or storm: Secondary | ICD-10-CM | POA: Diagnosis not present

## 2021-07-18 LAB — T4, FREE: Free T4: 0.82 ng/dL (ref 0.60–1.60)

## 2021-07-18 LAB — TSH: TSH: 0 u[IU]/mL — ABNORMAL LOW (ref 0.35–5.50)

## 2021-07-18 MED ORDER — METHIMAZOLE 10 MG PO TABS: 10.0000 mg | ORAL_TABLET | Freq: Two times a day (BID) | ORAL | 1 refills | Status: DC

## 2021-07-18 NOTE — Progress Notes (Signed)
Subjective:    Patient ID: Lawrence AgresteSean Cantu, male    DOB: Mar 07, 1986, 36 y.o.   MRN: [id number]  HPI Pt is referred by Angus Selleronya Nichols, NP, for hyperthyroidism.  Pt reports he was dx'ed with hyperthyroidism in late 2022.  He has never been on therapy for this.  He has never had XRT to the anterior neck, or thyroid surgery.   He does not consume non-prescribed thyroid medication.  He has never been on amiodarone.   He has lost 40 lbs, despite good appetite.  He stopped toprol, as he did not feel well in this.  He has tremor, anxiety, and sweating.   Past Medical History:  Diagnosis Date   Ankylosing spondylitis (HCC)    Anxiety    Arthritis    Phreesia 12/05/2019   BPH (benign prostatic hyperplasia)    Cauda equina syndrome (HCC)    Chronic pain    DDD (degenerative disc disease), cervical    DDD (degenerative disc disease), lumbar    Depression    Phreesia 12/05/2019   Enlarged prostate    Hyperlipidemia    Hypertension    pt denies   Neuromuscular disorder (HCC)    Phreesia 12/05/2019   Pneumonia    x 2 as a child, 1 x as an adult   Post laminectomy syndrome    Sarcoidosis    Spinal stenosis     Past Surgical History:  Procedure Laterality Date   ANAL RECTAL MANOMETRY N/A 01/17/2021   Procedure: ANO RECTAL MANOMETRY;  Surgeon: Benancio DeedsArmbruster, Steven P, MD;  Location: [facility];  Service: Gastroenterology;  Laterality: N/A;   BRONCHIAL WASHINGS  06/13/2020   Procedure: BRONCHIAL WASHINGS;  Surgeon: Leslye PeerByrum, Robert S, MD;  Location: [facility];  Service: Pulmonary;;   CERVICAL FUSION     FINE NEEDLE ASPIRATION  06/13/2020   Procedure: FINE NEEDLE ASPIRATION (FNA) LINEAR;  Surgeon: Leslye PeerByrum, Robert S, MD;  Location: [facility];  Service: Pulmonary;;   IR FL GUIDED LOC OF NEEDLE/CATH TIP FOR SPINAL INJECTION LT     has had at least 10 of these   LAMINECTOMY AND MICRODISCECTOMY LUMBAR SPINE     SPINAL CORD STIMULATOR IMPLANT  ?2018   SPINAL FUSION     SPINE SURGERY N/A    Phreesia  12/05/2019   VIDEO BRONCHOSCOPY WITH ENDOBRONCHIAL ULTRASOUND Bilateral 06/13/2020   Procedure: VIDEO BRONCHOSCOPY WITH ENDOBRONCHIAL ULTRASOUND;  Surgeon: Leslye PeerByrum, Robert S, MD;  [facility];  Service: Pulmonary;  Laterality: Bilateral;    Social History   Socioeconomic History   Marital status: Married    Spouse name: Scientist, water qualityTrish Weingart   Number of children: 0   Years of education: Not on file   Highest education level: Not on file  Occupational History   Occupation: disabled  Tobacco Use   Smoking status: Former    Packs/day: 0.50    Years: 5.00    Pack years: 2.50    Types: Cigarettes    Quit date: 04/20/2014    Years since quitting: 7.2   Smokeless tobacco: Never  Vaping Use   Vaping Use: Never used  Substance and Sexual Activity   Alcohol use: Yes    Alcohol/week: 2.0 standard drinks    Types: 2 Standard drinks or equivalent per week    Comment: 1 beer a week   Drug use: Never   Sexual activity: Not on file  Other Topics Concern   Not on file  Social History Narrative   Right Handed    Lives  in a two story home    Social Determinants of Health   Financial Resource Strain: Not on file  Food Insecurity: Not on file  Transportation Needs: Not on file  Physical Activity: Not on file  Stress: Not on file  Social Connections: Not on file  Intimate Partner Violence: Not on file    Current Outpatient Medications on File Prior to Visit  Medication Sig Dispense Refill   AMBULATORY NON FORMULARY MEDICATION Medication Name: Diltiazem/ lidocaine ointment. Apply a pea sized amount TID for anal fissure 30 g 0   escitalopram (LEXAPRO) 20 MG tablet Take 1 tablet (20 mg total) by mouth in the morning. Dx: Ata.Sales.A 90 tablet 0   folic acid (FOLVITE) 1 MG tablet TAKE 1 TABLET BY MOUTH EVERY DAY FOR 30 DAYS     gabapentin (NEURONTIN) 600 MG tablet Take 600 mg by mouth 3 (three) times daily. Take two tablets 3 times daily     inFLIXimab-abda (RENFLEXIS) 100 MG SOLR Once every  6 weeks infusion     loratadine (CLARITIN) 10 MG tablet Take 10 mg by mouth at bedtime as needed for allergies.     Melatonin 10 MG TABS Take 10 mg by mouth at bedtime.     methotrexate (RHEUMATREX) 2.5 MG tablet Take 20 mg by mouth once a week.     morphine (MS CONTIN) 15 MG 12 hr tablet Take 15 mg by mouth 2 (two) times daily.      oxyCODONE (OXY IR/ROXICODONE) 5 MG immediate release tablet Take 5 mg by mouth in the morning, at noon, and at bedtime.      tamsulosin (FLOMAX) 0.4 MG CAPS capsule TAKE 1 CAPSULE BY MOUTH EVERYDAY AT BEDTIME 90 capsule 0   tiZANidine (ZANAFLEX) 2 MG tablet Take 1 tablet (2 mg total) by mouth 3 (three) times daily as needed for muscle spasms. 30 tablet 0   No current facility-administered medications on file prior to visit.    Allergies  Allergen Reactions   Strawberry (Diagnostic) Anaphylaxis   Other Hives    Cat Dander    Family History  Problem Relation Age of Onset   Depression Mother    Breast cancer Mother    Depression Father    Thyroid cancer Father    Depression Sister    Depression Brother    Hypertension Maternal Grandmother    Hyperlipidemia Maternal Grandmother    Heart disease Maternal Grandmother    Atrial fibrillation Maternal Grandmother    Diabetes Maternal Grandmother     BP (!) 142/110 (BP Location: Right Arm, Patient Position: Sitting, Cuff Size: Normal)    Pulse 90    Ht 5\' 10"  (1.778 m)    Wt 198 lb 9.6 oz (90.1 kg)    SpO2 97%    BMI 28.50 kg/m     Review of Systems denies palpitations    Objective:   Physical Exam VS: see vs page GEN: no distress HEAD: head: no deformity eyes: no periorbital swelling, no proptosis external nose and ears are normal NECK: a healed scar is present.  I do not appreciate a nodule in the thyroid or elsewhere in the neck.   CHEST WALL: no deformity LUNGS: clear to auscultation CV: reg rate and rhythm, no murmur.  MUSCULOSKELETAL: gait is steady, with a cane EXTEMITIES: no deformity.   no leg edema NEURO:  readily moves all 4's.  sensation is intact to touch on all 4's.  Slight tremor SKIN:  Normal texture and temperature.  No  rash or suspicious lesion is visible.   Not diaphoretic NODES:  None palpable at the neck.  PSYCH: alert, well-oriented.  Does not appear anxious nor depressed.  [place]: Mild diffuse heterogeneity of the thyroid parenchyma without discrete nodule.     I have reviewed outside records, and summarized: Pt was noted to have low TSH and referred here.  Main symptom was weight loss.  Lab Results  Component Value Date   TSH <0.005 (L) 05/16/2021   T4TOTAL 11.3 05/16/2021     Assessment & Plan:  Hyperthyroidism.  We discussed rx options.  Due to severity, he should take thionamide first  Patient Instructions  Your blood pressure is high today.  Please continue to follow this with your primary care provider.  Blood tests are requested for you today.  We'll let you know about the results.  If it is high again, I'll prescribe for you a pill to slow it down.   If ever you have fever while taking methimazole, stop it and call us, even if the reason is obvious, because of the risk of a rare side-effect. It is best to never miss the medication.  However, if you do miss it, next best is to double up the next time. Please come back for a follow-up appointment in 3-4 weeks.

## 2021-07-18 NOTE — Patient Instructions (Addendum)
Your blood pressure is high today.  Please continue to follow this with your primary care provider.  Blood tests are requested for you today.  We'll let you know about the results.  If it is high again, I'll prescribe for you a pill to slow it down.   If ever you have fever while taking methimazole, stop it and call us, even if the reason is obvious, because of the risk of a rare side-effect. It is best to never miss the medication.  However, if you do miss it, next best is to double up the next time. Please come back for a follow-up appointment in 3-4 weeks.

## 2021-07-19 ENCOUNTER — Encounter: Payer: Self-pay | Admitting: Endocrinology

## 2021-07-26 ENCOUNTER — Other Ambulatory Visit: Payer: Self-pay | Admitting: Nurse Practitioner

## 2021-07-26 DIAGNOSIS — F32A Depression, unspecified: Secondary | ICD-10-CM

## 2021-08-14 ENCOUNTER — Other Ambulatory Visit: Payer: Self-pay

## 2021-08-14 ENCOUNTER — Ambulatory Visit (INDEPENDENT_AMBULATORY_CARE_PROVIDER_SITE_OTHER): Payer: Medicare Other | Admitting: Endocrinology

## 2021-08-14 ENCOUNTER — Encounter: Payer: Self-pay | Admitting: Endocrinology

## 2021-08-14 VITALS — BP 136/88 | HR 94 | Ht 70.0 in | Wt 204.4 lb

## 2021-08-14 DIAGNOSIS — E0590 Thyrotoxicosis, unspecified without thyrotoxic crisis or storm: Secondary | ICD-10-CM

## 2021-08-14 LAB — TSH: TSH: 3.27 u[IU]/mL (ref 0.35–5.50)

## 2021-08-14 LAB — T4, FREE: Free T4: 0.55 ng/dL — ABNORMAL LOW (ref 0.60–1.60)

## 2021-08-14 MED ORDER — METHIMAZOLE 10 MG PO TABS: 10.0000 mg | ORAL_TABLET | Freq: Every day | ORAL | 1 refills | Status: DC

## 2021-08-14 NOTE — Patient Instructions (Addendum)
Blood tests are requested for you today.  We'll let you know about the results.     If ever you have fever while taking methimazole, stop it and call us, even if the reason is obvious, because of the risk of a rare side-effect.   It is best to never miss the medication.  However, if you do miss it, next best is to double up the next time.   Please come back for a follow-up appointment in 6 weeks.   

## 2021-08-14 NOTE — Progress Notes (Signed)
Subjective:    Patient ID: Lawrence Cantu, male    DOB: 27-Aug-1985, 36 y.o.   MRN: [id number]  HPI Pt returns for f/u of hyperthyroidism (dx'ed 2022; he was rx'ed tapazole, due to weight loss; US showed mild diffuse heterogeneity).  Since on tapazole, pt states he feels well in general.  He has regained a few lbs.   Past Medical History:  Diagnosis Date   Ankylosing spondylitis (HCC)    Anxiety    Arthritis    Phreesia 12/05/2019   BPH (benign prostatic hyperplasia)    Cauda equina syndrome (HCC)    Chronic pain    DDD (degenerative disc disease), cervical    DDD (degenerative disc disease), lumbar    Depression    Phreesia 12/05/2019   Enlarged prostate    Hyperlipidemia    Hypertension    pt denies   Neuromuscular disorder (HCC)    Phreesia 12/05/2019   Pneumonia    x 2 as a child, 1 x as an adult   Post laminectomy syndrome    Sarcoidosis    Spinal stenosis     Past Surgical History:  Procedure Laterality Date   ANAL RECTAL MANOMETRY N/A 01/17/2021   Procedure: ANO RECTAL MANOMETRY;  Surgeon: Benancio Deeds, MD;  Location: [facility];  Service: Gastroenterology;  Laterality: N/A;   BRONCHIAL WASHINGS  06/13/2020   Procedure: BRONCHIAL WASHINGS;  Surgeon: Leslye Peer, MD;  Location: [facility];  Service: Pulmonary;;   CERVICAL FUSION     FINE NEEDLE ASPIRATION  06/13/2020   Procedure: FINE NEEDLE ASPIRATION (FNA) LINEAR;  Surgeon: Leslye Peer, MD;  Location: [facility];  Service: Pulmonary;;   IR FL GUIDED LOC OF NEEDLE/CATH TIP FOR SPINAL INJECTION LT     has had at least 10 of these   LAMINECTOMY AND MICRODISCECTOMY LUMBAR SPINE     SPINAL CORD STIMULATOR IMPLANT  ?2018   SPINAL FUSION     SPINE SURGERY N/A    Phreesia 12/05/2019   VIDEO BRONCHOSCOPY WITH ENDOBRONCHIAL ULTRASOUND Bilateral 06/13/2020   Procedure: VIDEO BRONCHOSCOPY WITH ENDOBRONCHIAL ULTRASOUND;  Surgeon: Leslye Peer, MD;  Location: [facility];  Service: Pulmonary;   Laterality: Bilateral;    Social History   Socioeconomic History   Marital status: Married    Spouse name: Scientist, water quality   Number of children: 0   Years of education: Not on file   Highest education level: Not on file  Occupational History   Occupation: disabled  Tobacco Use   Smoking status: Former    Packs/day: 0.50    Years: 5.00    Pack years: 2.50    Types: Cigarettes    Quit date: 04/20/2014    Years since quitting: 7.3   Smokeless tobacco: Never  Vaping Use   Vaping Use: Never used  Substance and Sexual Activity   Alcohol use: Yes    Alcohol/week: 2.0 standard drinks    Types: 2 Standard drinks or equivalent per week    Comment: 1 beer a week   Drug use: Never   Sexual activity: Not on file  Other Topics Concern   Not on file  Social History Narrative   Right Handed    Lives in a two story home    Social Determinants of Health   Financial Resource Strain: Not on file  Food Insecurity: Not on file  Transportation Needs: Not on file  Physical Activity: Not on file  Stress: Not on file  Social Connections: Not on  file  Intimate Partner Violence: Not on file    Current Outpatient Medications on File Prior to Visit  Medication Sig Dispense Refill   AMBULATORY NON FORMULARY MEDICATION Medication Name: Diltiazem/ lidocaine ointment. Apply a pea sized amount TID for anal fissure 30 g 0   escitalopram (LEXAPRO) 20 MG tablet Take 1 tablet (20 mg total) by mouth in the morning. Dx: Ata.Sales.A 90 tablet 0   folic acid (FOLVITE) 1 MG tablet TAKE 1 TABLET BY MOUTH EVERY DAY FOR 30 DAYS     gabapentin (NEURONTIN) 600 MG tablet Take 600 mg by mouth 3 (three) times daily. Take two tablets 3 times daily     inFLIXimab-abda (RENFLEXIS) 100 MG SOLR Once every 6 weeks infusion     loratadine (CLARITIN) 10 MG tablet Take 10 mg by mouth at bedtime as needed for allergies.     Melatonin 10 MG TABS Take 10 mg by mouth at bedtime.     methotrexate (RHEUMATREX) 2.5 MG tablet Take 20  mg by mouth once a week.     morphine (MS CONTIN) 15 MG 12 hr tablet Take 15 mg by mouth 2 (two) times daily.      oxyCODONE (OXY IR/ROXICODONE) 5 MG immediate release tablet Take 5 mg by mouth in the morning, at noon, and at bedtime.      tamsulosin (FLOMAX) 0.4 MG CAPS capsule TAKE 1 CAPSULE BY MOUTH EVERYDAY AT BEDTIME 90 capsule 0   tiZANidine (ZANAFLEX) 2 MG tablet Take 1 tablet (2 mg total) by mouth 3 (three) times daily as needed for muscle spasms. 30 tablet 0   No current facility-administered medications on file prior to visit.    Allergies  Allergen Reactions   Strawberry (Diagnostic) Anaphylaxis   Other Hives    Cat Dander    Family History  Problem Relation Age of Onset   Depression Mother    Breast cancer Mother    Depression Father    Thyroid cancer Father    Depression Sister    Depression Brother    Hypertension Maternal Grandmother    Hyperlipidemia Maternal Grandmother    Heart disease Maternal Grandmother    Atrial fibrillation Maternal Grandmother    Diabetes Maternal Grandmother     BP 136/88 (BP Location: Left Arm, Patient Position: Sitting, Cuff Size: Normal)    Pulse 94    Ht 5\' 10"  (1.778 m)    Wt 204 lb 6.4 oz (92.7 kg)    SpO2 94%    BMI 29.33 kg/m    Review of Systems Denies fever.      Objective:   Physical Exam VITAL SIGNS:  See vs page GENERAL: no distress NECK: There is no palpable thyroid enlargement.  No thyroid nodule is palpable.  No palpable lymphadenopathy at the anterior neck.   Lab Results  Component Value Date   TSH 3.27 08/14/2021   T4TOTAL 11.3 05/16/2021      Assessment & Plan:  Hyperthyroidism: overcontrolled.  Reduce tapazole to 10/d

## 2021-08-16 ENCOUNTER — Telehealth: Payer: Self-pay | Admitting: Family Medicine

## 2021-08-16 NOTE — Telephone Encounter (Signed)
°  escitalopram (LEXAPRO) 20 MG tablet [id number]   Pharmacy  [id number] [address], [place] [address]  [place], [place] [address]  Phone:  [phone]  Fax:  [phone]  DEA #:  [id number]

## 2021-08-17 ENCOUNTER — Other Ambulatory Visit: Payer: Self-pay | Admitting: *Deleted

## 2021-08-17 DIAGNOSIS — F32A Depression, unspecified: Secondary | ICD-10-CM

## 2021-08-17 MED ORDER — ESCITALOPRAM OXALATE 20 MG PO TABS: 20.0000 mg | ORAL_TABLET | Freq: Every morning | ORAL | 0 refills | Status: DC

## 2021-08-20 ENCOUNTER — Other Ambulatory Visit: Payer: Self-pay | Admitting: Family Medicine

## 2021-08-20 DIAGNOSIS — F32A Depression, unspecified: Secondary | ICD-10-CM

## 2021-08-20 MED ORDER — ESCITALOPRAM OXALATE 20 MG PO TABS: 20.0000 mg | ORAL_TABLET | Freq: Every morning | ORAL | 0 refills | Status: DC

## 2021-08-30 ENCOUNTER — Other Ambulatory Visit: Payer: Self-pay | Admitting: Neurosurgery

## 2021-08-30 DIAGNOSIS — M456 Ankylosing spondylitis lumbar region: Secondary | ICD-10-CM

## 2021-09-07 ENCOUNTER — Ambulatory Visit (INDEPENDENT_AMBULATORY_CARE_PROVIDER_SITE_OTHER): Payer: Medicare Other

## 2021-09-07 ENCOUNTER — Other Ambulatory Visit: Payer: Self-pay

## 2021-09-07 VITALS — Ht 70.0 in | Wt 205.0 lb

## 2021-09-07 DIAGNOSIS — Z0000 Encounter for general adult medical examination without abnormal findings: Secondary | ICD-10-CM

## 2021-09-07 DIAGNOSIS — Z1159 Encounter for screening for other viral diseases: Secondary | ICD-10-CM

## 2021-09-07 NOTE — Patient Instructions (Signed)

## 2021-09-07 NOTE — Progress Notes (Signed)
I connected with  Lawrence Cantu on 09/07/21 by a audio enabled telemedicine application and verified that I am speaking with the correct person using two identifiers.  Patient Location: Home  Provider Location: Home Office  I discussed the limitations of evaluation and management by telemedicine. The patient expressed understanding and agreed to proceed.  Subjective:   Lawrence Cantu is a 36 y.o. male who presents for an Initial Medicare Annual Wellness Visit.       Objective:    Today's Vitals   09/07/21 1159  Weight: 205 lb (93 kg)  Height: 5\' 10"  (1.778 m)   Body mass index is 29.41 kg/m.  Advanced Directives 05/24/2021 03/23/2021 06/13/2020 12/18/2019  Does Patient Have a Medical Advance Directive? No No No No  Would patient like information on creating a medical advance directive? No - Patient declined - - No - Patient declined    Current Medications (verified) Outpatient Encounter Medications as of 09/07/2021  Medication Sig   escitalopram (LEXAPRO) 20 MG tablet Take 1 tablet (20 mg total) by mouth in the morning. Dx: 11/07/2021.A   folic acid (FOLVITE) 1 MG tablet TAKE 1 TABLET BY MOUTH EVERY DAY FOR 30 DAYS   gabapentin (NEURONTIN) 600 MG tablet Take 600 mg by mouth 3 (three) times daily. Take two tablets 3 times daily   inFLIXimab-abda (RENFLEXIS) 100 MG SOLR Once every 6 weeks infusion   loratadine (CLARITIN) 10 MG tablet Take 10 mg by mouth at bedtime as needed for allergies.   Melatonin 10 MG TABS Take 10 mg by mouth at bedtime.   methimazole (TAPAZOLE) 10 MG tablet Take 1 tablet (10 mg total) by mouth daily.   methotrexate (RHEUMATREX) 2.5 MG tablet Take 20 mg by mouth once a week.   morphine (MS CONTIN) 15 MG 12 hr tablet Take 15 mg by mouth 2 (two) times daily.    oxyCODONE (OXY IR/ROXICODONE) 5 MG immediate release tablet Take 5 mg by mouth in the morning, at noon, and at bedtime.    tamsulosin (FLOMAX) 0.4 MG CAPS capsule TAKE 1 CAPSULE BY MOUTH EVERYDAY AT BEDTIME    tiZANidine (ZANAFLEX) 2 MG tablet Take 1 tablet (2 mg total) by mouth 3 (three) times daily as needed for muscle spasms.   UNABLE TO FIND Med Name: conception xr once daily   [DISCONTINUED] AMBULATORY NON FORMULARY MEDICATION Medication Name: Diltiazem/ lidocaine ointment. Apply a pea sized amount TID for anal fissure (Patient not taking: Reported on 09/07/2021)   No facility-administered encounter medications on file as of 09/07/2021.    Allergies (verified) Strawberry (diagnostic) and Other   History: Past Medical History:  Diagnosis Date   Ankylosing spondylitis (HCC)    Anxiety    Arthritis    Phreesia 12/05/2019   BPH (benign prostatic hyperplasia)    Cauda equina syndrome (HCC)    Chronic pain    DDD (degenerative disc disease), cervical    DDD (degenerative disc disease), lumbar    Depression    Phreesia 12/05/2019   Enlarged prostate    Hyperlipidemia    Hypertension    pt denies   Neuromuscular disorder (HCC)    Phreesia 12/05/2019   Pneumonia    x 2 as a child, 1 x as an adult   Post laminectomy syndrome    Sarcoidosis    Spinal stenosis    Past Surgical History:  Procedure Laterality Date   ANAL RECTAL MANOMETRY N/A 01/17/2021   Procedure: ANO RECTAL MANOMETRY;  Surgeon: 01/19/2021, MD;  Location: [facility];  Service: Gastroenterology;  Laterality: N/A;   BRONCHIAL WASHINGS  06/13/2020   Procedure: BRONCHIAL WASHINGS;  Surgeon: Leslye PeerByrum, Robert S, MD;  Location: [facility];  Service: Pulmonary;;   CERVICAL FUSION     FINE NEEDLE ASPIRATION  06/13/2020   Procedure: FINE NEEDLE ASPIRATION (FNA) LINEAR;  Surgeon: Leslye PeerByrum, Robert S, MD;  Location: [facility];  Service: Pulmonary;;   IR FL GUIDED LOC OF NEEDLE/CATH TIP FOR SPINAL INJECTION LT     has had at least 10 of these   LAMINECTOMY AND MICRODISCECTOMY LUMBAR SPINE     SPINAL CORD STIMULATOR IMPLANT  ?2018   SPINAL FUSION     SPINE SURGERY N/A    Phreesia 12/05/2019   VIDEO BRONCHOSCOPY WITH  ENDOBRONCHIAL ULTRASOUND Bilateral 06/13/2020   Procedure: VIDEO BRONCHOSCOPY WITH ENDOBRONCHIAL ULTRASOUND;  Surgeon: Leslye PeerByrum, Robert S, MD;  [facility];  Service: Pulmonary;  Laterality: Bilateral;   Family History  Problem Relation Age of Onset   Depression Mother    Breast cancer Mother    Depression Father    Thyroid cancer Father    Depression Sister    Depression Brother    Hypertension Maternal Grandmother    Hyperlipidemia Maternal Grandmother    Heart disease Maternal Grandmother    Atrial fibrillation Maternal Grandmother    Diabetes Maternal Grandmother    Heart attack Maternal Grandfather    Social History   Socioeconomic History   Marital status: Married    Spouse name: Scientist, water qualityTrish Mounger   Number of children: 0   Years of education: Not on file   Highest education level: Not on file  Occupational History   Occupation: disabled  Tobacco Use   Smoking status: Former    Packs/day: 0.50    Years: 5.00    Pack years: 2.50    Types: Cigarettes    Quit date: 04/20/2014    Years since quitting: 7.3   Smokeless tobacco: Never  Vaping Use   Vaping Use: Never used  Substance and Sexual Activity   Alcohol use: Yes    Alcohol/week: 2.0 standard drinks    Types: 2 Standard drinks or equivalent per week    Comment: 1 beer a week   Drug use: Never   Sexual activity: Not on file  Other Topics Concern   Not on file  Social History Narrative   Right Handed    Lives in a two story home    Social Determinants of Health   Financial Resource Strain: Low Risk    Difficulty of Paying Living Expenses: Not hard at all  Food Insecurity: No Food Insecurity   Worried About Programme researcher, broadcasting/film/videounning Out of Food in the Last Year: Never true   Ran Out of Food in the Last Year: Never true  Transportation Needs: No Transportation Needs   Lack of Transportation (Medical): No   Lack of Transportation (Non-Medical): No  Physical Activity: Inactive   Days of Exercise per Week: 0 days    Minutes of Exercise per Session: 0 min  Stress: No Stress Concern Present   Feeling of Stress : Not at all  Social Connections: Unknown   Frequency of Communication with Friends and Family: Twice a week   Frequency of Social Gatherings with Friends and Family: Once a week   Attends Religious Services: Patient refused   Database administratorActive Member of Clubs or Organizations: No   Attends BankerClub or Organization Meetings: Never   Marital Status: Married    Tobacco Counseling Counseling given: Not  Answered   Clinical Intake:  Pre-visit preparation completed: Yes              Diabetic?no         Activities of Daily Living In your present state of health, do you have any difficulty performing the following activities: 09/07/2021  Hearing? N  Vision? N  Difficulty concentrating or making decisions? Y  Walking or climbing stairs? Y  Comment uses cane  Dressing or bathing? N  Doing errands, shopping? Y  Some recent data might be hidden    Patient Care Team: Georganna Skeans, MD as PCP - General (Family Medicine) Glendale Chard, DO as Consulting Physician (Neurology)  Indicate any recent Medical Services you may have received from other than [facility] providers in the past year (date may be approximate).     Assessment:   This is a routine wellness examination for Lawrence Cantu.  Hearing/Vision screen No results found.  Dietary issues and exercise activities discussed:     Goals Addressed   None   Depression Screen PHQ 2/9 Scores 06/15/2021 03/13/2021 01/23/2021 12/19/2020 03/31/2020 12/07/2019  PHQ - 2 Score 4 0 0 0 - 1  PHQ- 9 Score 18 - 0 0 - 3  Exception Documentation - - - - Other- indicate reason in comment box -    Fall Risk Fall Risk  03/23/2021 12/19/2020 12/07/2019  Falls in the past year? 1 0 0  Number falls in past yr: 1 0 0  Injury with Fall? 0 0 0  Risk for fall due to : - No Fall Risks -  Follow up - Falls evaluation completed -    FALL RISK PREVENTION PERTAINING TO THE  HOME:  Any stairs in or around the home? Yes  If so, are there any without handrails? Yes  Home free of loose throw rugs in walkways, pet beds, electrical cords, etc? Yes  Adequate lighting in your home to reduce risk of falls? Yes   ASSISTIVE DEVICES UTILIZED TO PREVENT FALLS:  Life alert? No  Use of a cane, walker or w/c? Yes  Grab bars in the bathroom? No  Shower chair or bench in shower? Yes  Elevated toilet seat or a handicapped toilet? Yes   TIMED UP AND GO:  Was the test performed?  n/a .  Length of time to ambulate 10 feet: n/a sec.    Cognitive Function:     6CIT Screen 09/07/2021  What Year? 0 points  What month? 0 points  What time? 0 points  Count back from 20 0 points  Months in reverse 0 points  Repeat phrase 0 points  Total Score 0    Immunizations Immunization History  Administered Date(s) Administered   Influenza Whole 05/15/2020   Influenza,inj,Quad PF,6+ Mos 08/12/2014   PFIZER(Purple Top)SARS-COV-2 Vaccination 11/04/2019, 11/29/2019, 12/08/2020    TDAP status: Due, Education has been provided regarding the importance of this vaccine. Advised may receive this vaccine at local pharmacy or Health Dept. Aware to provide a copy of the vaccination record if obtained from local pharmacy or Health Dept. Verbalized acceptance and understanding.  Flu Vaccine status: Due, Education has been provided regarding the importance of this vaccine. Advised may receive this vaccine at local pharmacy or Health Dept. Aware to provide a copy of the vaccination record if obtained from local pharmacy or Health Dept. Verbalized acceptance and understanding.  Pneumococcal vaccine status: Due, Education has been provided regarding the importance of this vaccine. Advised may receive this vaccine at local pharmacy  or Health Dept. Aware to provide a copy of the vaccination record if obtained from local pharmacy or Health Dept. Verbalized acceptance and understanding.  Covid-19  vaccine status: Completed vaccines  Qualifies for Shingles Vaccine? No   Zostavax completed No   Shingrix Completed?: No.    Education has been provided regarding the importance of this vaccine. Patient has been advised to call insurance company to determine out of pocket expense if they have not yet received this vaccine. Advised may also receive vaccine at local pharmacy or Health Dept. Verbalized acceptance and understanding.  Screening Tests Health Maintenance  Topic Date Due   Hepatitis C Screening  Never done   TETANUS/TDAP  Never done   COVID-19 Vaccine (4 - Booster for Pfizer series) 02/02/2021   INFLUENZA VACCINE  02/05/2021   HIV Screening  Completed   HPV VACCINES  Aged Out    Health Maintenance  Health Maintenance Due  Topic Date Due   Hepatitis C Screening  Never done   TETANUS/TDAP  Never done   COVID-19 Vaccine (4 - Booster for Pfizer series) 02/02/2021   INFLUENZA VACCINE  02/05/2021    Colorectal cancer screening: No longer required.   Lung Cancer Screening: (Low Dose CT Chest recommended if Age 71-80 years, 30 pack-year currently smoking OR have quit w/in 15years.) does not qualify.   Lung Cancer Screening Referral: n/a  Additional Screening:  Hepatitis C Screening: does qualify; Completed n/a pt will have to contact office to have screening completed.  Vision Screening: Recommended annual ophthalmology exams for early detection of glaucoma and other disorders of the eye. Is the patient up to date with their annual eye exam?  Yes  Who is the provider or what is the name of the office in which the patient attends annual eye exams? [facility] Dr.Bowen If pt is not established with a provider, would they like to be referred to a provider to establish care?  N/a .   Dental Screening: Recommended annual dental exams for proper oral hygiene  Community Resource Referral / Chronic Care Management: CRR required this visit?  No   CCM required this  visit?  No      Plan:     I have personally reviewed and noted the following in the patients chart:   Medical and social history Use of alcohol, tobacco or illicit drugs  Current medications and supplements including opioid prescriptions. Patient is currently taking opioid prescriptions. Information provided to patient regarding non-opioid alternatives. Patient advised to discuss non-opioid treatment plan with their provider. Functional ability and status Nutritional status Physical activity Advanced directives List of other physicians Hospitalizations, surgeries, and ER visits in previous 12 months Vitals Screenings to include cognitive, depression, and falls Referrals and appointments  In addition, I have reviewed and discussed with patient certain preventive protocols, quality metrics, and best practice recommendations. A written personalized care plan for preventive services as well as general preventive health recommendations were provided to patient.     Patrecia Pour Lawrence Cantu, CMA   09/07/2021   Nurse Notes: Patient reports he received his first covid booster but not the second one that is due which appears in his chart.  Patient is 44yrs he does not require a colorectal screening.  Patient informed of upcoming appointment with pcp. he was advised to let provider know he would like to be screened for hep c.

## 2021-09-10 ENCOUNTER — Ambulatory Visit: Payer: Medicare Other | Admitting: Family Medicine

## 2021-09-12 ENCOUNTER — Ambulatory Visit (INDEPENDENT_AMBULATORY_CARE_PROVIDER_SITE_OTHER): Payer: Medicare Other | Admitting: Family Medicine

## 2021-09-12 ENCOUNTER — Other Ambulatory Visit: Payer: Self-pay

## 2021-09-12 ENCOUNTER — Encounter: Payer: Self-pay | Admitting: Family Medicine

## 2021-09-12 DIAGNOSIS — F32A Depression, unspecified: Secondary | ICD-10-CM

## 2021-09-12 MED ORDER — ESCITALOPRAM OXALATE 20 MG PO TABS: 20.0000 mg | ORAL_TABLET | Freq: Every morning | ORAL | 1 refills | Status: DC

## 2021-09-12 NOTE — Progress Notes (Signed)
Virtual Visit via Telephone Note ? ?I connected with Lawrence Cantu on 09/12/21 at 10:40 AM EST by telephone and verified that I am speaking with the correct person using two identifiers. ? ?Location: ?Patient: home ?Provider: office ?  ?I discussed the limitations, risks, security and privacy concerns of performing an evaluation and management service by telephone and the availability of in person appointments. I also discussed with the patient that there may be a patient responsible charge related to this service. The patient expressed understanding and agreed to proceed. ? ? ?History of Present Illness:  ?Patient with follow up of lexapro that was prescribed last OV for depression. Patient reports that he is doing much better and also sees a counselor on a weekly basis that is also going well.  ?  ?Observations/Objective: ? ? ?Assessment and Plan: ?1. Depression, unspecified depression type ?Meds refilled ?- escitalopram (LEXAPRO) 20 MG tablet; Take 1 tablet (20 mg total) by mouth in the morning. Dx: Ata.Sales.A  Dispense: 90 tablet; Refill: 1 ? ? ?Follow Up Instructions: ?6 months ?  ?I discussed the assessment and treatment plan with the patient. The patient was provided an opportunity to ask questions and all were answered. The patient agreed with the plan and demonstrated an understanding of the instructions. ?  ?The patient was advised to call back or seek an in-person evaluation if the symptoms worsen or if the condition fails to improve as anticipated. ? ?I provided 5 minutes of non-face-to-face time during this encounter. ? ? ?Tommie Raymond, MD  ?

## 2021-09-14 ENCOUNTER — Other Ambulatory Visit: Payer: Self-pay | Admitting: *Deleted

## 2021-09-14 DIAGNOSIS — S343XXS Injury of cauda equina, sequela: Secondary | ICD-10-CM

## 2021-09-14 MED ORDER — TAMSULOSIN HCL 0.4 MG PO CAPS: ORAL_CAPSULE | ORAL | 0 refills | Status: DC

## 2021-09-19 ENCOUNTER — Ambulatory Visit
Admission: RE | Admit: 2021-09-19 | Discharge: 2021-09-19 | Disposition: A | Discharge status: Home / Self Care | Payer: Medicare Other | Source: Ambulatory Visit | Attending: Neurosurgery | Admitting: Neurosurgery

## 2021-09-19 DIAGNOSIS — M456 Ankylosing spondylitis lumbar region: Secondary | ICD-10-CM

## 2021-09-25 ENCOUNTER — Ambulatory Visit (INDEPENDENT_AMBULATORY_CARE_PROVIDER_SITE_OTHER): Payer: Medicare Other | Admitting: Endocrinology

## 2021-09-25 ENCOUNTER — Other Ambulatory Visit: Payer: Self-pay | Admitting: *Deleted

## 2021-09-25 ENCOUNTER — Encounter: Payer: Self-pay | Admitting: Endocrinology

## 2021-09-25 ENCOUNTER — Other Ambulatory Visit: Payer: Self-pay

## 2021-09-25 VITALS — BP 112/84 | HR 101 | Ht 70.0 in | Wt 209.6 lb

## 2021-09-25 DIAGNOSIS — E0590 Thyrotoxicosis, unspecified without thyrotoxic crisis or storm: Secondary | ICD-10-CM | POA: Diagnosis not present

## 2021-09-25 LAB — TSH: TSH: 5.1 u[IU]/mL (ref 0.35–5.50)

## 2021-09-25 LAB — T4, FREE: Free T4: 0.64 ng/dL (ref 0.60–1.60)

## 2021-09-25 MED ORDER — METHIMAZOLE 5 MG PO TABS: 5.0000 mg | ORAL_TABLET | Freq: Every day | ORAL | 3 refills | Status: DC

## 2021-09-25 MED ORDER — METOPROLOL SUCCINATE ER 25 MG PO TB24: 25.0000 mg | ORAL_TABLET | Freq: Every day | ORAL | 0 refills | Status: DC

## 2021-09-25 NOTE — Progress Notes (Signed)
? ?Subjective:  ? ? Patient ID: Lawrence Cantu, male    DOB: [birth year], 36 y.o.   MRN: [id number] ? ?HPI ?Pt returns for f/u of hyperthyroidism (dx'ed 2022; he was rx'ed tapazole, due to weight loss; US showed mild diffuse heterogeneity).  Since on tapazole, pt states he feels well in general.   ?Past Medical History:  ?Diagnosis Date  ? Ankylosing spondylitis (HCC)   ? Anxiety   ? Arthritis   ? Phreesia 12/05/2019  ? BPH (benign prostatic hyperplasia)   ? Cauda equina syndrome (HCC)   ? Chronic pain   ? DDD (degenerative disc disease), cervical   ? DDD (degenerative disc disease), lumbar   ? Depression   ? Phreesia 12/05/2019  ? Enlarged prostate   ? Hyperlipidemia   ? Hypertension   ? pt denies  ? Neuromuscular disorder (HCC)   ? Phreesia 12/05/2019  ? Pneumonia   ? x 2 as a child, 1 x as an adult  ? Post laminectomy syndrome   ? Sarcoidosis   ? Spinal stenosis   ? ? ?Past Surgical History:  ?Procedure Laterality Date  ? ANAL RECTAL MANOMETRY N/A 01/17/2021  ? Procedure: ANO RECTAL MANOMETRY;  Surgeon: Benancio DeedsArmbruster, Steven P, MD;  [facility];  Service: Gastroenterology;  Laterality: N/A;  ? BRONCHIAL WASHINGS  06/13/2020  ? Procedure: BRONCHIAL WASHINGS;  Surgeon: Leslye PeerByrum, Robert S, MD;  [facility];  Service: Pulmonary;;  ? CERVICAL FUSION    ? FINE NEEDLE ASPIRATION  06/13/2020  ? Procedure: FINE NEEDLE ASPIRATION (FNA) LINEAR;  Surgeon: Leslye PeerByrum, Robert S, MD;  Location: [facility];  Service: Pulmonary;;  ? IR FL GUIDED LOC OF NEEDLE/CATH TIP FOR SPINAL INJECTION LT    ? has had at least 10 of these  ? LAMINECTOMY AND MICRODISCECTOMY LUMBAR SPINE    ? SPINAL CORD STIMULATOR IMPLANT  ?2018  ? SPINAL FUSION    ? SPINE SURGERY N/A   ? Phreesia 12/05/2019  ? VIDEO BRONCHOSCOPY WITH ENDOBRONCHIAL ULTRASOUND Bilateral 06/13/2020  ? Procedure: VIDEO BRONCHOSCOPY WITH ENDOBRONCHIAL ULTRASOUND;  Surgeon: Leslye PeerByrum, Robert S, MD;  [facility];  Service: Pulmonary;  Laterality: Bilateral;  ? ? ?Social  History  ? ?Socioeconomic History  ? Marital status: Married  ?  Spouse name: Rosann Auerbachrish Royals  ? Number of children: 0  ? Years of education: Not on file  ? Highest education level: Not on file  ?Occupational History  ? Occupation: disabled  ?Tobacco Use  ? Smoking status: Former  ?  Packs/day: 0.50  ?  Years: 5.00  ?  Pack years: 2.50  ?  Types: Cigarettes  ?  Quit date: 04/20/2014  ?  Years since quitting: 7.4  ? Smokeless tobacco: Never  ?Vaping Use  ? Vaping Use: Never used  ?Substance and Sexual Activity  ? Alcohol use: Yes  ?  Alcohol/week: 2.0 standard drinks  ?  Types: 2 Standard drinks or equivalent per week  ?  Comment: 1 beer a week  ? Drug use: Never  ? Sexual activity: Not on file  ?Other Topics Concern  ? Not on file  ?Social History Narrative  ? Right Handed   ? Lives in a two story home   ? ?Social Determinants of Health  ? ?Financial Resource Strain: Low Risk   ? Difficulty of Paying Living Expenses: Not hard at all  ?Food Insecurity: No Food Insecurity  ? Worried About Programme researcher, broadcasting/film/videounning Out of Food in the Last Year: Never true  ? Ran Out  of Food in the Last Year: Never true  ?Transportation Needs: No Transportation Needs  ? Lack of Transportation (Medical): No  ? Lack of Transportation (Non-Medical): No  ?Physical Activity: Inactive  ? Days of Exercise per Week: 0 days  ? Minutes of Exercise per Session: 0 min  ?Stress: No Stress Concern Present  ? Feeling of Stress : Not at all  ?Social Connections: Unknown  ? Frequency of Communication with Friends and Family: Twice a week  ? Frequency of Social Gatherings with Friends and Family: Once a week  ? Attends Religious Services: Patient refused  ? Active Member of Clubs or Organizations: No  ? Attends Banker Meetings: Never  ? Marital Status: Married  ?Intimate Partner Violence: Not At Risk  ? Fear of Current or Ex-Partner: No  ? Emotionally Abused: No  ? Physically Abused: No  ? Sexually Abused: No  ? ? ?Current Outpatient Medications on File Prior  to Visit  ?Medication Sig Dispense Refill  ? escitalopram (LEXAPRO) 20 MG tablet Take 1 tablet (20 mg total) by mouth in the morning. Dx: Ata.Sales.A 90 tablet 1  ? folic acid (FOLVITE) 1 MG tablet TAKE 1 TABLET BY MOUTH EVERY DAY FOR 30 DAYS    ? gabapentin (NEURONTIN) 600 MG tablet Take 600 mg by mouth 3 (three) times daily. Take two tablets 3 times daily    ? inFLIXimab-abda (RENFLEXIS) 100 MG SOLR Once every 6 weeks infusion    ? loratadine (CLARITIN) 10 MG tablet Take 10 mg by mouth at bedtime as needed for allergies.    ? Melatonin 10 MG TABS Take 10 mg by mouth at bedtime.    ? methotrexate (RHEUMATREX) 2.5 MG tablet Take 20 mg by mouth once a week.    ? morphine (MS CONTIN) 15 MG 12 hr tablet Take 15 mg by mouth 2 (two) times daily.     ? oxyCODONE (OXY IR/ROXICODONE) 5 MG immediate release tablet Take 5 mg by mouth in the morning, at noon, and at bedtime.     ? tamsulosin (FLOMAX) 0.4 MG CAPS capsule TAKE 1 CAPSULE BY MOUTH EVERYDAY AT BEDTIME 90 capsule 0  ? tiZANidine (ZANAFLEX) 2 MG tablet Take 1 tablet (2 mg total) by mouth 3 (three) times daily as needed for muscle spasms. 30 tablet 0  ? UNABLE TO FIND Med Name: conception xr once daily    ? ?No current facility-administered medications on file prior to visit.  ? ? ?Allergies  ?Allergen Reactions  ? Strawberry (Diagnostic) Anaphylaxis  ? Other Hives  ?  Cat Dander  ? ? ?Family History  ?Problem Relation Age of Onset  ? Depression Mother   ? Breast cancer Mother   ? Depression Father   ? Thyroid cancer Father   ? Depression Sister   ? Depression Brother   ? Hypertension Maternal Grandmother   ? Hyperlipidemia Maternal Grandmother   ? Heart disease Maternal Grandmother   ? Atrial fibrillation Maternal Grandmother   ? Diabetes Maternal Grandmother   ? Heart attack Maternal Grandfather   ? ? ?BP 112/84 (BP Location: Left Arm, Patient Position: Sitting, Cuff Size: Normal)   Pulse (!) 101   Ht 5\' 10"  (1.778 m)   Wt 209 lb 9.6 oz (95.1 kg)   SpO2 97%   BMI  30.07 kg/m?  ? ? ?Review of Systems ?Denies fever.   ?   ?Objective:  ? Physical Exam ?VITAL SIGNS:  See vs page ?GENERAL: no distress ?NECK: There is  no palpable thyroid enlargement.  No thyroid nodule is palpable.  No palpable lymphadenopathy at the anterior neck.   ? ? ?Lab Results  ?Component Value Date  ? TSH 5.10 09/25/2021  ? T4TOTAL 11.3 05/16/2021  ? ?Free T4=0.64 ?   ?Assessment & Plan:  ?Hyperthyroidism: overcontrolled.  I have sent a prescription to your pharmacy, to reduce methimazole ? ?

## 2021-09-25 NOTE — Patient Instructions (Addendum)
Blood tests are requested for you today.  We'll let you know about the results.   ?If ever you have fever while taking methimazole, stop it and call us, even if the reason is obvious, because of the risk of a rare side-effect.   ?It is best to never miss the medication.  However, if you do miss it, next best is to double up the next time.   ?If you want, you can have the radioactive iodine treatment pill or surgery to remove the thyroid.  Just let us know.   ?Please come back for a follow-up appointment in 3 months.   ?

## 2021-10-04 ENCOUNTER — Telehealth: Payer: Self-pay | Admitting: Family Medicine

## 2021-10-04 NOTE — Telephone Encounter (Signed)
Copied from CRM [id number]. Topic: General - Other ?>> Oct 04, 2021  1:16 PM Jaquita Rector A wrote: ?Reason for CRM: Patient called in to inform Dr Andrey Campanile that he need her to order him an MRI of his neck. Had an Xray that did not show anything so Rhumatologist suggest that PCP orders an MRI ASAP. Per patient neck have gotten worse.  Ph# [phone] ?

## 2021-10-08 ENCOUNTER — Ambulatory Visit: Payer: Self-pay

## 2021-10-08 NOTE — Telephone Encounter (Signed)
? ? ?  Chief Complaint: Pt. Calling again about severe neck pain. Requesting MRI. "My doctor knows all about my history with this." ?Symptoms: Pain ?Frequency: 3 weeks ago started getting worse ?Pertinent Negatives: Patient denies weakness ?Disposition: [] ED /[] Urgent Care (no appt availability in office) / [] Appointment(In office/virtual)/ []  [facility]/ [] Home Care/ [] Refused Recommended Disposition /[] [facility]/ [x]  Follow-up with PCP ?Additional Notes: Please advise pt.   ?Answer Assessment - Initial Assessment Questions ?1. ONSET: "When did the pain begin?"  ?    3 weeks ago ?2. LOCATION: "Where does it hurt?"  ?    Neck - cervical ?3. PATTERN "Does the pain come and go, or has it been constant since it started?"  ?    Constant ?4. SEVERITY: "How bad is the pain?"  (Scale 1-10; or mild, moderate, severe) ?  - NO PAIN (0): no pain or only slight stiffness  ?  - MILD (1-3): doesn't interfere with normal activities  ?  - MODERATE (4-7): interferes with normal activities or awakens from sleep  ?  - SEVERE (8-10):  excruciating pain, unable to do any normal activities  ?    Severe ?5. RADIATION: "Does the pain go anywhere else, shoot into your arms?" ?    Back of arms ?6. CORD SYMPTOMS: "Any weakness or numbness of the arms or legs?" ?    No ?7. CAUSE: "What do you think is causing the neck pain?" ?    Has neck issues ?8. NECK OVERUSE: "Any recent activities that involved turning or twisting the neck?" ?    No ?9. OTHER SYMPTOMS: "Do you have any other symptoms?" (e.g., headache, fever, chest pain, difficulty breathing, neck swelling) ?    No ?10. PREGNANCY: "Is there any chance you are pregnant?" "When was your last menstrual period?" ?      N/a ? ?Protocols used: Neck Pain or Stiffness-A-AH ? ?

## 2021-10-08 NOTE — Telephone Encounter (Signed)
Patient was given an appt.

## 2021-10-09 ENCOUNTER — Ambulatory Visit (INDEPENDENT_AMBULATORY_CARE_PROVIDER_SITE_OTHER): Payer: Medicare Other | Admitting: Family Medicine

## 2021-10-09 ENCOUNTER — Encounter: Payer: Self-pay | Admitting: Family Medicine

## 2021-10-09 VITALS — BP 114/79 | HR 76 | Temp 98.5°F | Resp 16 | Wt 212.0 lb

## 2021-10-09 DIAGNOSIS — M5030 Other cervical disc degeneration, unspecified cervical region: Secondary | ICD-10-CM

## 2021-10-09 DIAGNOSIS — F32A Depression, unspecified: Secondary | ICD-10-CM

## 2021-10-09 DIAGNOSIS — Z6834 Body mass index (BMI) 34.0-34.9, adult: Secondary | ICD-10-CM | POA: Insufficient documentation

## 2021-10-09 DIAGNOSIS — E663 Overweight: Secondary | ICD-10-CM | POA: Insufficient documentation

## 2021-10-09 DIAGNOSIS — Z6831 Body mass index (BMI) 31.0-31.9, adult: Secondary | ICD-10-CM | POA: Insufficient documentation

## 2021-10-09 NOTE — Progress Notes (Signed)
Patient wanted a referral for a CT scan . Patient has no other concerns to talk about today  ?

## 2021-10-10 ENCOUNTER — Encounter: Payer: Self-pay | Admitting: Family Medicine

## 2021-10-10 NOTE — Progress Notes (Signed)
? ?Established Patient Office Visit ? ?Subjective:  ?Patient ID: Saleem Coccia, male    DOB: 1986/05/06  Age: 36 y.o. MRN: [id number] ? ?CC:  ?Chief Complaint  ?Patient presents with  ? referral  ? ? ?HPI ?Robbie Bara presents for follow up of depression and to discuss neck pain. Patient would like further imaging of neck as he has an appt scheduled with neurosurgery regarding his sx in the next 3 weeks.  ? ?Past Medical History:  ?Diagnosis Date  ? Ankylosing spondylitis (HCC)   ? Anxiety   ? Arthritis   ? Phreesia 12/05/2019  ? BPH (benign prostatic hyperplasia)   ? Cauda equina syndrome (HCC)   ? Chronic pain   ? DDD (degenerative disc disease), cervical   ? DDD (degenerative disc disease), lumbar   ? Depression   ? Phreesia 12/05/2019  ? Enlarged prostate   ? Hyperlipidemia   ? Hypertension   ? pt denies  ? Neuromuscular disorder (HCC)   ? Phreesia 12/05/2019  ? Pneumonia   ? x 2 as a child, 1 x as an adult  ? Post laminectomy syndrome   ? Sarcoidosis   ? Spinal stenosis   ? ? ?Past Surgical History:  ?Procedure Laterality Date  ? ANAL RECTAL MANOMETRY N/A 01/17/2021  ? Procedure: ANO RECTAL MANOMETRY;  Surgeon: Benancio Deeds, MD;  Location: [facility];  Service: Gastroenterology;  Laterality: N/A;  ? BRONCHIAL WASHINGS  06/13/2020  ? Procedure: BRONCHIAL WASHINGS;  Surgeon: Leslye Peer, MD;  Location: [facility];  Service: Pulmonary;;  ? CERVICAL FUSION    ? FINE NEEDLE ASPIRATION  06/13/2020  ? Procedure: FINE NEEDLE ASPIRATION (FNA) LINEAR;  Surgeon: Leslye Peer, MD;  Location: [facility];  Service: Pulmonary;;  ? IR FL GUIDED LOC OF NEEDLE/CATH TIP FOR SPINAL INJECTION LT    ? has had at least 10 of these  ? LAMINECTOMY AND MICRODISCECTOMY LUMBAR SPINE    ? SPINAL CORD STIMULATOR IMPLANT  ?2018  ? SPINAL FUSION    ? SPINE SURGERY N/A   ? Phreesia 12/05/2019  ? VIDEO BRONCHOSCOPY WITH ENDOBRONCHIAL ULTRASOUND Bilateral 06/13/2020  ? Procedure: VIDEO BRONCHOSCOPY WITH ENDOBRONCHIAL ULTRASOUND;   Surgeon: Leslye Peer, MD;  Location: [facility];  Service: Pulmonary;  Laterality: Bilateral;  ? ? ?Family History  ?Problem Relation Age of Onset  ? Depression Mother   ? Breast cancer Mother   ? Depression Father   ? Thyroid cancer Father   ? Depression Sister   ? Depression Brother   ? Hypertension Maternal Grandmother   ? Hyperlipidemia Maternal Grandmother   ? Heart disease Maternal Grandmother   ? Atrial fibrillation Maternal Grandmother   ? Diabetes Maternal Grandmother   ? Heart attack Maternal Grandfather   ? ? ?Social History  ? ?Socioeconomic History  ? Marital status: Married  ?  Spouse name: Rosann Auerbach [place]asc[place]  ? Number of children: 0  ? Years of education: Not on file  ? Highest education level: Not on file  ?Occupational History  ? Occupation: disabled  ?Tobacco Use  ? Smoking status: Former  ?  Packs/day: 0.50  ?  Years: 5.00  ?  Pack years: 2.50  ?  Types: Cigarettes  ?  Quit date: 04/20/2014  ?  Years since quitting: 7.4  ? Smokeless tobacco: Never  ?Vaping Use  ? Vaping Use: Never used  ?Substance and Sexual Activity  ? Alcohol use: Yes  ?  Alcohol/week: 2.0 standard drinks  ?  Types:  2 Standard drinks or equivalent per week  ?  Comment: 1 beer a week  ? Drug use: Never  ? Sexual activity: Not on file  ?Other Topics Concern  ? Not on file  ?Social History Narrative  ? Right Handed   ? Lives in a two story home   ? ?Social Determinants of Health  ? ?Financial Resource Strain: Low Risk   ? Difficulty of Paying Living Expenses: Not hard at all  ?Food Insecurity: No Food Insecurity  ? Worried About Programme researcher, broadcasting/film/video in the Last Year: Never true  ? Ran Out of Food in the Last Year: Never true  ?Transportation Needs: No Transportation Needs  ? Lack of Transportation (Medical): No  ? Lack of Transportation (Non-Medical): No  ?Physical Activity: Inactive  ? Days of Exercise per Week: 0 days  ? Minutes of Exercise per Session: 0 min  ?Stress: No Stress Concern Present  ? Feeling of Stress : Not at all   ?Social Connections: Unknown  ? Frequency of Communication with Friends and Family: Twice a week  ? Frequency of Social Gatherings with Friends and Family: Once a week  ? Attends Religious Services: Patient refused  ? Active Member of Clubs or Organizations: No  ? Attends Banker Meetings: Never  ? Marital Status: Married  ?Intimate Partner Violence: Not At Risk  ? Fear of Current or Ex-Partner: No  ? Emotionally Abused: No  ? Physically Abused: No  ? Sexually Abused: No  ? ? ?ROS ?Review of Systems  ?Musculoskeletal:  Positive for neck pain.  ?Psychiatric/Behavioral:  Positive for sleep disturbance. Negative for self-injury and suicidal ideas.   ?All other systems reviewed and are negative. ? ?Objective:  ? ?Today's Vitals: BP 114/79   Pulse 76   Temp 98.5 ?F (36.9 ?C) (Oral)   Resp 16   Wt 212 lb (96.2 kg)   SpO2 96%   BMI 30.42 kg/m?  ? ?Physical Exam ?Vitals and nursing note reviewed.  ?Constitutional:   ?   General: He is not in acute distress. ?Cardiovascular:  ?   Rate and Rhythm: Normal rate and regular rhythm.  ?Pulmonary:  ?   Effort: Pulmonary effort is normal.  ?   Breath sounds: Normal breath sounds.  ?Musculoskeletal:  ?   Cervical back: Neck supple. Pain with movement present. Decreased range of motion.  ?Skin: ?   Comments: Utilizing cane  ?Neurological:  ?   General: No focal deficit present.  ?   Mental Status: He is alert and oriented to person, place, and time.  ? ? ?Assessment & Plan:  ? ?1. DDD (degenerative disc disease), cervical ?Patient has a recent MR of cervical (08/22). Keep scheduled appt with consultant for further eval/mgt ? ?2. Depression, unspecified depression type ?Appears stable - will continue present management and monitor ? ?Outpatient Encounter Medications as of 10/09/2021  ?Medication Sig  ? escitalopram (LEXAPRO) 20 MG tablet Take 1 tablet (20 mg total) by mouth in the morning. Dx: Ata.Sales.A  ? folic acid (FOLVITE) 1 MG tablet TAKE 1 TABLET BY MOUTH EVERY DAY  FOR 30 DAYS  ? gabapentin (NEURONTIN) 600 MG tablet Take 600 mg by mouth 3 (three) times daily. Take two tablets 3 times daily  ? inFLIXimab-abda (RENFLEXIS) 100 MG SOLR Once every 6 weeks infusion  ? loratadine (CLARITIN) 10 MG tablet Take 10 mg by mouth at bedtime as needed for allergies.  ? Melatonin 10 MG TABS Take 10 mg by mouth at  bedtime.  ? methimazole (TAPAZOLE) 5 MG tablet Take 1 tablet (5 mg total) by mouth daily.  ? methotrexate (RHEUMATREX) 2.5 MG tablet Take 20 mg by mouth once a week.  ? morphine (MS CONTIN) 15 MG 12 hr tablet Take 15 mg by mouth 2 (two) times daily.   ? oxyCODONE (OXY IR/ROXICODONE) 5 MG immediate release tablet Take 5 mg by mouth in the morning, at noon, and at bedtime.   ? tamsulosin (FLOMAX) 0.4 MG CAPS capsule TAKE 1 CAPSULE BY MOUTH EVERYDAY AT BEDTIME  ? tiZANidine (ZANAFLEX) 2 MG tablet Take 1 tablet (2 mg total) by mouth 3 (three) times daily as needed for muscle spasms.  ? UNABLE TO FIND Med Name: conception xr once daily  ? metoprolol succinate (TOPROL-XL) 25 MG 24 hr tablet Take 1 tablet (25 mg total) by mouth daily. (Patient not taking: Reported on 10/09/2021)  ? ?No facility-administered encounter medications on file as of 10/09/2021.  ? ? ?Follow-up: No follow-ups on file.  ? ?Tommie RaymondWilson, Latif Nazareno P, MD ? ?

## 2021-10-23 ENCOUNTER — Other Ambulatory Visit: Payer: Self-pay | Admitting: Neurosurgery

## 2021-10-23 ENCOUNTER — Other Ambulatory Visit (HOSPITAL_COMMUNITY): Payer: Self-pay | Admitting: Neurosurgery

## 2021-10-23 DIAGNOSIS — G959 Disease of spinal cord, unspecified: Secondary | ICD-10-CM

## 2021-11-13 ENCOUNTER — Ambulatory Visit (HOSPITAL_COMMUNITY)
Admission: RE | Admit: 2021-11-13 | Discharge: 2021-11-13 | Disposition: A | Discharge status: Home / Self Care | Payer: Medicare Other | Source: Ambulatory Visit | Attending: Neurosurgery | Admitting: Neurosurgery

## 2021-11-13 DIAGNOSIS — G959 Disease of spinal cord, unspecified: Secondary | ICD-10-CM | POA: Diagnosis present

## 2021-11-19 ENCOUNTER — Telehealth: Payer: Self-pay | Admitting: Gastroenterology

## 2021-11-19 NOTE — Telephone Encounter (Signed)
Patient called thinks he has Internal prolapse. Patient also feels a lump. Please call to advise. ?

## 2021-11-19 NOTE — Telephone Encounter (Signed)
Returned call to patient. He reports that he is not sure if he has a rectal prolapse or hemorrhoids. I told pt that he will need to come in for an examination to determine what the "lump" is. I offered pt an appt tomorrow but he states that he can't make that appt because he is moving into a house. I scheduled pt for a f/u on Monday, 11/26/21 at 3:40 pm with Dr. Adela Lank. Pt has been advised to not do any heavy lifting during the move tomorrow, he states that he has hired movers. Pt will go to an urgent care in the interim if he has worsening symptoms or increased pain. Pt had no concerns at the end of the call. ?

## 2021-11-26 ENCOUNTER — Encounter: Payer: Self-pay | Admitting: Gastroenterology

## 2021-11-26 ENCOUNTER — Ambulatory Visit (INDEPENDENT_AMBULATORY_CARE_PROVIDER_SITE_OTHER): Payer: Medicare Other | Admitting: Gastroenterology

## 2021-11-26 VITALS — BP 130/80 | HR 87 | Ht 70.0 in | Wt 211.0 lb

## 2021-11-26 DIAGNOSIS — K602 Anal fissure, unspecified: Secondary | ICD-10-CM | POA: Diagnosis not present

## 2021-11-26 DIAGNOSIS — K6289 Other specified diseases of anus and rectum: Secondary | ICD-10-CM | POA: Diagnosis not present

## 2021-11-26 DIAGNOSIS — K5902 Outlet dysfunction constipation: Secondary | ICD-10-CM

## 2021-11-26 DIAGNOSIS — K5900 Constipation, unspecified: Secondary | ICD-10-CM

## 2021-11-26 MED ORDER — CITRUCEL PO POWD: 1.0000 | Freq: Every day | ORAL | Status: DC

## 2021-11-26 NOTE — Progress Notes (Signed)
HPI :  36 year old male with a history of cauda equina syndrome initially diagnosed 7 years ago s/p spinal surgery x 2, ankylosing spondylitis on Methotrexate and Renflexis and sarcoidosis which was diagnosed 06/2020. He is here for follow up for chronic constipation, rectal pain.  He was previously seen in the office in June 2022 for chronic constipation, rectal bleeding, rectal pain.  He underwent a colonoscopy with me that month which was normal, other than an anal fissure.  I had recommended some topical diltiazem ointment with lidocaine to treat the fissure.  He used this and states it helped quite a bit at the time.  I then referred him for anorectal manometry which suggested pelvic floor dyssynergia.  Based on this result I offered him a referral to pelvic floor PT to see if biofeedback therapy would provide any benefit, understanding that he likely has some neurologic damage from his cauda equina.  He did not follow-up with that recommendation.  He is continue to have some ongoing difficulty with constipation.  He typically continues to use manual maneuvers to produce a bowel movement usually once daily.  The stools are often hard.  If the stools are loose he has a hard time sensing this and at risk for leakage.  Last week he developed severe rectal discomfort and he could not get comfortable.  The pain was severe for some point time, used some lidocaine cream at home which helped a little bit, used some older diltiazem ointment he had which helped.  He states he felt something abnormal when trying to get stool out of his rectum, felt like he appreciated a palpable tissue that was new for him.  With time over the past several days his pain has gotten better but he still struggles with constipation, concerned about this pain he recently had options to treat his condition moving forward.  He is also concerned about possible palpable tissue he is feeling when he tries to manually facilitate a bowel  movement..  Recall he is followed by pain management for chronic pain, has a spinal cord stimulator implant.  He is on chronic narcotics.  He is afraid of having loose stools as he does not think he would be able to deal with that as easily and it takes him a very long time to clean himself when he has soft or loose stool.  Colonoscopy 12/22/20: An anal fissure was found on perianal exam in the posterior midline anal canal. - The terminal ileum appeared normal. - Internal hemorrhoids were found during retroflexion. - The exam was otherwise without abnormality.  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   Past Medical History:  Diagnosis Date   Ankylosing spondylitis (HCC)    Anxiety    Arthritis    Phreesia 12/05/2019   BPH (benign prostatic hyperplasia)    Cauda equina syndrome (HCC)    Chronic pain    DDD (degenerative disc disease), cervical    DDD (degenerative disc disease), lumbar    Depression    Phreesia 12/05/2019   Enlarged prostate    Hyperlipidemia    Hypertension    pt denies   Neuromuscular disorder (HCC)    Phreesia 12/05/2019   Pneumonia    x 2 as a child, 1 x as an adult   Post laminectomy syndrome    Sarcoidosis    Spinal stenosis      Past Surgical History:  Procedure Laterality Date   ANAL RECTAL MANOMETRY N/A 01/17/2021   Procedure:  ANO RECTAL MANOMETRY;  Surgeon: Benancio Deeds, MD;  Location: [facility];  Service: Gastroenterology;  Laterality: N/A;   BRONCHIAL WASHINGS  06/13/2020   Procedure: BRONCHIAL WASHINGS;  Surgeon: Leslye Peer, MD;  Location: [facility];  Service: Pulmonary;;   CERVICAL FUSION     FINE NEEDLE ASPIRATION  06/13/2020   Procedure: FINE NEEDLE ASPIRATION (FNA) LINEAR;  Surgeon: Leslye Peer, MD;  Location: [facility];  Service: Pulmonary;;   IR FL GUIDED LOC OF NEEDLE/CATH TIP FOR SPINAL INJECTION LT     has had at least 10 of these   LAMINECTOMY AND MICRODISCECTOMY LUMBAR SPINE     SPINAL CORD  STIMULATOR IMPLANT  ?2018   SPINAL FUSION     SPINE SURGERY N/A    Phreesia 12/05/2019   VIDEO BRONCHOSCOPY WITH ENDOBRONCHIAL ULTRASOUND Bilateral 06/13/2020   Procedure: VIDEO BRONCHOSCOPY WITH ENDOBRONCHIAL ULTRASOUND;  Surgeon: Leslye Peer, MD;  Location: [facility] ENDOSCOPY;  Service: Pulmonary;  Laterality: Bilateral;   Family History  Problem Relation Age of Onset   Depression Mother    Breast cancer Mother    Depression Father    Thyroid cancer Father    Depression Sister    Depression Brother    Hypertension Maternal Grandmother    Hyperlipidemia Maternal Grandmother    Heart disease Maternal Grandmother    Atrial fibrillation Maternal Grandmother    Diabetes Maternal Grandmother    Heart attack Maternal Grandfather    Social History   Tobacco Use   Smoking status: Former    Packs/day: 0.50    Years: 5.00    Pack years: 2.50    Types: Cigarettes    Quit date: 04/20/2014    Years since quitting: 7.6   Smokeless tobacco: Never  Vaping Use   Vaping Use: Never used  Substance Use Topics   Alcohol use: Yes    Alcohol/week: 2.0 standard drinks    Types: 2 Standard drinks or equivalent per week    Comment: 1 beer a week   Drug use: Never   Current Outpatient Medications  Medication Sig Dispense Refill   escitalopram (LEXAPRO) 20 MG tablet Take 1 tablet (20 mg total) by mouth in the morning. Dx: Ata.Sales.A 90 tablet 1   folic acid (FOLVITE) 1 MG tablet TAKE 1 TABLET BY MOUTH EVERY DAY FOR 30 DAYS     gabapentin (NEURONTIN) 600 MG tablet Take 600 mg by mouth 3 (three) times daily. Take two tablets 3 times daily     inFLIXimab-abda (RENFLEXIS) 100 MG SOLR Once every 6 weeks infusion     loratadine (CLARITIN) 10 MG tablet Take 10 mg by mouth at bedtime as needed for allergies.     Melatonin 10 MG TABS Take 10 mg by mouth at bedtime.     methimazole (TAPAZOLE) 5 MG tablet Take 1 tablet (5 mg total) by mouth daily. 90 tablet 3   methotrexate (RHEUMATREX) 2.5 MG tablet Take 20 mg  by mouth once a week.     methylcellulose (CITRUCEL) oral powder Take 1 packet by mouth daily.     metoprolol succinate (TOPROL-XL) 25 MG 24 hr tablet Take 1 tablet (25 mg total) by mouth daily. 90 tablet 0   morphine (MS CONTIN) 15 MG 12 hr tablet Take 15 mg by mouth 2 (two) times daily.      oxyCODONE (OXY IR/ROXICODONE) 5 MG immediate release tablet Take 5 mg by mouth in the morning, at noon, and at bedtime.      tamsulosin (  FLOMAX) 0.4 MG CAPS capsule TAKE 1 CAPSULE BY MOUTH EVERYDAY AT BEDTIME 90 capsule 0   tiZANidine (ZANAFLEX) 2 MG tablet Take 1 tablet (2 mg total) by mouth 3 (three) times daily as needed for muscle spasms. 30 tablet 0   UNABLE TO FIND Med Name: conception xr once daily     No current facility-administered medications for this visit.   Allergies  Allergen Reactions   Strawberry (Diagnostic) Anaphylaxis   Other Hives    Cat Dander     Review of Systems: All systems reviewed and negative except where noted in HPI.   Lab Results  Component Value Date   WBC 5.9 05/16/2021   HGB 14.4 05/16/2021   HCT 42.3 05/16/2021   MCV 87 05/16/2021   PLT 258 05/16/2021    Lab Results  Component Value Date   CREATININE 0.66 (L) 05/16/2021   BUN 14 05/16/2021   NA 138 05/16/2021   K 4.3 05/16/2021   CL 104 05/16/2021   CO2 22 05/16/2021    Lab Results  Component Value Date   ALT 22 05/16/2021   AST 19 05/16/2021   ALKPHOS 49 05/16/2021   BILITOT 0.6 05/16/2021     Physical Exam: BP 130/80   Pulse 87   Ht  (1.778 m)   Wt 211 lb (95.7 kg)   BMI 30.28 kg/m  Constitutional: Pleasant,well-developed, male in no acute distress. DRE - Alonna Buckler CMA as standby - anal fissure left lateral anal canal, posterior of midline side, no mass lesions appreciated, no fecal impaction Psychiatric: Normal mood and affect. Behavior is normal.   ASSESSMENT AND PLAN: 35 year old male here for reassessment of the following:  Anal fissure Rectal  pain Constipation Dyssynergic defecation  As above, chronic constipation in the setting of history of cauda equina syndrome and narcotic use.  His colonoscopy did not show any concerning pathology other than an anal fissure which accounted for his pain and bleeding at the time.  Continues to use manual maneuvers to help relieve himself of stool.  Acute severe pain last week which has improved over time, he reports palpable tissue on DRE that is abnormal for him.  On exam he has recurrent fissure as described above, potentially could have caused his pain recently but his symptoms seem much more dramatic than from pain related to a fissure.  I do not appreciate any palpable abnormal tissue or impacted stool on DRE today.  I reassured him of his recent colonoscopy findings which did not not show any concerning pathology.  It is possible he had otherwise buildup of stool causing possible partial impaction with stercoral ulceration or rectal spasm causing his recent symptoms, versus the fissure as above.  He is definitely doing better since this episode.  Discussed options moving forward.  I do think he should try a few enemas to clear his rectum distally of any residual hard stool.  Recommend resuming diltiazem/lidocaine ointment PR 3 times daily for treatment of his fissure.  I do think he would benefit from softening his stool a bit but he does not want loose stool.  In this light we will try some Citrucel once daily.  History of cauda equina, he has some sensory loss in his rectum, unclear how much the dyssynergia noted on manometry would benefit from pelvic floor PT, but recommend he try it to see if biofeedback can help in any way.  He is agreeable to this.  Hopefully between these measures he feels better, if  not he will contact me for reassessment and discussion of other options.  Plan: - fleet enemas trial today - refill diltiazem lidocaine ointment - [facility] - pea sized amount TID x 4  weeks - trial of Citrucel once daily - referral to pelvic floor PT - f/u as needed or if symptoms persist.  Harlin Rain, MD [facility]

## 2021-11-26 NOTE — Patient Instructions (Addendum)
If you are age 37 or older, your body mass index should be between 23-30. Your Body mass index is 30.28 kg/m. If this is out of the aforementioned range listed, please consider follow up with your Primary Care Provider.  If you are age 23 or younger, your body mass index should be between 19-25. Your Body mass index is 30.28 kg/m. If this is out of the aformentioned range listed, please consider follow up with your Primary Care Provider.   ________________________________________________________  The [facility] providers would like to encourage you to use [facility] to communicate with providers for non-urgent requests or questions.  Due to long hold times on the telephone, sending your provider a message by [facility] may be a faster and more efficient way to get a response.  Please allow 48 business hours for a response.  Please remember that this is for non-urgent requests.  _______________________________________________________  We have sent a prescription for Diltiazem gel to [facility]. You should apply a pea size amount to your rectum three times daily x 4 weeks.  [facility]'s information is below: Address: [address] [place], [place] [address]  [phone]  *Please DO NOT go directly from our office to pick up this medication! Give the pharmacy 1 day to process the prescription as this is compounded and takes time to make.  _________________________________________________________  Please purchase the following medications over the counter and take as directed: Citrucel: Take once daily  We are referring you to Pelvic Floor Physical Therapy.  They will contact you directly to schedule an appointment.  It may take a week or more before you hear from them.  Please feel free to contact us if you have not heard from them within 2 weeks and we will follow up on the referral.   Please use 2 fleet enemas as directed for constipation.  Thank you for entrusting me  with your care and for choosing [facility], Dr. Ileene Patrick

## 2021-11-27 ENCOUNTER — Other Ambulatory Visit: Payer: Self-pay

## 2021-11-27 MED ORDER — AMBULATORY NON FORMULARY MEDICATION: 0 refills | Status: AC

## 2021-11-27 NOTE — Progress Notes (Signed)
Diltiazem sent to [facility] 

## 2021-11-28 ENCOUNTER — Encounter: Payer: Self-pay | Admitting: Physical Therapy

## 2021-11-28 ENCOUNTER — Ambulatory Visit: Payer: Medicare Other | Attending: Gastroenterology | Admitting: Physical Therapy

## 2021-11-28 DIAGNOSIS — R269 Unspecified abnormalities of gait and mobility: Secondary | ICD-10-CM | POA: Diagnosis not present

## 2021-11-28 DIAGNOSIS — R293 Abnormal posture: Secondary | ICD-10-CM | POA: Insufficient documentation

## 2021-11-28 DIAGNOSIS — R279 Unspecified lack of coordination: Secondary | ICD-10-CM | POA: Diagnosis not present

## 2021-11-28 DIAGNOSIS — M6281 Muscle weakness (generalized): Secondary | ICD-10-CM | POA: Diagnosis present

## 2021-11-28 DIAGNOSIS — M62838 Other muscle spasm: Secondary | ICD-10-CM | POA: Insufficient documentation

## 2021-11-28 NOTE — Therapy (Signed)
OUTPATIENT PHYSICAL THERAPY MALE PELVIC EVALUATION   Patient Name: Lawrence Cantu MRN: [id number] DOB:17-Oct-1985, 36 y.o., male Today's Date: 11/28/2021   PT End of Session - 11/28/21 1614     Visit Number 1    Date for PT Re-Evaluation 02/28/22    Authorization Type medicare A& B    PT Start Time 1615    PT Stop Time 1655    PT Time Calculation (min) 40 min    Behavior During Therapy [facility] for tasks assessed/performed             Past Medical History:  Diagnosis Date   Ankylosing spondylitis ([place])    Anxiety    Arthritis    Phreesia 12/05/2019   BPH (benign prostatic hyperplasia)    Cauda equina syndrome (HCC)    Chronic pain    DDD (degenerative disc disease), cervical    DDD (degenerative disc disease), lumbar    Depression    Phreesia 12/05/2019   Enlarged prostate    Hyperlipidemia    Hypertension    pt denies   Neuromuscular disorder ([place])    Phreesia 12/05/2019   Pneumonia    x 2 as a child, 1 x as an adult   Post laminectomy syndrome    Sarcoidosis    Spinal stenosis    Past Surgical History:  Procedure Laterality Date   ANAL RECTAL MANOMETRY N/A 01/17/2021   Procedure: ANO RECTAL MANOMETRY;  Surgeon: Yetta Flock, MD;  Location: [facility];  Service: Gastroenterology;  Laterality: N/A;   BRONCHIAL WASHINGS  06/13/2020   Procedure: BRONCHIAL WASHINGS;  Surgeon: Collene Gobble, MD;  Location: [facility];  Service: Pulmonary;;   CERVICAL FUSION     FINE NEEDLE ASPIRATION  06/13/2020   Procedure: FINE NEEDLE ASPIRATION (FNA) LINEAR;  Surgeon: Collene Gobble, MD;  Location: [facility];  Service: Pulmonary;;   IR FL GUIDED LOC OF NEEDLE/CATH TIP FOR SPINAL INJECTION LT     has had at least 10 of these   [place]  ?2018   SPINAL FUSION     SPINE SURGERY N/A    Phreesia 12/05/2019   VIDEO BRONCHOSCOPY WITH ENDOBRONCHIAL ULTRASOUND Bilateral 06/13/2020   Procedure: VIDEO  BRONCHOSCOPY WITH ENDOBRONCHIAL ULTRASOUND;  Surgeon: Collene Gobble, MD;  Location: [facility] ENDOSCOPY;  Service: Pulmonary;  Laterality: Bilateral;   Patient Active Problem List   Diagnosis Date Noted   Body mass index (BMI) 31.0-31.9, adult 10/09/2021   Body mass index (BMI) 34.0-34.9, adult 10/09/2021   Overweight 10/09/2021   Hyperthyroidism 07/18/2021   Depression 05/17/2021   Constipation due to outlet dysfunction    Dyssynergic defecation    Rectal abnormality    Suspected sleep apnea 12/19/2020   Sacroiliitis, not elsewhere classified ([place]) 12/05/2020   Obesity 12/05/2020   DDD (degenerative disc disease), cervical 12/05/2020   Spinal cord stimulator status 09/25/2020   Dyspnea 04/20/2020   Sarcoidosis of lung ([place]) 04/20/2020   Ankylosing spondylitis lumbar region ([place]) 01/04/2020   Thoracic spine pain 01/04/2020   HLA B27 positive 12/27/2019   Cervical radiculitis 12/18/2019   Chronic pain syndrome 12/18/2019   Neck pain 12/17/2019   Cervical herniated disc 10/07/2016    PCP: Dorna Mai, MD  REFERRING PROVIDER: Yetta Flock, MD  REFERRING DIAG: K60.2 (ICD-10-CM) - Anal fissure K59.00 (ICD-10-CM) - Constipation, unspecified constipation type K62.89 (ICD-10-CM) - Rectal pain K59.02 (ICD-10-CM) - Dyssynergic defecation  THERAPY DIAG:  Muscle weakness (  generalized)  Abnormality of gait and mobility  Unspecified lack of coordination  Abnormal posture  Other muscle spasm  Rationale for Evaluation and Treatment Rehabilitation  ONSET DATE: 7 years  SUBJECTIVE:                                                                                                                                                                                           SUBJECTIVE STATEMENT: 7 years ago, emergency spinal surgery due to cauda equina syndrome, has sense had multiple other back surgeries, does have a spinal stimulator. Pt reports he has poor sensation and has  difficulty pushing to have a bowel movement and been instructed to do digital stimulation to have bowel movements. Pt reports he started feeling rectal pain, MD did internal and reports he did not anything other than anal fissure.  Fluid intake: 32oz a couple times per day Patient confirms identification and approves PT to assess pelvic floor and treatment Yes   PAIN:  Are you having pain? Yes NPRS scale: 7/10 fairly regularly Pain location:  back pain with ankylosing spondylitis  Pain type: aching and throbbing Pain description: constant   Aggravating factors: lifting, prolonged sitting Relieving factors: mobility   PRECAUTIONS: None  WEIGHT BEARING RESTRICTIONS No  FALLS:  Has patient fallen in last 6 months? No  LIVING ENVIRONMENT: Lives with: lives with their family Lives in: House/apartment   OCCUPATION: does not work  PLOF: Independent  PATIENT GOALS to have more regular bowel movements   PERTINENT HISTORY:  cauda equina syndrome initially diagnosed 7 years ago s/p spinal surgery x 2, ankylosing spondylitis on Methotrexate and Renflexis and sarcoidosis which was diagnosed 06/2020. He is here for follow up for chronic constipation, rectal pain. Sexual abuse: NO  BOWEL MOVEMENT Pain with bowel movement: No Type of bowel movement:Type (Bristol Stool Scale) 2-3, Frequency 1-2x per day, Strain Yes, and Splinting digital stimulation Fully empty rectum: No Leakage: No Pads: No Fiber supplement: Yes: citrucel   URINATION Pain with urination: No Fully empty bladder: No and does manually assist from base of penis to end to finish emptying  Stream: Strong Urgency: No Frequency: 2x per night with strong urge, about every 2 hours. Does report on days with more pain will go more often and smaller amounts Leakage:  no  Pads: No  INTERCOURSE Pain with intercourse:  no pain  Climax: no pain  Ejaculation: Yes: does have to assist to finish emptying same as urination       OBJECTIVE:   DIAGNOSTIC FINDINGS:    COGNITION:  Overall cognitive status: Within functional limits for tasks assessed  SENSATION:  Light touch: Deficits at pelvic region mostly at rectum   Proprioception: Appears intact  MUSCLE LENGTH: Bil hamstrings and adductors limited by 50%    FUNCTIONAL TESTS:  Unable to do functional squat due to pain and restrictions in mobility   GAIT: Distance walked: 300' Assistive device utilized: Single point cane Level of assistance: Modified independence Comments: decreased cadence and decreased bil step height, mildly forward flexed posture  POSTURE:  Rounded shoulders, posterior pelvic tilt  LUMBARAROM/PROM  A/PROM A/PROM  eval  Flexion Limited by 75%  Extension Limited by 25%  Right lateral flexion Limited by 75%  Left lateral flexion Limited by 75%  Right rotation Limited by 50%  Left rotation Limited by 50%   (Blank rows = not tested)  LOWER EXTREMITY AROM/PROM:  [facility]  LOWER EXTREMITY MMT:  Bil hips 4/5; bil knees and ankles 5/5  PELVIC MMT: deferred as MD just did this but agreeable if needed   MMT eval  Internal Anal Sphincter   External Anal Sphincter   Puborectalis   Diastasis Recti   (Blank rows = not tested)  PALPATION: GENERAL no TTP but did have moderate fascial restrictions throughout abdomen               External Perineal Exam deferred               Internal Pelvic Floor deferred  TONE: Deferred   TODAY'S TREATMENT  11/28/2021 EVAL Examination completed, findings reviewed, pt educated on POC, HEP, and handout on abdominal massage and squatty potty mechanics for voiding. Pt motivated to participate in PT and agreeable to attempt recommendations.     PATIENT EDUCATION:  Education details: [place] Person educated: Patient Education method: Explanation, Demonstration, Tactile cues, Verbal cues, and Handouts Education comprehension: verbalized understanding and returned  demonstration   HOME EXERCISE PROGRAM: [id number]  ASSESSMENT:  CLINICAL IMPRESSION: Patient is a 36 y.o. male  who was seen today for physical therapy evaluation and treatment for constipation, anal fissure, and dyssynergic defecation. Pt has h/o cauda equina syndrome initially diagnosed 7 years ago s/p spinal surgery x 2, ankylosing spondylitis and sarcoidosis which was diagnosed 06/2020. Pt reports he has decreased sensation at rectum and has a hard time feeling when he is pushing out stool, does need to do digital stimulation to have bowel movement most of the time but does not do this regularly. Pt also reports he almost never feels complete with BM and also does need take something regularly to move bowels such as colace or citrucel. Pt reports he prefers bowels to be harder as these are easier to move with digital stimulation compared to softer stools. Pt reports he feels like he can't relax to allow any stool out but also has deficits with sensation so this limits him.  Pt also has chronic pain and limitations in mobility after more than one spinal surgery and ankylosing spondylitis. Pt found to have moderate limitations in flexibility in spine and bil hips, decreased core strength, abdominal fascial restrictions.  Pt would benefit from additional PT to further address deficits.     OBJECTIVE IMPAIRMENTS decreased coordination, decreased endurance, decreased mobility, decreased strength, increased fascial restrictions, increased muscle spasms, impaired flexibility, impaired tone, improper body mechanics, postural dysfunction, and pain.   ACTIVITY LIMITATIONS carrying, lifting, bending, sitting, standing, squatting, stairs, and continence  PARTICIPATION LIMITATIONS: community activity  PERSONAL FACTORS Time since onset of injury/illness/exacerbation and 3+ comorbidities: complex medical history  are also affecting patient's functional outcome.  REHAB POTENTIAL: Good  CLINICAL DECISION  MAKING: Stable/uncomplicated  EVALUATION COMPLEXITY: Low   GOALS: Goals reviewed with patient? Yes  SHORT TERM GOALS: Target date: 12/26/2021   Pt to be I with HEP.  Baseline: Goal status: INITIAL  2.  Pt will report BMs are complete  at least 50% of the time due to improved bowel habits and evacuation techniques.  Baseline:  Goal status: INITIAL  3.  Pt to demonstrate improved coordination of pelvic floor and breathing mechanics to allow full relaxation of abdomen and pelvic floor then full contraction and full relaxation to improve mobility and bowel regularity without pain at least 50% of the time.  Baseline: unable  Goal status: INITIAL  4.  Pt to be I and have good recall with abdominal massage and voiding and breathing mechanics for improved bowel habits.  Baseline:  Goal status: INITIAL   LONG TERM GOALS: Target date: 02/28/2022   Pt to be I with advanced HEP.  Baseline:  Goal status: INITIAL  2.  Pt to demonstrate improved coordination of pelvic floor and breathing mechanics to allow full relaxation of abdomen and pelvic floor then full contraction and full relaxation to improve mobility and bowel regularity without pain at least 75% of the time.  Baseline:  Goal status: INITIAL  3.  Pt will report her BMs are complete at least 75% of the time due to improved bowel habits and evacuation techniques.  Baseline:  Goal status: INITIAL    PLAN: PT FREQUENCY: 1x/week  PT DURATION:  10 sessions  PLANNED INTERVENTIONS: Therapeutic exercises, Therapeutic activity, Neuromuscular re-education, Patient/Family education, Joint mobilization, Aquatic Therapy, Dry Needling, Spinal mobilization, Cryotherapy, Moist heat, Manual lymph drainage, scar mobilization, Taping, Biofeedback, and Manual therapy  PLAN FOR NEXT SESSION: gentle stretching, breathing and voiding mechanics review, balloon breathing technique, pelvic relaxation techniques/manual  Stacy Gardner, PT,  DPT 05/24/235:12 PM

## 2021-12-05 ENCOUNTER — Ambulatory Visit: Payer: Medicare Other | Admitting: Physical Therapy

## 2021-12-05 DIAGNOSIS — M6281 Muscle weakness (generalized): Secondary | ICD-10-CM | POA: Diagnosis not present

## 2021-12-05 DIAGNOSIS — R279 Unspecified lack of coordination: Secondary | ICD-10-CM

## 2021-12-05 DIAGNOSIS — R269 Unspecified abnormalities of gait and mobility: Secondary | ICD-10-CM

## 2021-12-05 NOTE — Therapy (Signed)
OUTPATIENT PHYSICAL THERAPY Treatment   Patient Name: Lawrence Cantu MRN: [id number] DOB:[birth year], 36 y.o., male Today's Date: 12/05/2021   PT End of Session - 12/05/21 1612     Visit Number 2    Date for PT Re-Evaluation 02/28/22    Authorization Type medicare A& B    PT Start Time 1615    PT Stop Time 1655    PT Time Calculation (min) 40 min    Activity Tolerance Patient tolerated treatment well    Behavior During Therapy [facility] for tasks assessed/performed             Past Medical History:  Diagnosis Date   Ankylosing spondylitis ([place])    Anxiety    Arthritis    Phreesia 12/05/2019   BPH (benign prostatic hyperplasia)    Cauda equina syndrome (HCC)    Chronic pain    DDD (degenerative disc disease), cervical    DDD (degenerative disc disease), lumbar    Depression    Phreesia 12/05/2019   Enlarged prostate    Hyperlipidemia    Hypertension    pt denies   Neuromuscular disorder ([place])    Phreesia 12/05/2019   Pneumonia    x 2 as a child, 1 x as an adult   Post laminectomy syndrome    Sarcoidosis    Spinal stenosis    Past Surgical History:  Procedure Laterality Date   ANAL RECTAL MANOMETRY N/A 01/17/2021   Procedure: ANO RECTAL MANOMETRY;  Surgeon: Yetta Flock, MD;  Location: [facility];  Service: Gastroenterology;  Laterality: N/A;   BRONCHIAL WASHINGS  06/13/2020   Procedure: BRONCHIAL WASHINGS;  Surgeon: Collene Gobble, MD;  Location: [facility];  Service: Pulmonary;;   CERVICAL FUSION     FINE NEEDLE ASPIRATION  06/13/2020   Procedure: FINE NEEDLE ASPIRATION (FNA) LINEAR;  Surgeon: Collene Gobble, MD;  Location: [facility];  Service: Pulmonary;;   IR FL GUIDED LOC OF NEEDLE/CATH TIP FOR SPINAL INJECTION LT     has had at least 10 of these   [place]  ?2018   SPINAL FUSION     SPINE SURGERY N/A    Phreesia 12/05/2019   VIDEO BRONCHOSCOPY WITH ENDOBRONCHIAL ULTRASOUND  Bilateral 06/13/2020   Procedure: VIDEO BRONCHOSCOPY WITH ENDOBRONCHIAL ULTRASOUND;  Surgeon: Collene Gobble, MD;  [facility];  Service: Pulmonary;  Laterality: Bilateral;   Patient Active Problem List   Diagnosis Date Noted   Body mass index (BMI) 31.0-31.9, adult 10/09/2021   Body mass index (BMI) 34.0-34.9, adult 10/09/2021   Overweight 10/09/2021   Hyperthyroidism 07/18/2021   Depression 05/17/2021   Constipation due to outlet dysfunction    Dyssynergic defecation    Rectal abnormality    Suspected sleep apnea 12/19/2020   Sacroiliitis, not elsewhere classified ([place]) 12/05/2020   Obesity 12/05/2020   DDD (degenerative disc disease), cervical 12/05/2020   Spinal cord stimulator status 09/25/2020   Dyspnea 04/20/2020   Sarcoidosis of lung ([place]) 04/20/2020   Ankylosing spondylitis lumbar region ([place]) 01/04/2020   Thoracic spine pain 01/04/2020   HLA B27 positive 12/27/2019   Cervical radiculitis 12/18/2019   Chronic pain syndrome 12/18/2019   Neck pain 12/17/2019   Cervical herniated disc 10/07/2016    PCP: Dorna Mai, MD  REFERRING PROVIDER: Yetta Flock, MD  REFERRING DIAG: K60.2 (ICD-10-CM) - Anal fissure K59.00 (ICD-10-CM) - Constipation, unspecified constipation type K62.89 (ICD-10-CM) - Rectal pain K59.02 (ICD-10-CM) - Dyssynergic  defecation  THERAPY DIAG:  Muscle weakness (generalized)  Abnormality of gait and mobility  Unspecified lack of coordination  Rationale for Evaluation and Treatment Rehabilitation  ONSET DATE: 7 years  SUBJECTIVE:                                                                                                                                                                                           SUBJECTIVE STATEMENT: Pt reports he got a squatty potty, doing abdominal massage every morning, taking citrucel  Fluid intake: 32oz a couple times per day Patient confirms identification and approves PT to assess  pelvic floor and treatment Yes   PAIN:  Are you having pain? Yes NPRS scale: 7/10 fairly regularly Pain location:  back pain with ankylosing spondylitis  Pain type: aching and throbbing Pain description: constant   Aggravating factors: lifting, prolonged sitting Relieving factors: mobility   PRECAUTIONS: Other: cauda equina, spinal stimulator in place, ankylosing spondylitis   PERTINENT HISTORY:  cauda equina syndrome initially diagnosed 7 years ago s/p spinal surgery x 2, ankylosing spondylitis on Methotrexate and Renflexis and sarcoidosis which was diagnosed 06/2020. He is here for follow up for chronic constipation, rectal pain. Sexual abuse: NO  BOWEL MOVEMENT Pain with bowel movement: No Type of bowel movement:Type (Bristol Stool Scale) 2-3, Frequency 1-2x per day, Strain Yes, and Splinting digital stimulation Fully empty rectum: No Leakage: No Pads: No Fiber supplement: Yes: citrucel   URINATION Pain with urination: No Fully empty bladder: No and does manually assist from base of penis to end to finish emptying  Stream: Strong Urgency: No Frequency: 2x per night with strong urge, about every 2 hours. Does report on days with more pain will go more often and smaller amounts Leakage:  no  Pads: No  INTERCOURSE Pain with intercourse:  no pain  Climax: no pain  Ejaculation: Yes: does have to assist to finish emptying same as urination      OBJECTIVE:   DIAGNOSTIC FINDINGS:    COGNITION:  Overall cognitive status: Within functional limits for tasks assessed     SENSATION:  Light touch: Deficits at pelvic region mostly at rectum   Proprioception: Appears intact  MUSCLE LENGTH: Bil hamstrings and adductors limited by 50%    FUNCTIONAL TESTS:  Unable to do functional squat due to pain and restrictions in mobility   GAIT: Distance walked: 300' Assistive device utilized: Single point cane Level of assistance: Modified independence Comments: decreased  cadence and decreased bil step height, mildly forward flexed posture  POSTURE:  Rounded shoulders, posterior pelvic tilt  LUMBARAROM/PROM  A/PROM A/PROM  eval  Flexion Limited by 75%  Extension Limited by 25%  Right lateral flexion Limited by 75%  Left lateral flexion Limited by 75%  Right rotation Limited by 50%  Left rotation Limited by 50%   (Blank rows = not tested)  LOWER EXTREMITY AROM/PROM:  [facility]  LOWER EXTREMITY MMT:  Bil hips 4/5; bil knees and ankles 5/5  PELVIC MMT: deferred as MD just did this but agreeable if needed   MMT eval  Internal Anal Sphincter   External Anal Sphincter   Puborectalis   Diastasis Recti   (Blank rows = not tested)  PALPATION: GENERAL no TTP but did have moderate fascial restrictions throughout abdomen               External Perineal Exam deferred               Internal Pelvic Floor deferred  TONE: Deferred   TODAY'S TREATMENT   12/05/2021  Lower Lumbar rotation 2x10 2x10 pelvic tilts in hooklying  TA activations 2x10 in supine with exhale Diaphragmatic breathing in sitting 2x10  Green band row in sitting x20 Green band seated hip abduction x20 Green band seated bil shoulder horizontal abduction 2x10  X10 balloon breathing techniques while sitting at mat table with feet at squatty potty   11/28/2021 EVAL Examination completed, findings reviewed, pt educated on POC, HEP, and handout on abdominal massage and squatty potty mechanics for voiding. Pt motivated to participate in PT and agreeable to attempt recommendations.     PATIENT EDUCATION:  Education details: [place] Person educated: Patient Education method: Explanation, Demonstration, Tactile cues, Verbal cues, and Handouts Education comprehension: verbalized understanding and returned demonstration   HOME EXERCISE PROGRAM: [place]  ASSESSMENT:  CLINICAL IMPRESSION: Patient reports he has been moving into new house so fairly sore today with a lot of activity  the past week. Has been doing recommendations for bowel movements from PT and MD to see if these help. Has not seen a lot of changes yet. Pt session focused on core activations and breathing mechanics, voiding mechanics. Pt tolerated well did need extra time for repositioning and rest breaks with back pain. Pt would benefit from additional PT to further address deficits.     OBJECTIVE IMPAIRMENTS decreased coordination, decreased endurance, decreased mobility, decreased strength, increased fascial restrictions, increased muscle spasms, impaired flexibility, impaired tone, improper body mechanics, postural dysfunction, and pain.   ACTIVITY LIMITATIONS carrying, lifting, bending, sitting, standing, squatting, stairs, and continence  PARTICIPATION LIMITATIONS: community activity  PERSONAL FACTORS Time since onset of injury/illness/exacerbation and 3+ comorbidities: complex medical history  are also affecting patient's functional outcome.   REHAB POTENTIAL: Good  CLINICAL DECISION MAKING: Stable/uncomplicated  EVALUATION COMPLEXITY: Low   GOALS: Goals reviewed with patient? Yes  SHORT TERM GOALS: Target date: 12/26/2021   Pt to be I with HEP.  Baseline: Goal status: INITIAL  2.  Pt will report BMs are complete  at least 50% of the time due to improved bowel habits and evacuation techniques.  Baseline:  Goal status: INITIAL  3.  Pt to demonstrate improved coordination of pelvic floor and breathing mechanics to allow full relaxation of abdomen and pelvic floor then full contraction and full relaxation to improve mobility and bowel regularity without pain at least 50% of the time.  Baseline: unable  Goal status: INITIAL  4.  Pt to be I and have good recall with abdominal massage and voiding and breathing mechanics for improved bowel habits.  Baseline:  Goal status: INITIAL   LONG TERM GOALS: Target date:  02/28/2022   Pt to be I with advanced HEP.  Baseline:  Goal status:  INITIAL  2.  Pt to demonstrate improved coordination of pelvic floor and breathing mechanics to allow full relaxation of abdomen and pelvic floor then full contraction and full relaxation to improve mobility and bowel regularity without pain at least 75% of the time.  Baseline:  Goal status: INITIAL  3.  Pt will report her BMs are complete at least 75% of the time due to improved bowel habits and evacuation techniques.  Baseline:  Goal status: INITIAL    PLAN: PT FREQUENCY: 1x/week  PT DURATION:  10 sessions  PLANNED INTERVENTIONS: Therapeutic exercises, Therapeutic activity, Neuromuscular re-education, Patient/Family education, Joint mobilization, Aquatic Therapy, Dry Needling, Spinal mobilization, Cryotherapy, Moist heat, Manual lymph drainage, scar mobilization, Taping, Biofeedback, and Manual therapy  PLAN FOR NEXT SESSION: gentle stretching, breathing and voiding mechanics review, balloon breathing technique, pelvic relaxation techniques/manual  Stacy Gardner, PT, DPT 05/31/234:59 PM

## 2021-12-05 NOTE — Patient Instructions (Signed)

## 2021-12-10 ENCOUNTER — Ambulatory Visit: Payer: Medicare Other | Attending: Gastroenterology | Admitting: Physical Therapy

## 2021-12-10 DIAGNOSIS — R293 Abnormal posture: Secondary | ICD-10-CM | POA: Insufficient documentation

## 2021-12-10 DIAGNOSIS — M62838 Other muscle spasm: Secondary | ICD-10-CM | POA: Diagnosis present

## 2021-12-10 DIAGNOSIS — M6281 Muscle weakness (generalized): Secondary | ICD-10-CM | POA: Diagnosis present

## 2021-12-10 DIAGNOSIS — R279 Unspecified lack of coordination: Secondary | ICD-10-CM | POA: Insufficient documentation

## 2021-12-10 DIAGNOSIS — R269 Unspecified abnormalities of gait and mobility: Secondary | ICD-10-CM | POA: Insufficient documentation

## 2021-12-10 NOTE — Therapy (Signed)
OUTPATIENT PHYSICAL THERAPY Treatment   Patient Name: Lawrence Cantu MRN: [id number] DOB:1985-11-06, 36 y.o., male Today's Date: 12/10/2021   PT End of Session - 12/10/21 1257     Visit Number 3    Date for PT Re-Evaluation 02/28/22    Authorization Type medicare A& B    PT Start Time 54    PT Stop Time 1300   pt reports he understands HEP and would like to do these at home   PT Time Calculation (min) 30 min    Activity Tolerance Patient tolerated treatment well    Behavior During Therapy [facility] for tasks assessed/performed              Past Medical History:  Diagnosis Date   Ankylosing spondylitis ([place])    Anxiety    Arthritis    Phreesia 12/05/2019   BPH (benign prostatic hyperplasia)    Cauda equina syndrome (HCC)    Chronic pain    DDD (degenerative disc disease), cervical    DDD (degenerative disc disease), lumbar    Depression    Phreesia 12/05/2019   Enlarged prostate    Hyperlipidemia    Hypertension    pt denies   Neuromuscular disorder ([place])    Phreesia 12/05/2019   Pneumonia    x 2 as a child, 1 x as an adult   Post laminectomy syndrome    Sarcoidosis    Spinal stenosis    Past Surgical History:  Procedure Laterality Date   ANAL RECTAL MANOMETRY N/A 01/17/2021   Procedure: ANO RECTAL MANOMETRY;  Surgeon: Yetta Flock, MD;  [facility];  Service: Gastroenterology;  Laterality: N/A;   BRONCHIAL WASHINGS  06/13/2020   Procedure: BRONCHIAL WASHINGS;  Surgeon: Collene Gobble, MD;  [facility];  Service: Pulmonary;;   CERVICAL FUSION     FINE NEEDLE ASPIRATION  06/13/2020   Procedure: FINE NEEDLE ASPIRATION (FNA) LINEAR;  Surgeon: Collene Gobble, MD;  [facility];  Service: Pulmonary;;   IR FL GUIDED LOC OF NEEDLE/CATH TIP FOR SPINAL INJECTION LT     has had at least 10 of these   [place]  ?2018   SPINAL FUSION     SPINE SURGERY N/A     Phreesia 12/05/2019   VIDEO BRONCHOSCOPY WITH ENDOBRONCHIAL ULTRASOUND Bilateral 06/13/2020   Procedure: VIDEO BRONCHOSCOPY WITH ENDOBRONCHIAL ULTRASOUND;  Surgeon: Collene Gobble, MD;  Location[facility] END[facility]COPY;  Service: Pulmonary;  Laterality: Bilateral;   Patient Active Problem List   Diagnosis Date Noted   Body mass index (BMI) 31.0-31.9, adult 10/09/2021   Body mass index (BMI) 34.0-34.9, adult 10/09/2021   Overweight 10/09/2021   Hyperthyroidism 07/18/2021   Depression 05/17/2021   Constipation due to outlet dysfunction    Dyssynergic defecation    Rectal abnormality    Suspected sleep apnea 12/19/2020   Sacroiliitis, not elsewhere classified ([place]) 12/05/2020   Obesity 12/05/2020   DDD (degenerative disc disease), cervical 12/05/2020   Spinal cord stimulator status 09/25/2020   Dyspnea 04/20/2020   Sarcoidosis of lung ([place]) 04/20/2020   Ankylosing spondylitis lumbar region [facility]) 01/04/2020   Thoracic spine pain 01/04/2020   HLA B27 positive 12/27/2019   Cervical radiculitis 12/18/2019   Chronic pain syndrome 12/18/2019   Neck pain 12/17/2019   Cervical herniated disc 10/07/2016    PCP: Dorna Mai, MD  REFERRING PROVIDER: Yetta Flock, MD  REFERRING DIAG: K60.2 (ICD-10-CM) - Anal fissure K59.00 (  ICD-10-CM) - Constipation, unspecified constipation type K62.89 (ICD-10-CM) - Rectal pain K59.02 (ICD-10-CM) - Dyssynergic defecation  THERAPY DIAG:  Muscle weakness (generalized)  Abnormality of gait and mobility  Other muscle spasm  Unspecified lack of coordination  Rationale for Evaluation and Treatment Rehabilitation  ONSET DATE: 7 years  SUBJECTIVE:                                                                                                                                                                                           SUBJECTIVE STATEMENT: Pt reports he has been using squatty potty and breathing techniques and reports this was very  helpful, now having to strain less and more fully emptying. 25% of the time now having a full BM and feels complete.  Fluid intake: 32oz a couple times per day Patient confirms identification and approves PT to assess pelvic floor and treatment Yes   PAIN:  Are you having pain? Yes NPRS scale: 7/10 fairly regularly Pain location:  back pain with ankylosing spondylitis  Pain type: aching and throbbing Pain description: constant   Aggravating factors: lifting, prolonged sitting Relieving factors: mobility   PRECAUTIONS: Other: cauda equina, spinal stimulator in place, ankylosing spondylitis   PERTINENT HISTORY:  cauda equina syndrome initially diagnosed 7 years ago s/p spinal surgery x 2, ankylosing spondylitis on Methotrexate and Renflexis and sarcoidosis which was diagnosed 06/2020. He is here for follow up for chronic constipation, rectal pain. Sexual abuse: NO  BOWEL MOVEMENT Pain with bowel movement: No Type of bowel movement:Type (Bristol Stool Scale) 2-3, Frequency 1-2x per day, Strain Yes, and Splinting digital stimulation Fully empty rectum: No Leakage: No Pads: No Fiber supplement: Yes: citrucel   URINATION Pain with urination: No Fully empty bladder: No and does manually assist from base of penis to end to finish emptying  Stream: Strong Urgency: No Frequency: 2x per night with strong urge, about every 2 hours. Does report on days with more pain will go more often and smaller amounts Leakage:  no  Pads: No  INTERCOURSE Pain with intercourse:  no pain  Climax: no pain  Ejaculation: Yes: does have to assist to finish emptying same as urination      OBJECTIVE:   DIAGNOSTIC FINDINGS:    COGNITION:  Overall cognitive status: Within functional limits for tasks assessed     SENSATION:  Light touch: Deficits at pelvic region mostly at rectum   Proprioception: Appears intact  MUSCLE LENGTH: Bil hamstrings and adductors limited by 50%    FUNCTIONAL  TESTS:  Unable to do functional squat due to pain and restrictions in mobility   GAIT: Distance walked: 300'  Assistive device utilized: Single point cane Level of assistance: Modified independence Comments: decreased cadence and decreased bil step height, mildly forward flexed posture  POSTURE:  Rounded shoulders, posterior pelvic tilt  LUMBARAROM/PROM  A/PROM A/PROM  eval  Flexion Limited by 75%  Extension Limited by 25%  Right lateral flexion Limited by 75%  Left lateral flexion Limited by 75%  Right rotation Limited by 50%  Left rotation Limited by 50%   (Blank rows = not tested)  LOWER EXTREMITY AROM/PROM:  [facility]  LOWER EXTREMITY MMT:  Bil hips 4/5; bil knees and ankles 5/5  PELVIC MMT:    MMT eval 12/10/2021   Internal Anal Sphincter  4/5  External Anal Sphincter  4/5  Puborectalis    Diastasis Recti    (Blank rows = not tested)  PALPATION: GENERAL no TTP but did have moderate fascial restrictions throughout abdomen               External Perineal Exam 12/10/2021 WFL, no pain reported (pt only feels pressure at baseline due to cauda equina, no redness)                Internal Pelvic Floor 12/10/21 pt able to feel pressure ("something there") but nothing other than this.   TONE: Mild increase   TODAY'S TREATMENT   12/10/2021 Pt consented to internal rectal assessment and treatment Pt found to have ability to contract/relax/bulge/relax on command, did not demonstrate inability to complete any of these tasks, completed x10 reps with pt consistently demonstrating good coordination but did need minimal cues.   Pt educated to continue HEP, breathing mechanics and voiding mechanics.   12/05/2021  Lower Lumbar rotation 2x10 2x10 pelvic tilts in hooklying  TA activations 2x10 in supine with exhale Diaphragmatic breathing in sitting 2x10  Green band row in sitting x20 Green band seated hip abduction x20 Green band seated bil shoulder horizontal abduction 2x10  X10  balloon breathing techniques while sitting at mat table with feet at squatty potty   11/28/2021 EVAL Examination completed, findings reviewed, pt educated on POC, HEP, and handout on abdominal massage and squatty potty mechanics for voiding. Pt motivated to participate in PT and agreeable to attempt recommendations.     PATIENT EDUCATION:  Education details: [place] Person educated: Patient Education method: Explanation, Demonstration, Tactile cues, Verbal cues, and Handouts Education comprehension: verbalized understanding and returned demonstration   HOME EXERCISE PROGRAM: [id number]  ASSESSMENT:  CLINICAL IMPRESSION: Patient very pleased with progress as he is having more complete voids now and not having to strain. Pt session focused on internal rectal assessment and treatment and pt reported this was helpful for him to understand coordination and make sure he was doing this correctly. Pt tolerated well.  Pt would benefit from additional PT to further address deficits.     OBJECTIVE IMPAIRMENTS decreased coordination, decreased endurance, decreased mobility, decreased strength, increased fascial restrictions, increased muscle spasms, impaired flexibility, impaired tone, improper body mechanics, postural dysfunction, and pain.   ACTIVITY LIMITATIONS carrying, lifting, bending, sitting, standing, squatting, stairs, and continence  PARTICIPATION LIMITATIONS: community activity  PERSONAL FACTORS Time since onset of injury/illness/exacerbation and 3+ comorbidities: complex medical history  are also affecting patient's functional outcome.   REHAB POTENTIAL: Good  CLINICAL DECISION MAKING: Stable/uncomplicated  EVALUATION COMPLEXITY: Low   GOALS: Goals reviewed with patient? Yes  SHORT TERM GOALS: Target date: 12/26/2021   Pt to be I with HEP.  Baseline: Goal status: INITIAL  2.  Pt will report BMs are complete  at least 50% of the time due to improved bowel habits and  evacuation techniques.  Baseline:  Goal status: INITIAL  3.  Pt to demonstrate improved coordination of pelvic floor and breathing mechanics to allow full relaxation of abdomen and pelvic floor then full contraction and full relaxation to improve mobility and bowel regularity without pain at least 50% of the time.  Baseline: unable  Goal status: INITIAL  4.  Pt to be I and have good recall with abdominal massage and voiding and breathing mechanics for improved bowel habits.  Baseline:  Goal status: INITIAL   LONG TERM GOALS: Target date: 02/28/2022   Pt to be I with advanced HEP.  Baseline:  Goal status: INITIAL  2.  Pt to demonstrate improved coordination of pelvic floor and breathing mechanics to allow full relaxation of abdomen and pelvic floor then full contraction and full relaxation to improve mobility and bowel regularity without pain at least 75% of the time.  Baseline:  Goal status: INITIAL  3.  Pt will report her BMs are complete at least 75% of the time due to improved bowel habits and evacuation techniques.  Baseline:  Goal status: INITIAL    PLAN: PT FREQUENCY: 1x/week  PT DURATION:  10 sessions  PLANNED INTERVENTIONS: Therapeutic exercises, Therapeutic activity, Neuromuscular re-education, Patient/Family education, Joint mobilization, Aquatic Therapy, Dry Needling, Spinal mobilization, Cryotherapy, Moist heat, Manual lymph drainage, scar mobilization, Taping, Biofeedback, and Manual therapy  PLAN FOR NEXT SESSION: gentle stretching, breathing and voiding mechanics review, balloon breathing technique, pelvic relaxation techniques/manual  Stacy Gardner, PT, DPT 06/05/232:06 PM

## 2021-12-12 ENCOUNTER — Other Ambulatory Visit: Payer: Self-pay | Admitting: Family Medicine

## 2021-12-12 DIAGNOSIS — S343XXS Injury of cauda equina, sequela: Secondary | ICD-10-CM

## 2021-12-12 NOTE — Telephone Encounter (Signed)
Requested medication (s) are due for refill today:Due 12/15/21  Requested medication (s) are on the active medication list: yes    Last refill: 09/14/21  #90 0 refills  Future visit scheduled no  Notes to clinic:Failed due to labs. Please review.  Requested Prescriptions  Pending Prescriptions Disp Refills   tamsulosin (FLOMAX) 0.4 MG CAPS capsule [Pharmacy Med Name: TAMSULOSIN HCL 0.4 MG CAPSULE] 90 capsule 0    Sig: TAKE 1 CAPSULE BY MOUTH EVERYDAY AT BEDTIME     Urology: Alpha-Adrenergic Blocker Failed - 12/12/2021 12:33 PM      Failed - PSA in normal range and within 360 days    No results found for: LABPSA, PSA, PSA1, ULTRAPSA       Passed - Last BP in normal range    BP Readings from Last 1 Encounters:  11/26/21 130/80         Passed - Valid encounter within last 12 months    Recent Outpatient Visits           2 months ago DDD (degenerative disc disease), cervical   Primary Care at [facility], MD   3 months ago Depression, unspecified depression type   Primary Care at [facility], MD   6 months ago Loss of weight   [facility] Care at [facility], MD   7 months ago Loss of weight   Primary Care at [facility], Gildardo Pounds, NP   9 months ago History of COVID-19   Primary Care at [facility], Salomon Fick, NP       Future Appointments             In 3 weeks Byrum, Les Pou, MD [facility] Pulmonary Care

## 2021-12-17 ENCOUNTER — Ambulatory Visit: Payer: Medicare Other | Admitting: Gastroenterology

## 2021-12-19 ENCOUNTER — Ambulatory Visit: Payer: Medicare Other | Admitting: Physical Therapy

## 2021-12-19 ENCOUNTER — Telehealth: Payer: Self-pay | Admitting: Physical Therapy

## 2021-12-19 NOTE — Telephone Encounter (Signed)
PT called pt about today's appointment and this appointment was supposed to be cancelled. Pt will be at next visit.   Otelia Sergeant, PT, DPT 06/14/233:50 PM

## 2021-12-26 ENCOUNTER — Ambulatory Visit: Payer: Medicare Other | Admitting: Physical Therapy

## 2021-12-26 DIAGNOSIS — M6281 Muscle weakness (generalized): Secondary | ICD-10-CM | POA: Diagnosis not present

## 2021-12-26 DIAGNOSIS — R293 Abnormal posture: Secondary | ICD-10-CM

## 2021-12-26 DIAGNOSIS — M62838 Other muscle spasm: Secondary | ICD-10-CM

## 2021-12-26 DIAGNOSIS — R279 Unspecified lack of coordination: Secondary | ICD-10-CM

## 2021-12-26 NOTE — Therapy (Signed)
OUTPATIENT PHYSICAL THERAPY Treatment   Patient Name: Lawrence Cantu MRN: [id number] DOB:[birth year], 36 y.o., male Today's Date: 12/26/2021   PT End of Session - 12/26/21 1616     Visit Number 4    Date for PT Re-Evaluation 02/28/22    Authorization Type medicare A& B    PT Start Time 1612    PT Stop Time 1648    PT Time Calculation (min) 36 min    Activity Tolerance Patient tolerated treatment well    Behavior During Therapy [facility] for tasks assessed/performed              Past Medical History:  Diagnosis Date   Ankylosing spondylitis ([place])    Anxiety    Arthritis    Phreesia 12/05/2019   BPH (benign prostatic hyperplasia)    Cauda equina syndrome (HCC)    Chronic pain    DDD (degenerative disc disease), cervical    DDD (degenerative disc disease), lumbar    Depression    Phreesia 12/05/2019   Enlarged prostate    Hyperlipidemia    Hypertension    pt denies   Neuromuscular disorder ([place])    Phreesia 12/05/2019   Pneumonia    x 2 as a child, 1 x as an adult   Post laminectomy syndrome    Sarcoidosis    Spinal stenosis    Past Surgical History:  Procedure Laterality Date   ANAL RECTAL MANOMETRY N/A 01/17/2021   Procedure: ANO RECTAL MANOMETRY;  Surgeon: Yetta Flock, MD;  Location: [facility];  Service: Gastroenterology;  Laterality: N/A;   BRONCHIAL WASHINGS  06/13/2020   Procedure: BRONCHIAL WASHINGS;  Surgeon: Collene Gobble, MD;  Location: [facility];  Service: Pulmonary;;   CERVICAL FUSION     FINE NEEDLE ASPIRATION  06/13/2020   Procedure: FINE NEEDLE ASPIRATION (FNA) LINEAR;  Surgeon: Collene Gobble, MD;  Location: [facility];  Service: Pulmonary;;   IR FL GUIDED LOC OF NEEDLE/CATH TIP FOR SPINAL INJECTION LT     has had at least 10 of these   [place]  ?2018   SPINAL FUSION     SPINE SURGERY N/A    Phreesia 12/05/2019   VIDEO BRONCHOSCOPY WITH ENDOBRONCHIAL  ULTRASOUND Bilateral 06/13/2020   Procedure: VIDEO BRONCHOSCOPY WITH ENDOBRONCHIAL ULTRASOUND;  Surgeon: Collene Gobble, MD;  Location: [facility] ENDOSCOPY;  Service: Pulmonary;  Laterality: Bilateral;   Patient Active Problem List   Diagnosis Date Noted   Body mass index (BMI) 31.0-31.9, adult 10/09/2021   Body mass index (BMI) 34.0-34.9, adult 10/09/2021   Overweight 10/09/2021   Hyperthyroidism 07/18/2021   Depression 05/17/2021   Constipation due to outlet dysfunction    Dyssynergic defecation    Rectal abnormality    Suspected sleep apnea 12/19/2020   Sacroiliitis, not elsewhere classified ([place]) 12/05/2020   Obesity 12/05/2020   DDD (degenerative disc disease), cervical 12/05/2020   Spinal cord stimulator status 09/25/2020   Dyspnea 04/20/2020   Sarcoidosis of lung ([place]) 04/20/2020   Ankylosing spondylitis lumbar region ([place]) 01/04/2020   Thoracic spine pain 01/04/2020   HLA B27 positive 12/27/2019   Cervical radiculitis 12/18/2019   Chronic pain syndrome 12/18/2019   Neck pain 12/17/2019   Cervical herniated disc 10/07/2016    PCP: Dorna Mai, MD  REFERRING PROVIDER: Yetta Flock, MD  REFERRING DIAG: K60.2 (ICD-10-CM) - Anal fissure K59.00 (ICD-10-CM) - Constipation, unspecified constipation type K62.89 (ICD-10-CM) - Rectal pain K59.02 (ICD-10-CM) -  Dyssynergic defecation  THERAPY DIAG:  Muscle weakness (generalized)  Abnormal posture  Other muscle spasm  Unspecified lack of coordination  Rationale for Evaluation and Treatment Rehabilitation  ONSET DATE: 7 years  SUBJECTIVE:                                                                                                                                                                                           SUBJECTIVE STATEMENT: Pt reports "things are coming easier now" does still use digit stimulation to intimate bowel movement, ~10s after doing this, 75% less straining, usually having bowel movement  at least once per day now.  Now only spending no more than 20 mins in bathroom from start to finish.  Fluid intake: 32oz a couple times per day Patient confirms identification and approves PT to assess pelvic floor and treatment Yes   PAIN:  Are you having pain? Yes NPRS scale: 7/10 fairly regularly Pain location:  back pain with ankylosing spondylitis  Pain type: aching and throbbing Pain description: constant   Aggravating factors: working on moving into a new home and has been doing a lot for this.  Relieving factors: mobility   PRECAUTIONS: Other: cauda equina, spinal stimulator in place, ankylosing spondylitis   PERTINENT HISTORY:  cauda equina syndrome initially diagnosed 7 years ago s/p spinal surgery x 2, ankylosing spondylitis on Methotrexate and Renflexis and sarcoidosis which was diagnosed 06/2020. He is here for follow up for chronic constipation, rectal pain. Sexual abuse: NO  BOWEL MOVEMENT Pain with bowel movement: No Type of bowel movement:Type (Bristol Stool Scale) 2-3, Frequency 1-2x per day, Strain Yes, and Splinting digital stimulation Fully empty rectum: No Leakage: No Pads: No Fiber supplement: Yes: citrucel   URINATION Pain with urination: No Fully empty bladder: No and does manually assist from base of penis to end to finish emptying  Stream: Strong Urgency: No Frequency: 2x per night with strong urge, about every 2 hours. Does report on days with more pain will go more often and smaller amounts Leakage:  no  Pads: No  INTERCOURSE Pain with intercourse:  no pain  Climax: no pain  Ejaculation: Yes: does have to assist to finish emptying same as urination      OBJECTIVE:   DIAGNOSTIC FINDINGS:    COGNITION:  Overall cognitive status: Within functional limits for tasks assessed     SENSATION:  Light touch: Deficits at pelvic region mostly at rectum   Proprioception: Appears intact  MUSCLE LENGTH: Bil hamstrings and adductors limited  by 50%    FUNCTIONAL TESTS:  Unable to do functional squat due to pain and restrictions in mobility  GAIT: Distance walked: 300' Assistive device utilized: Single point cane Level of assistance: Modified independence Comments: decreased cadence and decreased bil step height, mildly forward flexed posture  POSTURE:  Rounded shoulders, posterior pelvic tilt  LUMBARAROM/PROM  A/PROM A/PROM  eval  Flexion Limited by 75%  Extension Limited by 25%  Right lateral flexion Limited by 75%  Left lateral flexion Limited by 75%  Right rotation Limited by 50%  Left rotation Limited by 50%   (Blank rows = not tested)  LOWER EXTREMITY AROM/PROM:  [facility]  LOWER EXTREMITY MMT:  Bil hips 4/5; bil knees and ankles 5/5  PELVIC MMT:    MMT eval 12/10/2021   Internal Anal Sphincter  4/5  External Anal Sphincter  4/5  Puborectalis    Diastasis Recti    (Blank rows = not tested)  PALPATION: GENERAL no TTP but did have moderate fascial restrictions throughout abdomen               External Perineal Exam 12/10/2021 WFL, no pain reported (pt only feels pressure at baseline due to cauda equina, no redness)                Internal Pelvic Floor 12/10/21 pt able to feel pressure ("something there") but nothing other than this.   TONE: Mild increase   TODAY'S TREATMENT   12/26/21:  Manual work: abdominal massage x10CW, x10CCW, x10 M method, x10 ILU method Fascial release in mid and upper abdominal quadrants with direct method used. Pt tolerated well without pain.   12/10/2021 Pt consented to internal rectal assessment and treatment Pt found to have ability to contract/relax/bulge/relax on command, did not demonstrate inability to complete any of these tasks, completed x10 reps with pt consistently demonstrating good coordination but did need minimal cues.   Pt educated to continue HEP, breathing mechanics and voiding mechanics.   12/05/2021  Lower Lumbar rotation 2x10 2x10 pelvic tilts in  hooklying  TA activations 2x10 in supine with exhale Diaphragmatic breathing in sitting 2x10  Green band row in sitting x20 Green band seated hip abduction x20 Green band seated bil shoulder horizontal abduction 2x10  X10 balloon breathing techniques while sitting at mat table with feet at squatty potty   11/28/2021 EVAL Examination completed, findings reviewed, pt educated on POC, HEP, and handout on abdominal massage and squatty potty mechanics for voiding. Pt motivated to participate in PT and agreeable to attempt recommendations.     PATIENT EDUCATION:  Education details: [place] Person educated: Patient Education method: Explanation, Demonstration, Tactile cues, Verbal cues, and Handouts Education comprehension: verbalized understanding and returned demonstration   HOME EXERCISE PROGRAM: [id number]  ASSESSMENT:  CLINICAL IMPRESSION: Patient reports he continues to be pleased with progress, feels he is doing much better with his bowel movements now. Pt does report intermittently may feel the need to strain but much less often. Pt session focused on abdominal massage with multiple techniques used to improve mobility of tissue at abdomen and promote improved peristalsis.  Pt tolerated well.  Pt would benefit from additional PT to further address deficits.     OBJECTIVE IMPAIRMENTS decreased coordination, decreased endurance, decreased mobility, decreased strength, increased fascial restrictions, increased muscle spasms, impaired flexibility, impaired tone, improper body mechanics, postural dysfunction, and pain.   ACTIVITY LIMITATIONS carrying, lifting, bending, sitting, standing, squatting, stairs, and continence  PARTICIPATION LIMITATIONS: community activity  PERSONAL FACTORS Time since onset of injury/illness/exacerbation and 3+ comorbidities: complex medical history  are also affecting patient's functional outcome.   REHAB POTENTIAL:  Good  CLINICAL DECISION MAKING:  Stable/uncomplicated  EVALUATION COMPLEXITY: Low   GOALS: Goals reviewed with patient? Yes  SHORT TERM GOALS: Target date: 12/26/2021   Pt to be I with HEP.  Baseline: Goal status: MET 12/26/21   2.  Pt will report BMs are complete  at least 50% of the time due to improved bowel habits and evacuation techniques.  Baseline:  Goal status: MET 12/26/21   3.  Pt to demonstrate improved coordination of pelvic floor and breathing mechanics to allow full relaxation of abdomen and pelvic floor then full contraction and full relaxation to improve mobility and bowel regularity without pain at least 50% of the time.  Baseline: unable  Goal status: MET 12/26/21   4.  Pt to be I and have good recall with abdominal massage and voiding and breathing mechanics for improved bowel habits.  Baseline:  Goal status: MET 12/26/21    LONG TERM GOALS: Target date: 02/28/2022   Pt to be I with advanced HEP.  Baseline:  Goal status: INITIAL  2.  Pt to demonstrate improved coordination of pelvic floor and breathing mechanics to allow full relaxation of abdomen and pelvic floor then full contraction and full relaxation to improve mobility and bowel regularity without pain at least 75% of the time.  Baseline:  Goal status: INITIAL  3.  Pt will report her BMs are complete at least 75% of the time due to improved bowel habits and evacuation techniques.  Baseline:  Goal status: INITIAL    PLAN: PT FREQUENCY: 1x/week  PT DURATION:  10 sessions  PLANNED INTERVENTIONS: Therapeutic exercises, Therapeutic activity, Neuromuscular re-education, Patient/Family education, Joint mobilization, Aquatic Therapy, Dry Needling, Spinal mobilization, Cryotherapy, Moist heat, Manual lymph drainage, scar mobilization, Taping, Biofeedback, and Manual therapy  PLAN FOR NEXT SESSION: aquatics for relaxation and improved global mobility to help pelvic floor relaxation with less pain due to buoyancy   Stacy Gardner, PT,  DPT 06/21/234:56 PM

## 2022-01-01 ENCOUNTER — Ambulatory Visit: Payer: Medicare Other | Admitting: Physical Therapy

## 2022-01-03 ENCOUNTER — Ambulatory Visit: Payer: Medicare Other | Admitting: Emergency Medicine

## 2022-01-09 ENCOUNTER — Ambulatory Visit: Payer: Medicare Other | Admitting: Internal Medicine

## 2022-01-11 ENCOUNTER — Encounter: Payer: Self-pay | Admitting: Physical Therapy

## 2022-01-11 ENCOUNTER — Ambulatory Visit: Payer: Medicare Other | Attending: Gastroenterology | Admitting: Physical Therapy

## 2022-01-11 DIAGNOSIS — R279 Unspecified lack of coordination: Secondary | ICD-10-CM | POA: Diagnosis present

## 2022-01-11 DIAGNOSIS — M62838 Other muscle spasm: Secondary | ICD-10-CM | POA: Diagnosis present

## 2022-01-11 DIAGNOSIS — R293 Abnormal posture: Secondary | ICD-10-CM | POA: Diagnosis present

## 2022-01-11 DIAGNOSIS — M6281 Muscle weakness (generalized): Secondary | ICD-10-CM | POA: Diagnosis not present

## 2022-01-11 DIAGNOSIS — R269 Unspecified abnormalities of gait and mobility: Secondary | ICD-10-CM | POA: Insufficient documentation

## 2022-01-11 NOTE — Therapy (Addendum)
OUTPATIENT PHYSICAL THERAPY Treatment   Patient Name: Lawrence Cantu MRN: [id number] DOB:Apr 23, 1986, 36 y.o., male Today's Date: 01/11/2022   PT End of Session - 01/11/22 1805     Visit Number 5    Date for PT Re-Evaluation 02/28/22    Authorization Type medicare A& B    PT Start Time 1415   30 min late, got wrong directions   PT Stop Time 1435    PT Time Calculation (min) 20 min    Activity Tolerance Patient tolerated treatment well    Behavior During Therapy [facility] for tasks assessed/performed              Past Medical History:  Diagnosis Date   Ankylosing spondylitis ([place])    Anxiety    Arthritis    Phreesia 12/05/2019   BPH (benign prostatic hyperplasia)    Cauda equina syndrome (HCC)    Chronic pain    DDD (degenerative disc disease), cervical    DDD (degenerative disc disease), lumbar    Depression    Phreesia 12/05/2019   Enlarged prostate    Hyperlipidemia    Hypertension    pt denies   Neuromuscular disorder ([place])    Phreesia 12/05/2019   Pneumonia    x 2 as a child, 1 x as an adult   Post laminectomy syndrome    Sarcoidosis    Spinal stenosis    Past Surgical History:  Procedure Laterality Date   ANAL RECTAL MANOMETRY N/A 01/17/2021   Procedure: ANO RECTAL MANOMETRY;  Surgeon: Yetta Flock, MD;  Location: [facility];  Service: Gastroenterology;  Laterality: N/A;   BRONCHIAL WASHINGS  06/13/2020   Procedure: BRONCHIAL WASHINGS;  Surgeon: Collene Gobble, MD;  Location: [facility];  Service: Pulmonary;;   CERVICAL FUSION     FINE NEEDLE ASPIRATION  06/13/2020   Procedure: FINE NEEDLE ASPIRATION (FNA) LINEAR;  Surgeon: Collene Gobble, MD;  Location: [facility];  Service: Pulmonary;;   IR FL GUIDED LOC OF NEEDLE/CATH TIP FOR SPINAL INJECTION LT     has had at least 10 of these   [place]  ?2018   SPINAL FUSION     SPINE SURGERY N/A    Phreesia 12/05/2019   VIDEO  BRONCHOSCOPY WITH ENDOBRONCHIAL ULTRASOUND Bilateral 06/13/2020   Procedure: VIDEO BRONCHOSCOPY WITH ENDOBRONCHIAL ULTRASOUND;  Surgeon: Collene Gobble, MD;  Location: [facility] ENDOSCOPY;  Service: Pulmonary;  Laterality: Bilateral;   Patient Active Problem List   Diagnosis Date Noted   Body mass index (BMI) 31.0-31.9, adult 10/09/2021   Body mass index (BMI) 34.0-34.9, adult 10/09/2021   Overweight 10/09/2021   Hyperthyroidism 07/18/2021   Depression 05/17/2021   Constipation due to outlet dysfunction    Dyssynergic defecation    Rectal abnormality    Suspected sleep apnea 12/19/2020   Sacroiliitis, not elsewhere classified ([place]) 12/05/2020   Obesity 12/05/2020   DDD (degenerative disc disease), cervical 12/05/2020   Spinal cord stimulator status 09/25/2020   Dyspnea 04/20/2020   Sarcoidosis of lung ([place]) 04/20/2020   Ankylosing spondylitis lumbar region [facility]) 01/04/2020   Thoracic spine pain 01/04/2020   HLA B27 positive 12/27/2019   Cervical radiculitis 12/18/2019   Chronic pain syndrome 12/18/2019   Neck pain 12/17/2019   Cervical herniated disc 10/07/2016    PCP: Dorna Mai, MD  REFERRING PROVIDER: Yetta Flock, MD  REFERRING DIAG: K60.2 (ICD-10-CM) - Anal fissure K59.00 (ICD-10-CM) - Constipation, unspecified constipation type K62.89 (  ICD-10-CM) - Rectal pain K59.02 (ICD-10-CM) - Dyssynergic defecation  THERAPY DIAG:  Muscle weakness (generalized)  Abnormal posture  Other muscle spasm  Abnormality of gait and mobility  Unspecified lack of coordination  Rationale for Evaluation and Treatment Rehabilitation  ONSET DATE: 7 years  SUBJECTIVE:                                                                                                                                                                                           SUBJECTIVE STATEMENT: I am sorry I was late, My Chart gave me directions to [place]. I am having a pretty big flare up of pain  back and neck primarily.   Fluid intake: 32oz a couple times per day Patient confirms identification and approves PT to assess pelvic floor and treatment Yes   PAIN:  Are you having pain? Yes NPRS scale: 9/10 fairly regularly Pain location:  back and neck  Pain type: aching and throbbing Pain description: constant   Aggravating factors: working on moving into a new home and has been doing a lot for this.  Relieving factors: mobility   PRECAUTIONS: Other: cauda equina, spinal stimulator in place, ankylosing spondylitis   PERTINENT HISTORY:  cauda equina syndrome initially diagnosed 7 years ago s/p spinal surgery x 2, ankylosing spondylitis on Methotrexate and Renflexis and sarcoidosis which was diagnosed 06/2020. He is here for follow up for chronic constipation, rectal pain. Sexual abuse: NO  BOWEL MOVEMENT Pain with bowel movement: No Type of bowel movement:Type (Bristol Stool Scale) 2-3, Frequency 1-2x per day, Strain Yes, and Splinting digital stimulation Fully empty rectum: No Leakage: No Pads: No Fiber supplement: Yes: citrucel   URINATION Pain with urination: No Fully empty bladder: No and does manually assist from base of penis to end to finish emptying  Stream: Strong Urgency: No Frequency: 2x per night with strong urge, about every 2 hours. Does report on days with more pain will go more often and smaller amounts Leakage:  no  Pads: No  INTERCOURSE Pain with intercourse:  no pain  Climax: no pain  Ejaculation: Yes: does have to assist to finish emptying same as urination      OBJECTIVE:   DIAGNOSTIC FINDINGS:    COGNITION:  Overall cognitive status: Within functional limits for tasks assessed     SENSATION:  Light touch: Deficits at pelvic region mostly at rectum   Proprioception: Appears intact  MUSCLE LENGTH: Bil hamstrings and adductors limited by 50%    FUNCTIONAL TESTS:  Unable to do functional squat due to pain and restrictions in  mobility   GAIT: Distance walked: 300' Assistive device utilized:  Single point cane Level of assistance: Modified independence Comments: decreased cadence and decreased bil step height, mildly forward flexed posture  POSTURE:  Rounded shoulders, posterior pelvic tilt  LUMBARAROM/PROM  A/PROM A/PROM  eval  Flexion Limited by 75%  Extension Limited by 25%  Right lateral flexion Limited by 75%  Left lateral flexion Limited by 75%  Right rotation Limited by 50%  Left rotation Limited by 50%   (Blank rows = not tested)  LOWER EXTREMITY AROM/PROM:  [facility]  LOWER EXTREMITY MMT:  Bil hips 4/5; bil knees and ankles 5/5  PELVIC MMT:    MMT eval 12/10/2021   Internal Anal Sphincter  4/5  External Anal Sphincter  4/5  Puborectalis    Diastasis Recti    (Blank rows = not tested)  PALPATION: GENERAL no TTP but did have moderate fascial restrictions throughout abdomen               External Perineal Exam 12/10/2021 WFL, no pain reported (pt only feels pressure at baseline due to cauda equina, no redness)                Internal Pelvic Floor 12/10/21 pt able to feel pressure ("something there") but nothing other than this.   TONE: Mild increase   TODAY'S TREATMENT   01/11/22: Pt  significantly late due to getting the wrong directions so this impacted his treatment. PTA provided horizontal decompression for pain management for both back and neck. Hot tub jets provided soft tissue work to lumbar muscles post.  12/26/21:  Manual work: abdominal massage x10CW, x10CCW, x10 M method, x10 ILU method Fascial release in mid and upper abdominal quadrants with direct method used. Pt tolerated well without pain.   12/10/2021 Pt consented to internal rectal assessment and treatment Pt found to have ability to contract/relax/bulge/relax on command, did not demonstrate inability to complete any of these tasks, completed x10 reps with pt consistently demonstrating good coordination but did need  minimal cues.   Pt educated to continue HEP, breathing mechanics and voiding mechanics.   12/05/2021  Lower Lumbar rotation 2x10 2x10 pelvic tilts in hooklying  TA activations 2x10 in supine with exhale Diaphragmatic breathing in sitting 2x10  Green band row in sitting x20 Green band seated hip abduction x20 Green band seated bil shoulder horizontal abduction 2x10  X10 balloon breathing techniques while sitting at mat table with feet at squatty potty   11/28/2021 EVAL Examination completed, findings reviewed, pt educated on POC, HEP, and handout on abdominal massage and squatty potty mechanics for voiding. Pt motivated to participate in PT and agreeable to attempt recommendations.     PATIENT EDUCATION:  Education details: [place] Person educated: Patient Education method: Explanation, Demonstration, Tactile cues, Verbal cues, and Handouts Education comprehension: verbalized understanding and returned demonstration   HOME EXERCISE PROGRAM: [place]  ASSESSMENT:  CLINICAL IMPRESSION: Pt arrived about 30 minutes late so this impacted our treatment time. Pt was in a lot of pain, back > neck. Pt really like the horizontal decompression float with gentle perturbations by PTA.   OBJECTIVE IMPAIRMENTS decreased coordination, decreased endurance, decreased mobility, decreased strength, increased fascial restrictions, increased muscle spasms, impaired flexibility, impaired tone, improper body mechanics, postural dysfunction, and pain.   ACTIVITY LIMITATIONS carrying, lifting, bending, sitting, standing, squatting, stairs, and continence  PARTICIPATION LIMITATIONS: community activity  PERSONAL FACTORS Time since onset of injury/illness/exacerbation and 3+ comorbidities: complex medical history  are also affecting patient's functional outcome.   REHAB POTENTIAL: Good  CLINICAL DECISION MAKING:  Stable/uncomplicated  EVALUATION COMPLEXITY: Low   GOALS: Goals reviewed with patient?  Yes  SHORT TERM GOALS: Target date: 12/26/2021   Pt to be I with HEP.  Baseline: Goal status: MET 12/26/21   2.  Pt will report BMs are complete  at least 50% of the time due to improved bowel habits and evacuation techniques.  Baseline:  Goal status: MET 12/26/21   3.  Pt to demonstrate improved coordination of pelvic floor and breathing mechanics to allow full relaxation of abdomen and pelvic floor then full contraction and full relaxation to improve mobility and bowel regularity without pain at least 50% of the time.  Baseline: unable  Goal status: MET 12/26/21   4.  Pt to be I and have good recall with abdominal massage and voiding and breathing mechanics for improved bowel habits.  Baseline:  Goal status: MET 12/26/21    LONG TERM GOALS: Target date: 02/28/2022   Pt to be I with advanced HEP.  Baseline:  Goal status: INITIAL  2.  Pt to demonstrate improved coordination of pelvic floor and breathing mechanics to allow full relaxation of abdomen and pelvic floor then full contraction and full relaxation to improve mobility and bowel regularity without pain at least 75% of the time.  Baseline:  Goal status: INITIAL  3.  Pt will report her BMs are complete at least 75% of the time due to improved bowel habits and evacuation techniques.  Baseline:  Goal status: INITIAL    PLAN: PT FREQUENCY: 1x/week  PT DURATION:  10 sessions  PLANNED INTERVENTIONS: Therapeutic exercises, Therapeutic activity, Neuromuscular re-education, Patient/Family education, Joint mobilization, Aquatic Therapy, Dry Needling, Spinal mobilization, Cryotherapy, Moist heat, Manual lymph drainage, scar mobilization, Taping, Biofeedback, and Manual therapy  PLAN FOR NEXT SESSION:  See how decompression helped pt for his pain.  Myrene Galas, PTA 01/11/22 6:07 PM    PHYSICAL THERAPY DISCHARGE SUMMARY  Visits from Start of Care: 5  Current functional level related to goals / functional  outcomes: Unable to formally reassess this treatment note was pt's last visit and did not make more appointments.    Remaining deficits: Unable to formally reassess   Education / Equipment: HEP   Patient agrees to discharge. Patient goals were partially met. Patient is being discharged due to not returning since the last visit.   Stacy Gardner, PT, DPT 04/12/2310:31 AM

## 2022-01-18 ENCOUNTER — Ambulatory Visit: Payer: Medicare Other | Admitting: Physical Therapy

## 2022-01-18 NOTE — Therapy (Deleted)
OUTPATIENT PHYSICAL THERAPY Treatment   Patient Name: Lawrence Cantu MRN: [id number] DOB:[birth year], 36 y.o., male Today's Date: 01/18/2022      Past Medical History:  Diagnosis Date   Ankylosing spondylitis ([place])    Anxiety    Arthritis    Phreesia 12/05/2019   BPH (benign prostatic hyperplasia)    Cauda equina syndrome (HCC)    Chronic pain    DDD (degenerative disc disease), cervical    DDD (degenerative disc disease), lumbar    Depression    Phreesia 12/05/2019   Enlarged prostate    Hyperlipidemia    Hypertension    pt denies   Neuromuscular disorder ([place])    Phreesia 12/05/2019   Pneumonia    x 2 as a child, 1 x as an adult   Post laminectomy syndrome    Sarcoidosis    Spinal stenosis    Past Surgical History:  Procedure Laterality Date   ANAL RECTAL MANOMETRY N/A 01/17/2021   Procedure: ANO RECTAL MANOMETRY;  Surgeon: Yetta Flock, MD;  Location: [facility];  Service: Gastroenterology;  Laterality: N/A;   BRONCHIAL WASHINGS  06/13/2020   Procedure: BRONCHIAL WASHINGS;  Surgeon: Collene Gobble, MD;  Location: [facility];  Service: Pulmonary;;   CERVICAL FUSION     FINE NEEDLE ASPIRATION  06/13/2020   Procedure: FINE NEEDLE ASPIRATION (FNA) LINEAR;  Surgeon: Collene Gobble, MD;  Location: [facility];  Service: Pulmonary;;   IR FL GUIDED LOC OF NEEDLE/CATH TIP FOR SPINAL INJECTION LT     has had at least 10 of these   [place]  ?2018   SPINAL FUSION     SPINE SURGERY N/A    Phreesia 12/05/2019   VIDEO BRONCHOSCOPY WITH ENDOBRONCHIAL ULTRASOUND Bilateral 06/13/2020   Procedure: VIDEO BRONCHOSCOPY WITH ENDOBRONCHIAL ULTRASOUND;  Surgeon: Collene Gobble, MD;  Location: [facility] ENDOSCOPY;  Service: Pulmonary;  Laterality: Bilateral;   Patient Active Problem List   Diagnosis Date Noted   Body mass index (BMI) 31.0-31.9, adult 10/09/2021   Body mass index (BMI) 34.0-34.9, adult  10/09/2021   Overweight 10/09/2021   Hyperthyroidism 07/18/2021   Depression 05/17/2021   Constipation due to outlet dysfunction    Dyssynergic defecation    Rectal abnormality    Suspected sleep apnea 12/19/2020   Sacroiliitis, not elsewhere classified ([place]) 12/05/2020   Obesity 12/05/2020   DDD (degenerative disc disease), cervical 12/05/2020   Spinal cord stimulator status 09/25/2020   Dyspnea 04/20/2020   Sarcoidosis of lung ([place]) 04/20/2020   Ankylosing spondylitis lumbar region ([place]) 01/04/2020   Thoracic spine pain 01/04/2020   HLA B27 positive 12/27/2019   Cervical radiculitis 12/18/2019   Chronic pain syndrome 12/18/2019   Neck pain 12/17/2019   Cervical herniated disc 10/07/2016    PCP: Dorna Mai, MD  REFERRING PROVIDER: Yetta Flock, MD  REFERRING DIAG: K60.2 (ICD-10-CM) - Anal fissure K59.00 (ICD-10-CM) - Constipation, unspecified constipation type K62.89 (ICD-10-CM) - Rectal pain K59.02 (ICD-10-CM) - Dyssynergic defecation  THERAPY DIAG:  Muscle weakness (generalized)  Abnormal posture  Other muscle spasm  Abnormality of gait and mobility  Unspecified lack of coordination  Rationale for Evaluation and Treatment Rehabilitation  ONSET DATE: 7 years  SUBJECTIVE:  SUBJECTIVE STATEMENT: I am sorry I was late, My Chart gave me directions to [place]. I am having a pretty big flare up of pain back and neck primarily.   Fluid intake: 32oz a couple times per day Patient confirms identification and approves PT to assess pelvic floor and treatment Yes   PAIN:  Are you having pain? Yes NPRS scale: 9/10 fairly regularly Pain location:  back and neck  Pain type: aching and throbbing Pain description: constant   Aggravating factors: working on moving into a  new home and has been doing a lot for this.  Relieving factors: mobility   PRECAUTIONS: Other: cauda equina, spinal stimulator in place, ankylosing spondylitis   PERTINENT HISTORY:  cauda equina syndrome initially diagnosed 7 years ago s/p spinal surgery x 2, ankylosing spondylitis on Methotrexate and Renflexis and sarcoidosis which was diagnosed 06/2020. He is here for follow up for chronic constipation, rectal pain. Sexual abuse: NO  BOWEL MOVEMENT Pain with bowel movement: No Type of bowel movement:Type ([place]ristol Stool Scale) 2-3, Frequency 1-2x per day, Strain Yes, and Splinting digital stimulation Fully empty rectum: No Leakage: No Pads: No Fiber supplement: Yes: citrucel   URINATION Pain with urination: No Fully empty bladder: No and does manually assist from base of penis to end to finish emptying  Stream: Strong Urgency: No Frequency: 2x per night with strong urge, about every 2 hours. Does report on days with more pain will go more often and smaller amounts Leakage:  no  Pads: No  INTERCOURSE Pain with intercourse:  no pain  Climax: no pain  Ejaculation: Yes: does have to assist to finish emptying same as urination      OBJECTIVE:   DIAGNOSTIC FINDINGS:    COGNITION:  Overall cognitive status: Within functional limits for tasks assessed     SENSATION:  Light touch: Deficits at pelvic region mostly at rectum   Proprioception: Appears intact  MUSCLE LENGTH: Bil hamstrings and adductors limited by 50%    FUNCTIONAL TESTS:  Unable to do functional squat due to pain and restrictions in mobility   GAIT: Distance walked: 300' Assistive device utilized: Single point cane Level of assistance: Modified independence Comments: decreased cadence and decreased bil step height, mildly forward flexed posture  POSTURE:  Rounded shoulders, posterior pelvic tilt  LUMBARAROM/PROM  A/PROM A/PROM  eval  Flexion Limited by 75%  Extension Limited by 25%  Right  lateral flexion Limited by 75%  Left lateral flexion Limited by 75%  Right rotation Limited by 50%  Left rotation Limited by 50%   (Blank rows = not tested)  LOWER EXTREMITY AROM/PROM:  [facility]  LOWER EXTREMITY MMT:  Bil hips 4/5; bil knees and ankles 5/5  PELVIC MMT:    MMT eval 12/10/2021   Internal Anal Sphincter  4/5  External Anal Sphincter  4/5  Puborectalis    Diastasis Recti    (Blank rows = not tested)  PALPATION: GENERAL no TTP but did have moderate fascial restrictions throughout abdomen               External Perineal Exam 12/10/2021 WFL, no pain reported (pt only feels pressure at baseline due to cauda equina, no redness)                Internal Pelvic Floor 12/10/21 pt able to feel pressure ("something there") but nothing other than this.   TONE: Mild increase   TODAY'S TREATMENT   01/18/22;   01/11/22: Pt  significantly late due to getting  the wrong directions so this impacted his treatment. PTA provided horizontal decompression for pain management for both back and neck. Hot tub jets provided soft tissue work to lumbar muscles post.  12/26/21:  Manual work: abdominal massage x10CW, x10CCW, x10 M method, x10 ILU method Fascial release in mid and upper abdominal quadrants with direct method used. Pt tolerated well without pain.   12/10/2021 Pt consented to internal rectal assessment and treatment Pt found to have ability to contract/relax/bulge/relax on command, did not demonstrate inability to complete any of these tasks, completed x10 reps with pt consistently demonstrating good coordination but did need minimal cues.   Pt educated to continue HEP, breathing mechanics and voiding mechanics.   12/05/2021  Lower Lumbar rotation 2x10 2x10 pelvic tilts in hooklying  TA activations 2x10 in supine with exhale Diaphragmatic breathing in sitting 2x10  Green band row in sitting x20 Green band seated hip abduction x20 Green band seated bil shoulder horizontal  abduction 2x10  X10 balloon breathing techniques while sitting at mat table with feet at squatty potty   11/28/2021 EVAL Examination completed, findings reviewed, pt educated on POC, HEP, and handout on abdominal massage and squatty potty mechanics for voiding. Pt motivated to participate in PT and agreeable to attempt recommendations.     PATIENT EDUCATION:  Education details: [place] Person educated: Patient Education method: Explanation, Demonstration, Tactile cues, Verbal cues, and Handouts Education comprehension: verbalized understanding and returned demonstration   HOME EXERCISE PROGRAM: Ideal  ASSESSMENT:  CLINICAL IMPRESSION: Pt arrived about 30 minutes late so this impacted our treatment time. Pt was in a lot of pain, back > neck. Pt really like the horizontal decompression float with gentle perturbations by PTA.   OBJECTIVE IMPAIRMENTS decreased coordination, decreased endurance, decreased mobility, decreased strength, increased fascial restrictions, increased muscle spasms, impaired flexibility, impaired tone, improper body mechanics, postural dysfunction, and pain.   ACTIVITY LIMITATIONS carrying, lifting, bending, sitting, standing, squatting, stairs, and continence  PARTICIPATION LIMITATIONS: community activity  PERSONAL FACTORS Time since onset of injury/illness/exacerbation and 3+ comorbidities: complex medical history  are also affecting patient's functional outcome.   REHAB POTENTIAL: Good  CLINICAL DECISION MAKING: Stable/uncomplicated  EVALUATION COMPLEXITY: Low   GOALS: Goals reviewed with patient? Yes  SHORT TERM GOALS: Target date: 12/26/2021   Pt to be I with HEP.  Baseline: Goal status: MET 12/26/21   2.  Pt will report BMs are complete  at least 50% of the time due to improved bowel habits and evacuation techniques.  Baseline:  Goal status: MET 12/26/21   3.  Pt to demonstrate improved coordination of pelvic floor and breathing mechanics  to allow full relaxation of abdomen and pelvic floor then full contraction and full relaxation to improve mobility and bowel regularity without pain at least 50% of the time.  Baseline: unable  Goal status: MET 12/26/21   4.  Pt to be I and have good recall with abdominal massage and voiding and breathing mechanics for improved bowel habits.  Baseline:  Goal status: MET 12/26/21    LONG TERM GOALS: Target date: 02/28/2022   Pt to be I with advanced HEP.  Baseline:  Goal status: INITIAL  2.  Pt to demonstrate improved coordination of pelvic floor and breathing mechanics to allow full relaxation of abdomen and pelvic floor then full contraction and full relaxation to improve mobility and bowel regularity without pain at least 75% of the time.  Baseline:  Goal status: INITIAL  3.  Pt will report her BMs  are complete at least 75% of the time due to improved bowel habits and evacuation techniques.  Baseline:  Goal status: INITIAL    PLAN: PT FREQUENCY: 1x/week  PT DURATION:  10 sessions  PLANNED INTERVENTIONS: Therapeutic exercises, Therapeutic activity, Neuromuscular re-education, Patient/Family education, Joint mobilization, Aquatic Therapy, Dry Needling, Spinal mobilization, Cryotherapy, Moist heat, Manual lymph drainage, scar mobilization, Taping, Biofeedback, and Manual therapy  PLAN FOR NEXT SESSION:  See how decompression helped pt for his pain.  Myrene Galas, PTA 01/18/22 7:46 AM

## 2022-02-22 ENCOUNTER — Other Ambulatory Visit: Payer: Self-pay | Admitting: Family Medicine

## 2022-02-22 DIAGNOSIS — S343XXS Injury of cauda equina, sequela: Secondary | ICD-10-CM

## 2022-02-22 NOTE — Telephone Encounter (Signed)
Requested medication (s) are due for refill today: Yes  Requested medication (s) are on the active medication list: Yes  Last refill:  12/12/21  Future visit scheduled: No  Notes to clinic:  Unable to refill per protocol due to failed labs, no updated results.      Requested Prescriptions  Pending Prescriptions Disp Refills   tamsulosin (FLOMAX) 0.4 MG CAPS capsule [Pharmacy Med Name: TAMSULOSIN HCL 0.4 MG CAPSULE] 90 capsule 0    Sig: TAKE 1 CAPSULE BY MOUTH EVERYDAY AT BEDTIME     Urology: Alpha-Adrenergic Blocker Failed - 02/22/2022  2:01 PM      Failed - PSA in normal range and within 360 days    No results found for: "LABPSA", "PSA", "PSA1", "ULTRAPSA"       Passed - Last BP in normal range    BP Readings from Last 1 Encounters:  11/26/21 130/80         Passed - Valid encounter within last 12 months    Recent Outpatient Visits           4 months ago DDD (degenerative disc disease), cervical   [facility] at [facility], MD   5 months ago Depression, unspecified depression type   Primary Care at [facility], MD   8 months ago Loss of weight   Primary Care at [facility], MD   9 months ago Loss of weight   Primary Care at [facility], Gildardo Pounds, NP   11 months ago History of COVID-19   Primary Care at [facility], Salomon Fick, NP

## 2022-02-27 ENCOUNTER — Other Ambulatory Visit: Payer: Self-pay | Admitting: Family Medicine

## 2022-02-27 DIAGNOSIS — F32A Depression, unspecified: Secondary | ICD-10-CM

## 2022-03-06 DIAGNOSIS — M545 Low back pain, unspecified: Secondary | ICD-10-CM | POA: Insufficient documentation

## 2022-03-06 DIAGNOSIS — M542 Cervicalgia: Secondary | ICD-10-CM | POA: Insufficient documentation

## 2022-03-28 ENCOUNTER — Ambulatory Visit (INDEPENDENT_AMBULATORY_CARE_PROVIDER_SITE_OTHER): Payer: Medicare Other | Admitting: Internal Medicine

## 2022-03-28 ENCOUNTER — Encounter: Payer: Self-pay | Admitting: Internal Medicine

## 2022-03-28 VITALS — BP 122/76 | HR 100 | Ht 70.0 in | Wt 223.0 lb

## 2022-03-28 DIAGNOSIS — E0590 Thyrotoxicosis, unspecified without thyrotoxic crisis or storm: Secondary | ICD-10-CM | POA: Diagnosis not present

## 2022-03-28 DIAGNOSIS — E0500 Thyrotoxicosis with diffuse goiter without thyrotoxic crisis or storm: Secondary | ICD-10-CM

## 2022-03-28 NOTE — Progress Notes (Signed)
Name: Lawrence Cantu  MRN/ DOB: [id number], [birth year]    Age/ Sex: 36 y.o., male     PCP: Georganna Skeans, MD   Reason for Endocrinology Evaluation: Hyperthyroidism     Initial Endocrinology Clinic Visit: 07/18/2021    PATIENT IDENTIFIER: [name] is a 36 y.o., male with a past medical history of hyperthyroidism, ankylosing spondylosis and sarcoidosis  . He has followed with [facility] since 07/18/2021 for consultative assistance with management of his Hyperthyroidism.   HISTORICAL SUMMARY: The patient was first diagnosed with hyperthyroidism in 2020.  He was started on methimazole at the time   Thyroid ultrasound 06/08/2019 showed mild diffuse heterogeneity of the thyroid gland without discrete nodules  This has been attributes to Graves disease  Father with thyroid cancer . Two  maternal aunts with hypothyroidism    Patient transferred care from Dr. Everardo All in September 2023  SUBJECTIVE:    Today (03/28/2022):  Lawrence Cantu is here for on the management of hyperthyroidism.    Pt has been noted with weight gain   Denies abdominal pain or nausea  Denies palpitations  Tremors have been improving  Has watery eyes and singling    He is on Renflexis   Last eye exam 06/2021  Methimazole 5 mg daily  HISTORY:  Past Medical History:  Past Medical History:  Diagnosis Date   Ankylosing spondylitis (HCC)    Anxiety    Arthritis    Phreesia 12/05/2019   BPH (benign prostatic hyperplasia)    Cauda equina syndrome (HCC)    Chronic pain    DDD (degenerative disc disease), cervical    DDD (degenerative disc disease), lumbar    Depression    Phreesia 12/05/2019   Enlarged prostate    Hyperlipidemia    Hypertension    pt denies   Neuromuscular disorder (HCC)    Phreesia 12/05/2019   Pneumonia    x 2 as a child, 1 x as an adult   Post laminectomy syndrome    Sarcoidosis    Spinal stenosis    Past Surgical History:  Past Surgical History:   Procedure Laterality Date   ANAL RECTAL MANOMETRY N/A 01/17/2021   Procedure: ANO RECTAL MANOMETRY;  Surgeon: Benancio Deeds, MD;  Location: [facility];  Service: Gastroenterology;  Laterality: N/A;   BRONCHIAL WASHINGS  06/13/2020   Procedure: BRONCHIAL WASHINGS;  Surgeon: Leslye Peer, MD;  [facility]: [facility];  Service: Pulmonary;;   CERVICAL FUSION     FINE NEEDLE ASPIRATION  06/13/2020   Procedure: FINE NEEDLE ASPIRATION (FNA) LINEAR;  Surgeon: Leslye Peer, MD;  [facility]: [facility];  Service: Pulmonary;;   IR FL GUIDED LOC OF NEEDLE/CATH TIP FOR SPINAL INJECTION LT     has had at least 10 of these   LAMINECTOMY AND MICRODISCECTOMY LUMBAR SPINE     SPINAL CORD STIMULATOR IMPLANT  ?2018   SPINAL FUSION     SPINE SURGERY N/A    Phreesia 12/05/2019   VIDEO BRONCHOSCOPY WITH ENDOBRONCHIAL ULTRASOUND Bilateral 06/13/2020   Procedure: VIDEO BRONCHOSCOPY WITH ENDOBRONCHIAL ULTRASOUND;  Surgeon: Leslye Peer, MD;  Location: [facility] ENDOSCOPY;  Service: Pulmonary;  Laterality: Bilateral;   Social History:  reports that he quit smoking about 7 years ago. His smoking use included cigarettes. He has a 2.50 pack-year smoking history. He has never used smokeless tobacco. He reports current alcohol use of about 2.0 standard drinks of alcohol per week. He reports that he does not use drugs. Family History:  Family History  Problem Relation Age of Onset   Depression Mother    Breast cancer Mother    Depression Father    Thyroid cancer Father    Depression Sister    Depression Brother    Hypertension Maternal Grandmother    Hyperlipidemia Maternal Grandmother    Heart disease Maternal Grandmother    Atrial fibrillation Maternal Grandmother    Diabetes Maternal Grandmother    Heart attack Maternal Grandfather      HOME MEDICATIONS: Allergies as of 03/28/2022       Reactions   Strawberry (diagnostic) Anaphylaxis   Other Hives   Cat Dander        Medication List         Accurate as of March 28, 2022 11:35 AM. If you have any questions, ask your nurse or doctor.          AMBULATORY NON FORMULARY MEDICATION Diltiazem gel with 2% lidocaine  Apply a pea sized amount into your rectum three times daily for 4 weeks   Citrucel oral powder Generic drug: methylcellulose Take 1 packet by mouth daily.   escitalopram 20 MG tablet Commonly known as: LEXAPRO TAKE 1 TABLET (20 MG TOTAL) BY MOUTH IN THE MORNING. DX: F32.A   folic acid 1 MG tablet Commonly known as: FOLVITE TAKE 1 TABLET BY MOUTH EVERY DAY FOR 30 DAYS   gabapentin 600 MG tablet Commonly known as: NEURONTIN Take 600 mg by mouth 3 (three) times daily. Take two tablets 3 times daily   loratadine 10 MG tablet Commonly known as: CLARITIN Take 10 mg by mouth at bedtime as needed for allergies.   Melatonin 10 MG Tabs Take 10 mg by mouth at bedtime.   methimazole 5 MG tablet Commonly known as: TAPAZOLE Take 1 tablet (5 mg total) by mouth daily.   methotrexate 2.5 MG tablet Commonly known as: RHEUMATREX Take 20 mg by mouth once a week.   metoprolol succinate 25 MG 24 hr tablet Commonly known as: TOPROL-XL Take 1 tablet (25 mg total) by mouth daily.   morphine 15 MG 12 hr tablet Commonly known as: MS CONTIN Take 15 mg by mouth 2 (two) times daily.   oxyCODONE 5 MG immediate release tablet Commonly known as: Oxy IR/ROXICODONE Take 5 mg by mouth in the morning, at noon, and at bedtime.   Renflexis 100 MG Solr Generic drug: inFLIXimab-abda Once every 6 weeks infusion   tamsulosin 0.4 MG Caps capsule Commonly known as: FLOMAX TAKE 1 CAPSULE BY MOUTH EVERYDAY AT BEDTIME   tiZANidine 2 MG tablet Commonly known as: ZANAFLEX Take 1 tablet (2 mg total) by mouth 3 (three) times daily as needed for muscle spasms.   UNABLE TO FIND Med Name: conception xr once daily          OBJECTIVE:   PHYSICAL EXAM: VS: BP 122/76 (BP Location: Left Arm, Patient Position: Sitting, Cuff  Size: Small)   Pulse 100   Ht 5\' 10"  (1.778 m)   Wt 223 lb (101.2 kg)   SpO2 97%   BMI 32.00 kg/m    EXAM: General: Pt appears well and is in NAD  Eyes: External eye exam normal without stare, lid lag or exophthalmos.  EOM intact.    Neck: General: Supple without adenopathy. Thyroid: Thyroid size normal.  No goiter or nodules appreciated.   Lungs: Clear with good BS bilat with no rales, rhonchi, or wheezes  Heart: Auscultation: RRR.  Extremities:  BL LE: No pretibial edema normal ROM  and strength.  Mental Status: Judgment, insight: Intact Orientation: Oriented to time, place, and person Mood and affect: No depression, anxiety, or agitation     DATA REVIEWED:   Latest Reference Range & Units 03/28/22 11:59  TSH 0.35 - 5.50 uIU/mL 2.53  Triiodothyronine (T3) 76 - 181 ng/dL 105  T4,Free(Direct) 0.60 - 1.60 ng/dL 0.71    Old records , labs and images have been reviewed.   ASSESSMENT / PLAN / RECOMMENDATIONS:   Hyperthyroidism:  -Clinical scenario consistent with Graves' disease -Ultrasound of the thyroid in the past did not reveal any nodules -We did gust that Graves' disease and autoimmune condition -TFTs today are normal, no change   Medications  Continue methimazole 5 mg daily    2. Graves' Disease:  -No extrathyroidal manifestation of Graves' disease      Signed electronically by: Mack Guise, MD  [facility]  [address] [place], [place] [address] Phone: [phone] FAX: [phone]      CC: [address]orna [address], [place] [address] Phone: [phone]  Fax: [phone]   Return to Endocrinology clinic as below: No future appointments.

## 2022-03-29 LAB — TSH: TSH: 2.53 u[IU]/mL (ref 0.35–5.50)

## 2022-03-29 LAB — T4, FREE: Free T4: 0.71 ng/dL (ref 0.60–1.60)

## 2022-03-29 MED ORDER — METHIMAZOLE 5 MG PO TABS: 5.0000 mg | ORAL_TABLET | Freq: Every day | ORAL | 3 refills | Status: DC

## 2022-03-31 LAB — T3: T3, Total: 105 ng/dL (ref 76–181)

## 2022-03-31 LAB — TRAB (TSH RECEPTOR BINDING ANTIBODY): TRAB: <1 IU/L (ref <=2.00)

## 2022-04-01 ENCOUNTER — Telehealth: Payer: Self-pay | Admitting: Family Medicine

## 2022-04-01 NOTE — Telephone Encounter (Signed)
Referral Request - Has patient seen PCP for this complaint? Yes/says he has spoke to Dr Redmond Pulling about this before *If NO, is insurance requiring patient see PCP for this issue before PCP can refer them? Referral for which specialty: urologist Preferred provider/office: ? Reason for referral: has problems releasing in the bathroom from nerve damage

## 2022-04-08 NOTE — Telephone Encounter (Signed)
Patient said that an conversation about this situation was talk about at last OV Patient request referral Please advise

## 2022-04-09 ENCOUNTER — Other Ambulatory Visit: Payer: Self-pay | Admitting: Family Medicine

## 2022-04-09 DIAGNOSIS — R39198 Other difficulties with micturition: Secondary | ICD-10-CM

## 2022-04-09 NOTE — Telephone Encounter (Signed)
Patient request to see a urologist . Patient request a referral

## 2022-05-15 ENCOUNTER — Ambulatory Visit: Payer: Self-pay

## 2022-05-15 ENCOUNTER — Telehealth: Payer: Medicare Other | Admitting: Physician Assistant

## 2022-05-15 DIAGNOSIS — J210 Acute bronchiolitis due to respiratory syncytial virus: Secondary | ICD-10-CM

## 2022-05-15 DIAGNOSIS — J069 Acute upper respiratory infection, unspecified: Secondary | ICD-10-CM

## 2022-05-15 DIAGNOSIS — B9689 Other specified bacterial agents as the cause of diseases classified elsewhere: Secondary | ICD-10-CM

## 2022-05-15 MED ORDER — PREDNISONE 20 MG PO TABS: 40.0000 mg | ORAL_TABLET | Freq: Every day | ORAL | 0 refills | Status: DC

## 2022-05-15 MED ORDER — AMOXICILLIN-POT CLAVULANATE 875-125 MG PO TABS: 1.0000 | ORAL_TABLET | Freq: Two times a day (BID) | ORAL | 0 refills | Status: DC

## 2022-05-15 MED ORDER — PROMETHAZINE-DM 6.25-15 MG/5ML PO SYRP: 5.0000 mL | ORAL_SOLUTION | Freq: Four times a day (QID) | ORAL | 0 refills | Status: DC | PRN

## 2022-05-15 MED ORDER — BENZONATATE 100 MG PO CAPS: 100.0000 mg | ORAL_CAPSULE | Freq: Three times a day (TID) | ORAL | 0 refills | Status: DC | PRN

## 2022-05-15 NOTE — Patient Instructions (Signed)
Lawrence Cantu, thank you for joining Lawrence Loveless, PA-C for today's virtual visit.  While this provider is not your primary care provider (PCP), if your PCP is located in our provider database this encounter information will be shared with them immediately following your visit.   A [facility] MyChart account gives you access to today's visit and all your visits, tests, and labs performed at [facility] " click here if you don't have a  MyChart account or go to [email or web address]  Consent: (Patient) Lawrence Cantu provided verbal consent for this virtual visit at the beginning of the encounter.  Current Medications:  Current Outpatient Medications:    amoxicillin-clavulanate (AUGMENTIN) 875-125 MG tablet, Take 1 tablet by mouth 2 (two) times daily., Disp: 20 tablet, Rfl: 0   benzonatate (TESSALON) 100 MG capsule, Take 1 capsule (100 mg total) by mouth 3 (three) times daily as needed., Disp: 30 capsule, Rfl: 0   predniSONE (DELTASONE) 20 MG tablet, Take 2 tablets (40 mg total) by mouth daily with breakfast., Disp: 10 tablet, Rfl: 0   promethazine-dextromethorphan (PROMETHAZINE-DM) 6.25-15 MG/5ML syrup, Take 5 mLs by mouth 4 (four) times daily as needed., Disp: 118 mL, Rfl: 0   AMBULATORY NON FORMULARY MEDICATION, Diltiazem gel with 2% lidocaine  Apply a pea sized amount into your rectum three times daily for 4 weeks, Disp: 30 g, Rfl: 0   escitalopram (LEXAPRO) 20 MG tablet, TAKE 1 TABLET (20 MG TOTAL) BY MOUTH IN THE MORNING. DX: F32.A, Disp: 90 tablet, Rfl: 0   folic acid (FOLVITE) 1 MG tablet, TAKE 1 TABLET BY MOUTH EVERY DAY FOR 30 DAYS, Disp: , Rfl:    gabapentin (NEURONTIN) 600 MG tablet, Take 600 mg by mouth 3 (three) times daily. Take two tablets 3 times daily, Disp: , Rfl:    inFLIXimab-abda (RENFLEXIS) 100 MG SOLR, Once every 6 weeks infusion, Disp: , Rfl:    loratadine (CLARITIN) 10 MG tablet, Take 10 mg by mouth at bedtime as needed for allergies., Disp:  , Rfl:    Melatonin 10 MG TABS, Take 10 mg by mouth at bedtime., Disp: , Rfl:    methimazole (TAPAZOLE) 5 MG tablet, Take 1 tablet (5 mg total) by mouth daily., Disp: 90 tablet, Rfl: 3   methotrexate (RHEUMATREX) 2.5 MG tablet, Take 20 mg by mouth once a week., Disp: , Rfl:    methylcellulose (CITRUCEL) oral powder, Take 1 packet by mouth daily., Disp: , Rfl:    metoprolol succinate (TOPROL-XL) 25 MG 24 hr tablet, Take 1 tablet (25 mg total) by mouth daily., Disp: 90 tablet, Rfl: 0   morphine (MS CONTIN) 15 MG 12 hr tablet, Take 15 mg by mouth 2 (two) times daily. , Disp: , Rfl:    oxyCODONE (OXY IR/ROXICODONE) 5 MG immediate release tablet, Take 5 mg by mouth in the morning, at noon, and at bedtime. , Disp: , Rfl:    tamsulosin (FLOMAX) 0.4 MG CAPS capsule, TAKE 1 CAPSULE BY MOUTH EVERYDAY AT BEDTIME, Disp: 90 capsule, Rfl: 0   tiZANidine (ZANAFLEX) 2 MG tablet, Take 1 tablet (2 mg total) by mouth 3 (three) times daily as needed for muscle spasms., Disp: 30 tablet, Rfl: 0   UNABLE TO FIND, Med Name: conception xr once daily, Disp: , Rfl:    Medications ordered in this encounter:  Meds ordered this encounter  Medications   amoxicillin-clavulanate (AUGMENTIN) 875-125 MG tablet    Sig: Take 1 tablet by mouth 2 (two) times daily.    Dispense:  20 tablet    Refill:  0    Order Specific Question:   Supervising Provider    Answer:   Merrilee Jansky [[id number]]   predniSONE (DELTASONE) 20 MG tablet    Sig: Take 2 tablets (40 mg total) by mouth daily with breakfast.    Dispense:  10 tablet    Refill:  0    Order Specific Question:   Supervising Provider    Answer:   Merrilee Jansky [[id number]]   promethazine-dextromethorphan (PROMETHAZINE-DM) 6.25-15 MG/5ML syrup    Sig: Take 5 mLs by mouth 4 (four) times daily as needed.    Dispense:  118 mL    Refill:  0    Order Specific Question:   Supervising Provider    Answer:   Merrilee Jansky [[id number]]   benzonatate (TESSALON) 100 MG capsule     Sig: Take 1 capsule (100 mg total) by mouth 3 (three) times daily as needed.    Dispense:  30 capsule    Refill:  0    Order Specific Question:   Supervising Provider    Answer:   Merrilee Jansky [id number]     *If you need refills on other medications prior to your next appointment, please contact your pharmacy*  Follow-Up: Call back or seek an in-person evaluation if the symptoms worsen or if the condition fails to improve as anticipated.  [facility] [phone]  Other Instructions  Respiratory Syncytial Virus Infection, Adult Respiratory syncytial virus (RSV) infection is an infection caused by RSV, a common virus. This virus is similar to viruses that cause the common cold and the flu. RSV infection can affect the nose, throat, windpipe, and lungs (respiratory system). When the infection is severe, it can cause: Bronchiolitis. This condition causes inflammation of the air passages in the lungs (bronchioles). Pneumonia. This condition causes inflammation of the air sacs in the lungs. RSV infection spreads from person to person (is contagious) through droplets from coughs and sneezes (respiratory secretions). This condition is rarely serious when it occurs in adults. What are the causes? This condition is caused by contact with RSV. This can happen by: Breathing respiratory secretions from someone who has the infection. Touching something that has been exposed to the virus (is contaminated) and then touching your mouth, nose, or eyes. Coming in close contact with someone who has this infection. This may happen if you: Hug or kiss. Shake or hold hands. Eat or drink using the same dishes or utensils. What increases the risk? The following factors may make you more likely to develop this condition: Being 31 years of age or older. Having certain health conditions, including: A long-term (chronic) lung condition, such as chronic obstructive pulmonary disease  (COPD). An immune system that is weak. This is your body's defense system. Down syndrome. Heart disease. Working in a hospital or other health care facility. Living in a long-term health care facility. RSV infections are most common from the months of November to April, but they can happen any time of year. What are the signs or symptoms? Symptoms of this condition include: Having a runny nose. Coughing. You may have a cough that brings up mucus (productive cough). Sneezing. Having a fever. Wanting to eat less than usual. Breathing loudly (wheezing). Having shortness of breath. Having fluid build up in the lungs (respiratory distress). How is this diagnosed? This condition may be diagnosed based on: Your symptoms. Your medical history. A physical exam. A chest X-ray to rule  out pneumonia. Blood tests or tests of mucus from your lungs (sputum). These tests may be done for older adults. A test of a sample of your respiratory secretions. How is this treated? In most cases, the RSV infection will go away after 1-2 weeks of caring for yourself at home.  Sometimes, RSV infection is severe and can cause bronchiolitis or pneumonia. If you develop one or both of these conditions, you may need to be treated in the hospital. You may be given: Oxygen therapy. Antiviral medicine. Medicines to open your bronchioles (bronchodilators). Follow these instructions at home: Medicines Take over-the-counter and prescription medicines only as told by your health care provider. If you were prescribed an antiviral medicine, take it as told by your health care provider. Do not stop using the antiviral even if you start to feel better. Lifestyle  Eat a healthy diet. Do not drink alcohol. Do not use any products that contain nicotine or tobacco, such as cigarettes, e-cigarettes, and chewing tobacco. If you need help quitting, ask your health care provider. Rest at home until your symptoms go  away. Return to your normal activities as told by your health care provider. Ask your health care provider what activities are safe for you. General instructions  Drink enough fluid to keep your urine pale yellow. Gargle with a salt-water mixture 3-4 times a day or as needed. To make a salt-water mixture, completely dissolve -1 tsp (3-6 g) of salt in 1 cup (237 mL) of warm water. Keep all follow-up visits as told by your health care provider. This is important. How is this prevented? To prevent catching and spreading RSV: Wash your hands often with soap and water for at least 20 seconds. If soap and water are not available, use hand sanitizer. Do not touch your face without first cleaning your hands. Stay home if you have symptoms of the common cold or the flu. Cover your nose and mouth when you cough or sneeze. Avoid large groups of people. Keep a safe distance of about 6 feet (1.8 m) from people who are coughing or sneezing. Where to find more information Centers for Disease Control and Prevention: [employer]Exhibition.com.br Contact a health care provider if: Your symptoms get worse or have not changed after 2 weeks. You have: A fever. Hot flashes, sweating, or chills that keep happening. A cough that brings up much more mucus than usual. A cough that brings up blood. You feel: Very tired (lethargic). Confused. Get help right away if: You have increased or severe trouble breathing. You lose consciousness. These symptoms may represent a serious problem that is an emergency. Do not wait to see if the symptoms will go away. Get medical help right away. Call your local emergency services (911 in the [place] Do not drive yourself to the hospital. Summary Respiratory syncytial virus (RSV) infection is an infection caused by RSV, a common virus. RSV infection can affect the nose, throat, windpipe, and lungs (respiratory system). When the infection is severe, it can cause bronchiolitis or pneumonia. Take  over-the-counter and prescription medicines only as told by your health care provider. Contact a health care provider if your symptoms get worse or have not changed after 2 weeks. This information is not intended to replace advice given to you by your health care provider. Make sure you discuss any questions you have with your health care provider. Document Revised: 04/07/2019 Document Reviewed: 04/14/2019 Elsevier Patient Education  2022 [employer].    If you have been instructed to  have an in-person evaluation today at a local Urgent Care facility, please use the link below. It will take you to a list of all of our available [facility], including address, phone number and hours of operation. Please do not delay care.  [facility]  If you or a family member do not have a primary care provider, use the link below to schedule a visit and establish care. When you choose a [facility] primary care physician or advanced practice provider, you gain a long-term partner in health. Find a Primary Care Provider  Learn more about [facility]'s in-office and virtual care options: [facility]

## 2022-05-15 NOTE — Telephone Encounter (Signed)
Chief Complaint: cough, sore throat Symptoms: yellow nasal discharge, coughing up yellow phlegm, frequent cough, both sides chest sore with coughing and deep breathing, nasal congestion Frequency: yesterday Pertinent Negatives: Patient denies fever,  Disposition: [] ED /[] Urgent Care (no appt availability in office) / [] Appointment(In office/virtual)/ [x]  [facility]/ [] Home Care/ [] Refused Recommended Disposition /[] [facility]/ []  Follow-up with PCP Additional Notes: pt is on biologics for multiple autoimmune disorders, has sarcoidosis. No appts today or tomorrow in office.  Reason for Disposition  [1] Known COPD or other severe lung disease (i.e., bronchiectasis, cystic fibrosis, lung surgery) AND [2] worsening symptoms (i.e., increased sputum purulence or amount, increased breathing difficulty  Answer Assessment - Initial Assessment Questions 1. LOCATION: "Where does it hurt?"       Both sides 2. RADIATION: "Does the pain go anywhere else?" (e.g., into neck, jaw, arms, back)     no 3. ONSET: "When did the chest pain begin?" (Minutes, hours or days)      yest 4. PATTERN: "Does the pain come and go, or has it been constant since it started?"  "Does it get worse with exertion?"      Comes and goes 5. DURATION: "How long does it last" (e.g., seconds, minutes, hours)     *No Answer* 6. SEVERITY: "How bad is the pain?"  (e.g., Scale 1-10; mild, moderate, or severe)    - MILD (1-3): doesn't interfere with normal activities     - MODERATE (4-7): interferes with normal activities or awakens from sleep    - SEVERE (8-10): excruciating pain, unable to do any normal activities       *No Answer* 7. CARDIAC RISK FACTORS: "Do you have any history of heart problems or risk factors for heart disease?" (e.g., angina, prior heart attack; diabetes, high blood pressure, high cholesterol, smoker, or strong family history of heart disease)     *No Answer* 8. PULMONARY RISK FACTORS:  "Do you have any history of lung disease?"  (e.g., blood clots in lung, asthma, emphysema, birth control pills)     *No Answer* 9. CAUSE: "What do you think is causing the chest pain?"     *No Answer* 10. OTHER SYMPTOMS: "Do you have any other symptoms?" (e.g., dizziness, nausea, vomiting, sweating, fever, difficulty breathing, cough)       SOB at baseline has sarcoidosis, nasal congestion,  11. PREGNANCY: "Is there any chance you are pregnant?" "When was your last menstrual period?"       *No Answer*  Answer Assessment - Initial Assessment Questions 1. ONSET: "When did the cough begin?"      Sunday  2. SEVERITY: "How bad is the cough today?"      Frequent  3. SPUTUM: "Describe the color of your sputum" (none, dry cough; clear, white, yellow, green)     Bright yellow 4. HEMOPTYSIS: "Are you coughing up any blood?" If so ask: "How much?" (flecks, streaks, tablespoons, etc.)     no 5. DIFFICULTY BREATHING: "Are you having difficulty breathing?" If Yes, ask: "How bad is it?" (e.g., mild, moderate, severe)    - MILD: No SOB at rest, mild SOB with walking, speaks normally in sentences, can lie down, no retractions, pulse < 100.    - MODERATE: SOB at rest, SOB with minimal exertion and prefers to sit, cannot lie down flat, speaks in phrases, mild retractions, audible wheezing, pulse 100-120.    - SEVERE: Very SOB at rest, speaks in single words, struggling to breathe, sitting hunched forward,  retractions, pulse > 120      mild 6. FEVER: "Do you have a fever?" If Yes, ask: "What is your temperature, how was it measured, and when did it start?"     no 7. CARDIAC HISTORY: "Do you have any history of heart disease?" (e.g., heart attack, congestive heart failure)      *No Answer* 8. LUNG HISTORY: "Do you have any history of lung disease?"  (e.g., pulmonary embolus, asthma, emphysema)     sarcoidosis 9. PE RISK FACTORS: "Do you have a history of blood clots?" (or: recent major surgery, recent  prolonged travel, bedridden)     *No Answer* 10. OTHER SYMPTOMS: "Do you have any other symptoms?" (e.g., runny nose, wheezing, chest pain)       Nasal congestion, sore throat, white patch, exposed to RSV, cough and deep breathing with deep. 11. PREGNANCY: "Is there any chance you are pregnant?" "When was your last menstrual period?"       *No Answer* 12. TRAVEL: "Have you traveled out of the country in the last month?" (e.g., travel history, exposures)       *No Answer*  Protocols used: Chest Pain-A-AH, Cough - Acute Productive-A-AH

## 2022-05-15 NOTE — Progress Notes (Signed)
Virtual Visit Consent   Lawrence Cantu, you are scheduled for a virtual visit with a [facility] provider today. Just as with appointments in the office, your consent must be obtained to participate. Your consent will be active for this visit and any virtual visit you may have with one of our providers in the next 365 days. If you have a MyChart account, a copy of this consent can be sent to you electronically.  As this is a virtual visit, video technology does not allow for your provider to perform a traditional examination. This may limit your provider's ability to fully assess your condition. If your provider identifies any concerns that need to be evaluated in person or the need to arrange testing (such as labs, EKG, etc.), we will make arrangements to do so. Although advances in technology are sophisticated, we cannot ensure that it will always work on either your end or our end. If the connection with a video visit is poor, the visit may have to be switched to a telephone visit. With either a video or telephone visit, we are not always able to ensure that we have a secure connection.  By engaging in this virtual visit, you consent to the provision of healthcare and authorize for your insurance to be billed (if applicable) for the services provided during this visit. Depending on your insurance coverage, you may receive a charge related to this service.  I need to obtain your verbal consent now. Are you willing to proceed with your visit today? Lawrence Cantu has provided verbal consent on 05/15/2022 for a virtual visit (video or telephone). Mar Daring, PA-C  Date: 05/15/2022 12:28 PM  Virtual Visit via Video Note   I, Mar Daring, connected with  Lawrence Cantu  ([id number], [birth year]) on 05/15/22 at 12:15 PM EST by a video-enabled telemedicine application and verified that I am speaking with the correct person using two identifiers.  Location: Patient: Virtual Visit Location Patient:  Home Provider: Virtual Visit Location Provider: Home Office   I discussed the limitations of evaluation and management by telemedicine and the availability of in person appointments. The patient expressed understanding and agreed to proceed.    History of Present Illness: Lawrence Cantu is a 36 y.o. who identifies as a male who was assigned male at birth, and is being seen today for URI symptoms.  HPI: URI  This is a new problem. The current episode started in the past 7 days (exposed to RSV over the weekend; afterwards symptoms started shortly after that). The problem has been gradually worsening. Maximum temperature: subjective fevers. Associated symptoms include congestion, coughing, headaches, nausea (first day; but improved now), a plugged ear sensation, rhinorrhea, sinus pain and a sore throat. Pertinent negatives include no diarrhea, ear pain, vomiting or wheezing. Treatments tried: dayquil, nyquil, cough drops, chloraseptic spray. The treatment provided no relief.     Problems:  Patient Active Problem List   Diagnosis Date Noted   Graves disease 03/28/2022   Body mass index (BMI) 31.0-31.9, adult 10/09/2021   Body mass index (BMI) 34.0-34.9, adult 10/09/2021   Overweight 10/09/2021   Hyperthyroidism 07/18/2021   Depression 05/17/2021   Constipation due to outlet dysfunction    Dyssynergic defecation    Rectal abnormality    Suspected sleep apnea 12/19/2020   Sacroiliitis, not elsewhere classified ([place]) 12/05/2020   Obesity 12/05/2020   DDD (degenerative disc disease), cervical 12/05/2020   Spinal cord stimulator status 09/25/2020   Dyspnea 04/20/2020   Sarcoidosis of  lung ([place]) 04/20/2020   Ankylosing spondylitis lumbar region [facility]) 01/04/2020   Thoracic spine pain 01/04/2020   HLA B27 positive 12/27/2019   Cervical radiculitis 12/18/2019   Chronic pain syndrome 12/18/2019   Neck pain 12/17/2019   Cervical herniated disc 10/07/2016    Allergies:  Allergies  Allergen  Reactions   Strawberry (Diagnostic) Anaphylaxis   Other Hives    Cat Dander   Medications:  Current Outpatient Medications:    amoxicillin-clavulanate (AUGMENTIN) 875-125 MG tablet, Take 1 tablet by mouth 2 (two) times daily., Disp: 20 tablet, Rfl: 0   benzonatate (TESSALON) 100 MG capsule, Take 1 capsule (100 mg total) by mouth 3 (three) times daily as needed., Disp: 30 capsule, Rfl: 0   predniSONE (DELTASONE) 20 MG tablet, Take 2 tablets (40 mg total) by mouth daily with breakfast., Disp: 10 tablet, Rfl: 0   promethazine-dextromethorphan (PROMETHAZINE-DM) 6.25-15 MG/5ML syrup, Take 5 mLs by mouth 4 (four) times daily as needed., Disp: 118 mL, Rfl: 0   AMBULATORY NON FORMULARY MEDICATION, Diltiazem gel with 2% lidocaine  Apply a pea sized amount into your rectum three times daily for 4 weeks, Disp: 30 g, Rfl: 0   escitalopram (LEXAPRO) 20 MG tablet, TAKE 1 TABLET (20 MG TOTAL) BY MOUTH IN THE MORNING. DX: F32.A, Disp: 90 tablet, Rfl: 0   folic acid (FOLVITE) 1 MG tablet, TAKE 1 TABLET BY MOUTH EVERY DAY FOR 30 DAYS, Disp: , Rfl:    gabapentin (NEURONTIN) 600 MG tablet, Take 600 mg by mouth 3 (three) times daily. Take two tablets 3 times daily, Disp: , Rfl:    inFLIXimab-abda (RENFLEXIS) 100 MG SOLR, Once every 6 weeks infusion, Disp: , Rfl:    loratadine (CLARITIN) 10 MG tablet, Take 10 mg by mouth at bedtime as needed for allergies., Disp: , Rfl:    Melatonin 10 MG TABS, Take 10 mg by mouth at bedtime., Disp: , Rfl:    methimazole (TAPAZOLE) 5 MG tablet, Take 1 tablet (5 mg total) by mouth daily., Disp: 90 tablet, Rfl: 3   methotrexate (RHEUMATREX) 2.5 MG tablet, Take 20 mg by mouth once a week., Disp: , Rfl:    methylcellulose (CITRUCEL) oral powder, Take 1 packet by mouth daily., Disp: , Rfl:    metoprolol succinate (TOPROL-XL) 25 MG 24 hr tablet, Take 1 tablet (25 mg total) by mouth daily., Disp: 90 tablet, Rfl: 0   morphine (MS CONTIN) 15 MG 12 hr tablet, Take 15 mg by mouth 2 (two) times  daily. , Disp: , Rfl:    oxyCODONE (OXY IR/ROXICODONE) 5 MG immediate release tablet, Take 5 mg by mouth in the morning, at noon, and at bedtime. , Disp: , Rfl:    tamsulosin (FLOMAX) 0.4 MG CAPS capsule, TAKE 1 CAPSULE BY MOUTH EVERYDAY AT BEDTIME, Disp: 90 capsule, Rfl: 0   tiZANidine (ZANAFLEX) 2 MG tablet, Take 1 tablet (2 mg total) by mouth 3 (three) times daily as needed for muscle spasms., Disp: 30 tablet, Rfl: 0   UNABLE TO FIND, Med Name: conception xr once daily, Disp: , Rfl:   Observations/Objective: Patient is well-developed, well-nourished in no acute distress.  Resting comfortably at home.  Head is normocephalic, atraumatic.  No labored breathing.  Speech is clear and coherent with logical content.  Patient is alert and oriented at baseline.    Assessment and Plan: 1. Bacterial upper respiratory infection - amoxicillin-clavulanate (AUGMENTIN) 875-125 MG tablet; Take 1 tablet by mouth 2 (two) times daily.  Dispense: 20 tablet; Refill: 0 -  predniSONE (DELTASONE) 20 MG tablet; Take 2 tablets (40 mg total) by mouth daily with breakfast.  Dispense: 10 tablet; Refill: 0 - promethazine-dextromethorphan (PROMETHAZINE-DM) 6.25-15 MG/5ML syrup; Take 5 mLs by mouth 4 (four) times daily as needed.  Dispense: 118 mL; Refill: 0 - benzonatate (TESSALON) 100 MG capsule; Take 1 capsule (100 mg total) by mouth 3 (three) times daily as needed.  Dispense: 30 capsule; Refill: 0  2. RSV (acute bronchiolitis due to respiratory syncytial virus)  - Suspect RSV but patient high risk for secondary bacterial infections - Will treat with Augmentin, Prednisone - Add tessalon perles and Promethazine DM for cough - Discussed how RSV can cause prolonged illness in adults, especially immunocompromised, so need to monitor closely - Push fluids - Rest - Seek in person evaluation if symptoms worsen or fail to improve  Follow Up Instructions: I discussed the assessment and treatment plan with the patient.  The patient was provided an opportunity to ask questions and all were answered. The patient agreed with the plan and demonstrated an understanding of the instructions.  A copy of instructions were sent to the patient via MyChart unless otherwise noted below.    The patient was advised to call back or seek an in-person evaluation if the symptoms worsen or if the condition fails to improve as anticipated.  Time:  I spent 11 minutes with the patient via telehealth technology discussing the above problems/concerns.    Mar Daring, PA-C

## 2022-05-19 ENCOUNTER — Other Ambulatory Visit: Payer: Self-pay | Admitting: Family Medicine

## 2022-05-19 DIAGNOSIS — F32A Depression, unspecified: Secondary | ICD-10-CM

## 2022-05-29 ENCOUNTER — Encounter: Payer: Self-pay | Admitting: Physician Assistant

## 2022-06-07 ENCOUNTER — Telehealth: Payer: Self-pay | Admitting: *Deleted

## 2022-06-07 ENCOUNTER — Other Ambulatory Visit: Payer: Self-pay | Admitting: Family Medicine

## 2022-06-07 DIAGNOSIS — F32A Depression, unspecified: Secondary | ICD-10-CM

## 2022-06-07 NOTE — Telephone Encounter (Signed)
Courtesy refill. Patient will need an office visit for further refills. Requested Prescriptions  Pending Prescriptions Disp Refills   escitalopram (LEXAPRO) 20 MG tablet [Pharmacy Med Name: ESCITALOPRAM 20 MG TABLET] 90 tablet 0    Sig: TAKE 1 TABLET (20 MG TOTAL) BY MOUTH IN THE MORNING. DX: F32.A     Psychiatry:  Antidepressants - SSRI Failed - 06/07/2022  2:22 PM      Failed - Valid encounter within last 6 months    Recent Outpatient Visits           8 months ago DDD (degenerative disc disease), cervical   Primary Care at [facility], MD   8 months ago Depression, unspecified depression type   Primary Care at [facility], MD   11 months ago Loss of weight   Primary Care at [facility], MD   1 year ago Loss of weight   Primary Care at [facility], Gildardo Pounds, NP   1 year ago History of COVID-19   Primary Care at [facility] [place], NP       Future Appointments             In 3 months Shamleffer, Konrad Dolores, MD [facility]            Passed - Completed PHQ-2 or PHQ-9 in the last 360 days

## 2022-06-07 NOTE — Telephone Encounter (Signed)
Form in provider box to be signed Copied from CRM [id number]. Topic: General - Inquiry >> Jun 06, 2022  4:22 PM Marlow Baars wrote: Reason for CRM: [name] from [facility] says a fax was originally sent in on 11/26 requesting an order for cpap supplies but they never received. He is resending a fax today. Please assist further

## 2022-06-07 NOTE — Telephone Encounter (Signed)
Called pt - [facility] for appt.  

## 2022-06-18 ENCOUNTER — Other Ambulatory Visit: Payer: Self-pay | Admitting: Internal Medicine

## 2022-06-18 DIAGNOSIS — E0590 Thyrotoxicosis, unspecified without thyrotoxic crisis or storm: Secondary | ICD-10-CM

## 2022-06-20 ENCOUNTER — Other Ambulatory Visit: Payer: Self-pay | Admitting: Family Medicine

## 2022-06-20 DIAGNOSIS — S343XXS Injury of cauda equina, sequela: Secondary | ICD-10-CM

## 2022-06-24 ENCOUNTER — Other Ambulatory Visit: Payer: Self-pay | Admitting: *Deleted

## 2022-06-24 DIAGNOSIS — S343XXS Injury of cauda equina, sequela: Secondary | ICD-10-CM

## 2022-06-24 MED ORDER — TAMSULOSIN HCL 0.4 MG PO CAPS: ORAL_CAPSULE | ORAL | 0 refills | Status: DC

## 2022-06-28 ENCOUNTER — Other Ambulatory Visit (INDEPENDENT_AMBULATORY_CARE_PROVIDER_SITE_OTHER): Payer: Medicare Other

## 2022-06-28 DIAGNOSIS — E0590 Thyrotoxicosis, unspecified without thyrotoxic crisis or storm: Secondary | ICD-10-CM | POA: Diagnosis not present

## 2022-06-28 NOTE — Addendum Note (Signed)
Addended by: STONE, Orhan Mayorga I on: 06/28/2022 10:38 AM   Modules accepted: Orders  

## 2022-06-29 LAB — T4, FREE: Free T4: 1.08 ng/dL (ref 0.82–1.77)

## 2022-06-29 LAB — TSH: TSH: 3.64 u[IU]/mL (ref 0.450–4.500)

## 2022-07-03 ENCOUNTER — Other Ambulatory Visit: Payer: Self-pay | Admitting: Family Medicine

## 2022-07-03 DIAGNOSIS — F32A Depression, unspecified: Secondary | ICD-10-CM

## 2022-07-03 NOTE — Telephone Encounter (Signed)
Requested medication (s) are due for refill today: routing for approval  Requested medication (s) are on the active medication list: yes  Last refill:  06/07/22  Future visit scheduled: no  Notes to clinic:  Unable to refill per protocol, courtesy refill already given, routing for provider approval.      Requested Prescriptions  Pending Prescriptions Disp Refills   escitalopram (LEXAPRO) 20 MG tablet [Pharmacy Med Name: ESCITALOPRAM 20 MG TABLET] 90 tablet 1    Sig: TAKE 1 TABLET (20 MG TOTAL) BY MOUTH IN THE MORNING. **NEED OFFICE VISIT     Psychiatry:  Antidepressants - SSRI Failed - 07/03/2022 12:32 PM      Failed - Valid encounter within last 6 months    Recent Outpatient Visits           8 months ago DDD (degenerative disc disease), cervical   Primary Care at [facility], MD   9 months ago Depression, unspecified depression type   Primary Care at [facility], MD   1 year ago Loss of weight   Primary Care at [facility], MD   1 year ago Loss of weight   Primary Care at [facility], Gildardo Pounds, NP   1 year ago History of COVID-19   Primary Care at [facility], Salomon Fick, NP       Future Appointments             In 2 months Shamleffer, Konrad Dolores, MD [facility]            Passed - Completed PHQ-2 or PHQ-9 in the last 360 days

## 2022-07-04 NOTE — Telephone Encounter (Signed)
Order complete. 

## 2022-08-01 ENCOUNTER — Ambulatory Visit (INDEPENDENT_AMBULATORY_CARE_PROVIDER_SITE_OTHER): Payer: Medicare Other | Admitting: Family Medicine

## 2022-08-01 ENCOUNTER — Encounter: Payer: Self-pay | Admitting: Family Medicine

## 2022-08-01 VITALS — BP 106/63 | HR 93 | Temp 98.0°F | Resp 16 | Wt 234.8 lb

## 2022-08-01 DIAGNOSIS — S343XXS Injury of cauda equina, sequela: Secondary | ICD-10-CM

## 2022-08-01 DIAGNOSIS — F32A Depression, unspecified: Secondary | ICD-10-CM

## 2022-08-01 DIAGNOSIS — Z23 Encounter for immunization: Secondary | ICD-10-CM | POA: Diagnosis not present

## 2022-08-01 DIAGNOSIS — R39198 Other difficulties with micturition: Secondary | ICD-10-CM | POA: Diagnosis not present

## 2022-08-01 MED ORDER — ESCITALOPRAM OXALATE 20 MG PO TABS: 20.0000 mg | ORAL_TABLET | Freq: Every day | ORAL | 1 refills | Status: DC

## 2022-08-01 MED ORDER — TAMSULOSIN HCL 0.4 MG PO CAPS: ORAL_CAPSULE | ORAL | 1 refills | Status: DC

## 2022-08-01 MED ORDER — FOLIC ACID 1 MG PO TABS: 1.0000 mg | ORAL_TABLET | Freq: Every day | ORAL | 1 refills | Status: DC

## 2022-08-01 NOTE — Progress Notes (Signed)
Patient is here for their 6 month follow-up Patient has no concerns today Care gaps have been discussed with patient  

## 2022-08-05 ENCOUNTER — Encounter: Payer: Self-pay | Admitting: Family Medicine

## 2022-08-05 NOTE — Progress Notes (Signed)
Established Patient Office Visit  Subjective    Patient ID: Lawrence Cantu, male    DOB: 03-30-1986  Age: 37 y.o. MRN: [id number]  CC:  Chief Complaint  Patient presents with   Follow-up    HPI Lawrence Cantu presents for routine follow up of chroni cmed issues. Patient denies acute complaints or concerns.    Outpatient Encounter Medications as of 08/01/2022  Medication Sig   AMBULATORY NON FORMULARY MEDICATION Diltiazem gel with 2% lidocaine  Apply a pea sized amount into your rectum three times daily for 4 weeks   amoxicillin-clavulanate (AUGMENTIN) 875-125 MG tablet Take 1 tablet by mouth 2 (two) times daily.   benzonatate (TESSALON) 100 MG capsule Take 1 capsule (100 mg total) by mouth 3 (three) times daily as needed.   gabapentin (NEURONTIN) 600 MG tablet Take 600 mg by mouth 3 (three) times daily. Take two tablets 3 times daily   gabapentin (NEURONTIN) 600 MG tablet Take 2 tablets by mouth 3 (three) times daily.   inFLIXimab-abda (RENFLEXIS) 100 MG SOLR Once every 6 weeks infusion   loratadine (CLARITIN) 10 MG tablet Take 10 mg by mouth at bedtime as needed for allergies.   Melatonin 10 MG TABS Take 10 mg by mouth at bedtime.   methimazole (TAPAZOLE) 5 MG tablet Take 1 tablet (5 mg total) by mouth daily.   methotrexate (RHEUMATREX) 2.5 MG tablet Take 20 mg by mouth once a week.   methylcellulose (CITRUCEL) oral powder Take 1 packet by mouth daily.   metoprolol succinate (TOPROL-XL) 25 MG 24 hr tablet Take 1 tablet (25 mg total) by mouth daily.   morphine (MS CONTIN) 15 MG 12 hr tablet Take 15 mg by mouth 2 (two) times daily.    oxyCODONE (OXY IR/ROXICODONE) 5 MG immediate release tablet Take 5 mg by mouth in the morning, at noon, and at bedtime.    predniSONE (DELTASONE) 20 MG tablet Take 2 tablets (40 mg total) by mouth daily with breakfast.   promethazine-dextromethorphan (PROMETHAZINE-DM) 6.25-15 MG/5ML syrup Take 5 mLs by mouth 4 (four) times daily as needed.   tiZANidine  (ZANAFLEX) 2 MG tablet Take 1 tablet (2 mg total) by mouth 3 (three) times daily as needed for muscle spasms.   UNABLE TO FIND Med Name: conception xr once daily   [DISCONTINUED] escitalopram (LEXAPRO) 20 MG tablet TAKE 1 TABLET (20 MG TOTAL) BY MOUTH IN THE MORNING. **NEED OFFICE VISIT   [DISCONTINUED] folic acid (FOLVITE) 1 MG tablet TAKE 1 TABLET BY MOUTH EVERY DAY FOR 30 DAYS   [DISCONTINUED] tamsulosin (FLOMAX) 0.4 MG CAPS capsule TAKE 1 CAPSULE BY MOUTH AT BEDTIME   escitalopram (LEXAPRO) 20 MG tablet Take 1 tablet (20 mg total) by mouth daily.   folic acid (FOLVITE) 1 MG tablet Take 1 tablet (1 mg total) by mouth daily.   tamsulosin (FLOMAX) 0.4 MG CAPS capsule TAKE 1 CAPSULE BY MOUTH AT BEDTIME   No facility-administered encounter medications on file as of 08/01/2022.    Past Medical History:  Diagnosis Date   Ankylosing spondylitis (HCC)    Anxiety    Arthritis    Phreesia 12/05/2019   BPH (benign prostatic hyperplasia)    Cauda equina syndrome (HCC)    Chronic pain    DDD (degenerative disc disease), cervical    DDD (degenerative disc disease), lumbar    Depression    Phreesia 12/05/2019   Enlarged prostate    Hyperlipidemia    Hypertension    pt denies   Neuromuscular disorder (  [place])    Phreesia 12/05/2019   Pneumonia    x 2 as a child, 1 x as an adult   Post laminectomy syndrome    Sarcoidosis    Spinal stenosis     Past Surgical History:  Procedure Laterality Date   ANAL RECTAL MANOMETRY N/A 01/17/2021   Procedure: ANO RECTAL MANOMETRY;  Surgeon: Yetta Flock, MD;  Location: [facility];  Service: Gastroenterology;  Laterality: N/A;   BRONCHIAL WASHINGS  06/13/2020   Procedure: BRONCHIAL WASHINGS;  Surgeon: Collene Gobble, MD;  [facility]: [facility];  Service: Pulmonary;;   CERVICAL FUSION     FINE NEEDLE ASPIRATION  06/13/2020   Procedure: FINE NEEDLE ASPIRATION (FNA) LINEAR;  Surgeon: Collene Gobble, MD;  Location: [facility];  Service:  Pulmonary;;   IR FL GUIDED LOC OF NEEDLE/CATH TIP FOR SPINAL INJECTION LT     has had at least 10 of these   [place]  ?2018   SPINAL FUSION     SPINE SURGERY N/A    Phreesia 12/05/2019   VIDEO BRONCHOSCOPY WITH ENDOBRONCHIAL ULTRASOUND Bilateral 06/13/2020   Procedure: VIDEO BRONCHOSCOPY WITH ENDOBRONCHIAL ULTRASOUND;  Surgeon: Collene Gobble, MD;  Location: [facility] ENDOSCOPY;  Service: Pulmonary;  Laterality: Bilateral;    Family History  Problem Relation Age of Onset   Depression Mother    Breast cancer Mother    Depression Father    Thyroid cancer Father    Depression Sister    Depression Brother    Hypertension Maternal Grandmother    Hyperlipidemia Maternal Grandmother    Heart disease Maternal Grandmother    Atrial fibrillation Maternal Grandmother    Diabetes Maternal Grandmother    Heart attack Maternal Grandfather     Social History   Socioeconomic History   Marital status: Married    Spouse name: Aeronautical engineer   Number of children: 0   Years of education: Not on file   Highest education level: Not on file  Occupational History   Occupation: disabled  Tobacco Use   Smoking status: Former    Packs/day: 0.50    Years: 5.00    Total pack years: 2.50    Types: Cigarettes    Quit date: 04/20/2014    Years since quitting: 8.2   Smokeless tobacco: Never  Vaping Use   Vaping Use: Never used  Substance and Sexual Activity   Alcohol use: Yes    Alcohol/week: 2.0 standard drinks of alcohol    Types: 2 Standard drinks or equivalent per week    Comment: 1 beer a week   Drug use: Never   Sexual activity: Not on file  Other Topics Concern   Not on file  Social History Narrative   Right Handed    Lives in a two story home    Social Determinants of Health   Financial Resource Strain: Low Risk  (09/07/2021)   Overall Financial Resource Strain (CARDIA)    Difficulty of Paying Living Expenses: Not  hard at all  Food Insecurity: No Food Insecurity (09/07/2021)   Hunger Vital Sign    Worried About Running Out of Food in the Last Year: Never true    Ran Out of Food in the Last Year: Never true  Transportation Needs: No Transportation Needs (09/07/2021)   PRAPARE - Hydrologist (Medical): No    Lack of Transportation (Non-Medical): No  Physical Activity: Inactive (09/07/2021)  Exercise Vital Sign    Days of Exercise per Week: 0 days    Minutes of Exercise per Session: 0 min  Stress: No Stress Concern Present (09/07/2021)   [place]    Feeling of Stress : Not at all  Social Connections: Unknown (09/07/2021)   Social Connection and Isolation Panel [NHANES]    Frequency of Communication with Friends and Family: Twice a week    Frequency of Social Gatherings with Friends and Family: Once a week    Attends Religious Services: Patient refused    Marine scientist or Organizations: No    Attends Archivist Meetings: Never    Marital Status: Married  Human resources officer Violence: Not At Risk (09/07/2021)   Humiliation, Afraid, Rape, and Kick questionnaire    Fear of Current or Ex-Partner: No    Emotionally Abused: No    Physically Abused: No    Sexually Abused: No    Review of Systems  All other systems reviewed and are negative.       Objective    BP 106/63   Pulse 93   Temp 98 F (36.7 C) (Oral)   Resp 16   Wt 234 lb 12.8 oz (106.5 kg)   SpO2 95%   BMI 33.69 kg/m   Physical Exam Vitals and nursing note reviewed.  Constitutional:      General: He is not in acute distress. Cardiovascular:     Rate and Rhythm: Normal rate and regular rhythm.  Pulmonary:     Effort: Pulmonary effort is normal.     Breath sounds: Normal breath sounds.  Musculoskeletal:     Comments: Utilizing cane  Neurological:     General: No focal deficit present.     Mental Status: He is alert  and oriented to person, place, and time.  Psychiatric:        Mood and Affect: Mood normal.        Behavior: Behavior normal.         Assessment & Plan:   1. Depression, unspecified depression type Appears stable. Lexapro refilled. Continue  - escitalopram (LEXAPRO) 20 MG tablet; Take 1 tablet (20 mg total) by mouth daily.  Dispense: 90 tablet; Refill: 1  2. Difficulty urinating Flomax refilled.   3. Cauda equina spinal cord injury, sequela As above - tamsulosin (FLOMAX) 0.4 MG CAPS capsule; TAKE 1 CAPSULE BY MOUTH AT BEDTIME  Dispense: 90 capsule; Refill: 1  4. Need for immunization against influenza  - Flu Vaccine QUAD 46mo+IM (Fluarix, Fluzone & Alfiuria Quad PF)    Return in about 6 months (around 01/30/2023) for follow up.   Becky Sax, MD

## 2022-09-02 IMAGING — CT CT L SPINE W/O CM
3 of 4 series · 9 of 33 positions shown, 11 images · non-contrast
Comparison: Lumbar spine radiographs 08/14/2021.  MRI 04/11/2021

CLINICAL DATA: Low back pain radiating down both legs for 7 years.
Ankylosing spondylitis.



[Series 3: l-spine 2.00 br40 s3 (person_name) · axial · 0.32mm/px · z∈[+1331,+1331]mm · 1 of 154 slices shown, 2 images]
[im 77/154  soft-tissue]
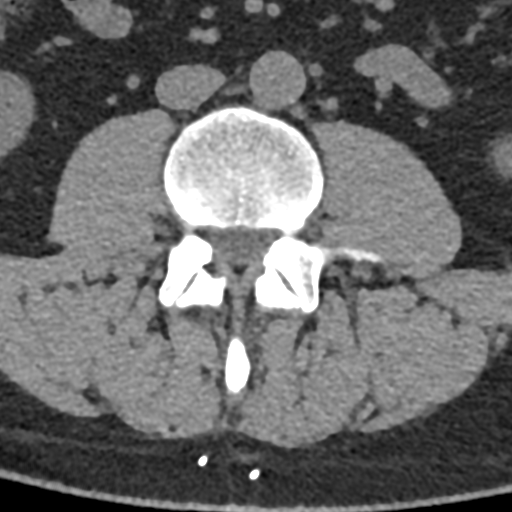
[im 77/154  bone]
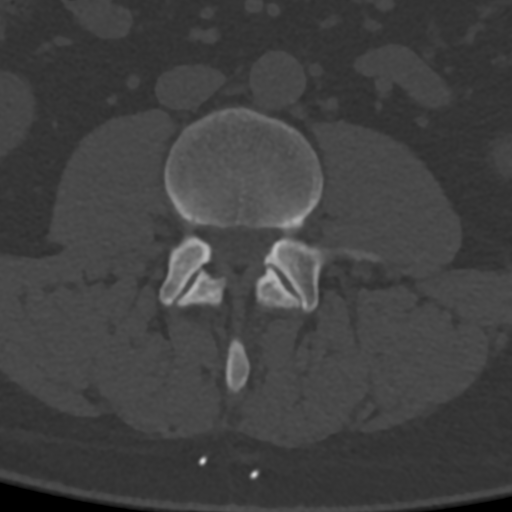

[Series 7: l-spine 2.00 br44 s3 sag soft · sagittal · 0.32mm/px · 5 of 76 slices shown, 6 images]
[im 26/76  bone]
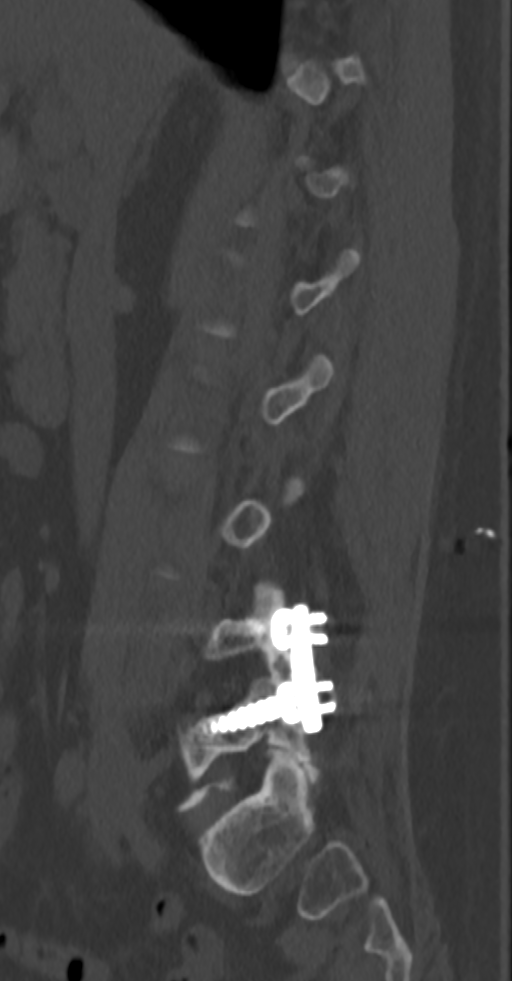
[im 32/76  bone]
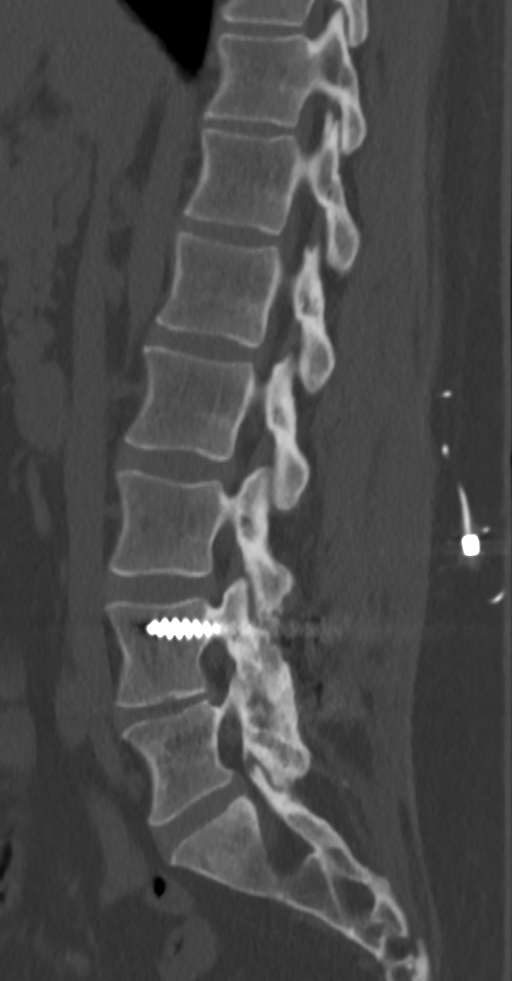
[im 38/76  soft-tissue]
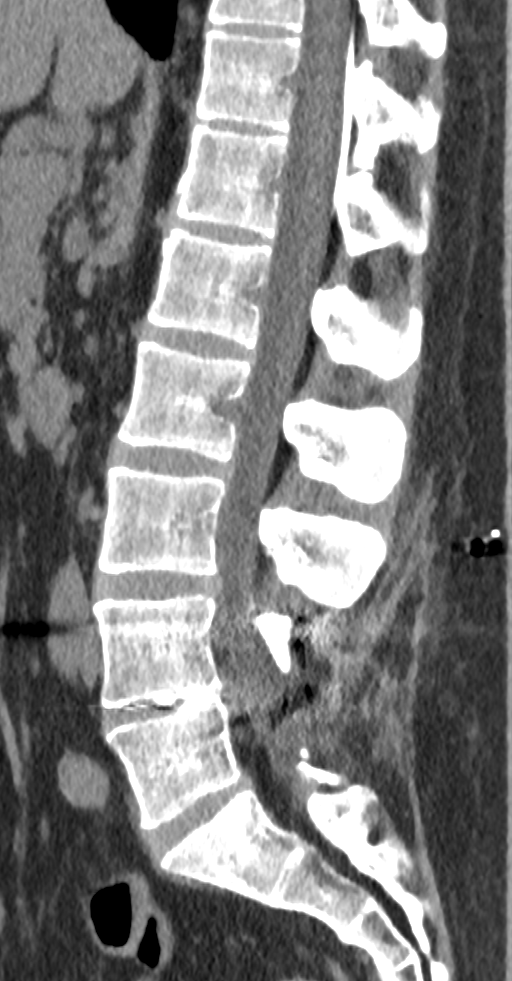
[im 38/76  bone]
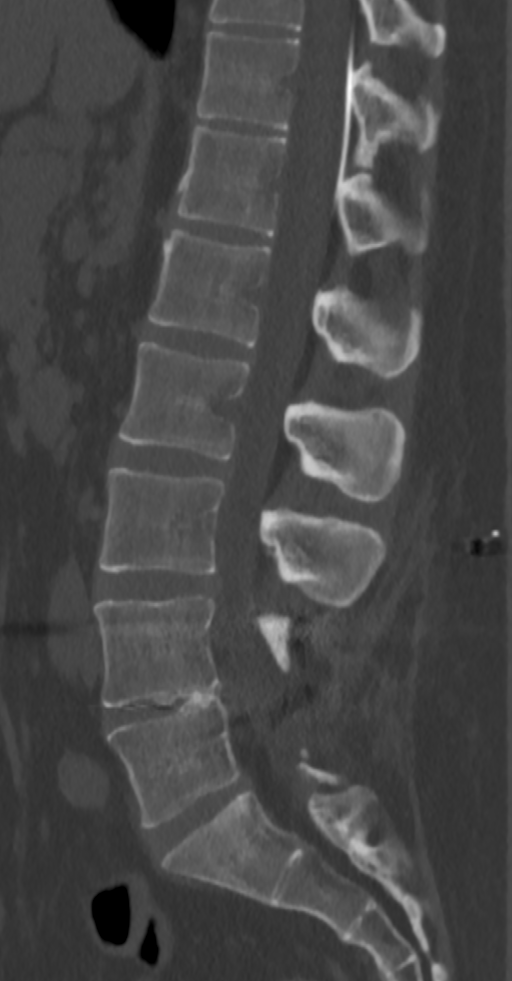
[im 44/76  bone]
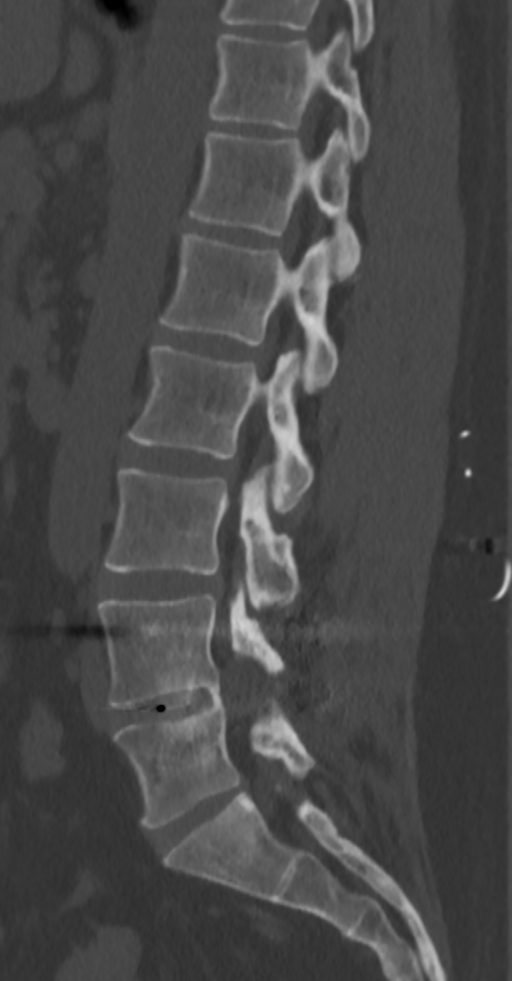
[im 51/76  bone]
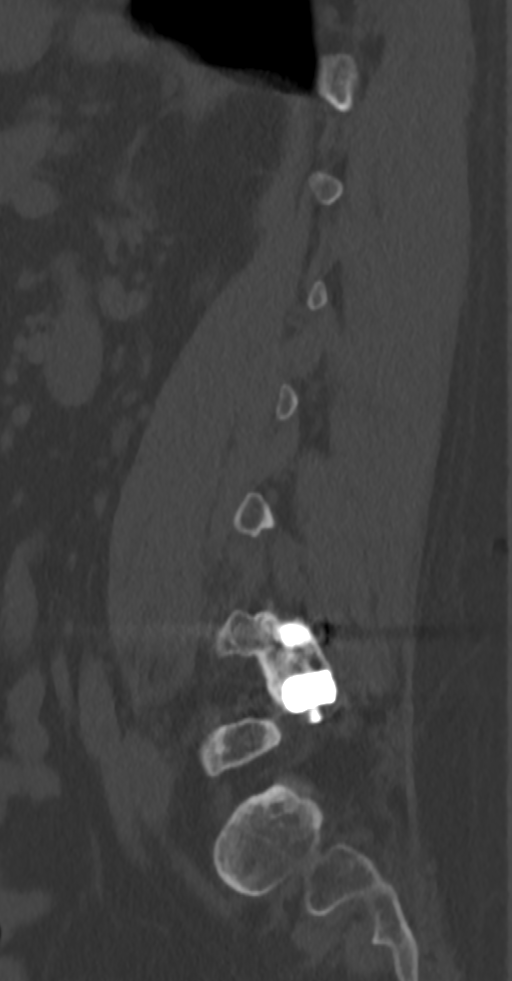

[Series 9: l-spine 2.00 br60 s3 cor bone · coronal · 0.32mm/px · 3 of 79 slices shown]
[im 16/79  bone]
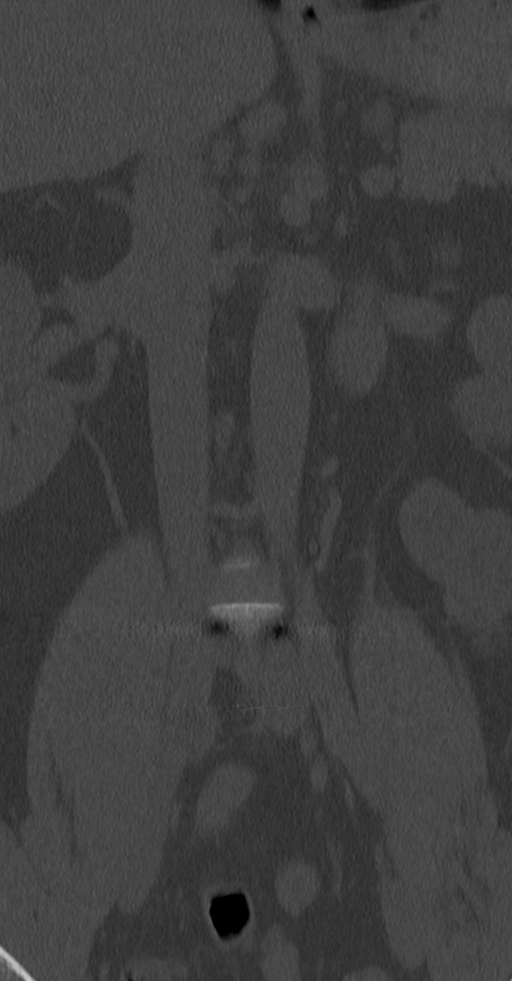
[im 32/79  bone]
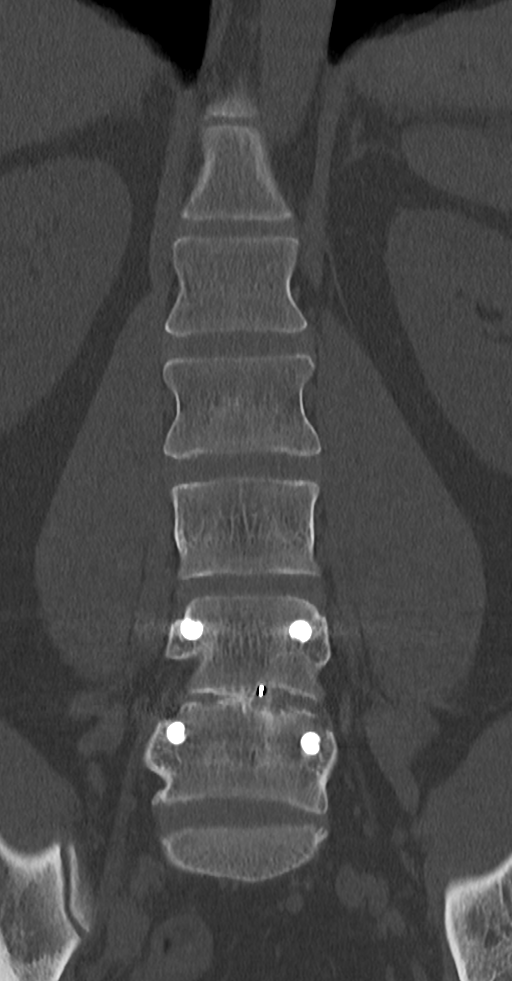
[im 47/79  bone]
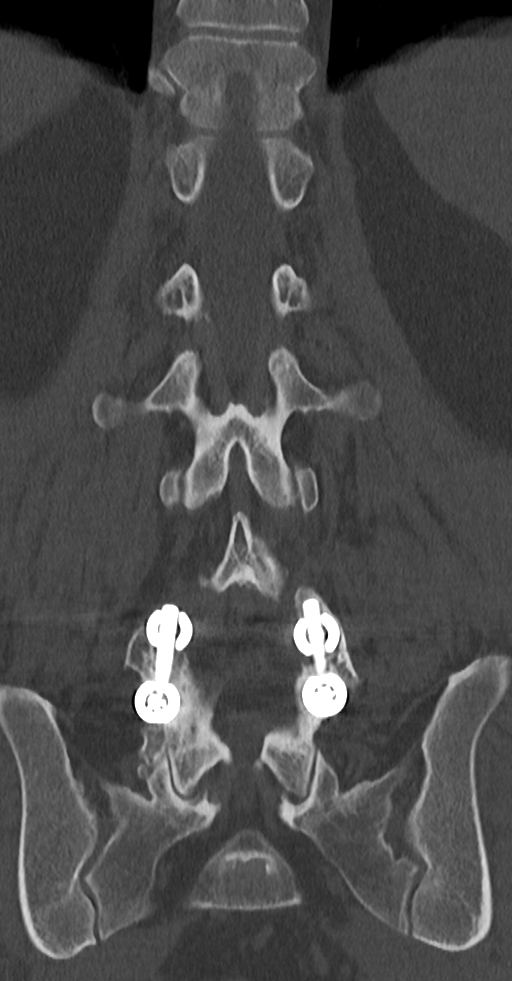

[9 of 33 positions shown; findings below may reference images not displayed]

FINDINGS: Segmentation: 5 lumbar type vertebral bodies.

Alignment: Normal alignment.

Vertebrae: No vertebral compression deformities. No focal bone
lesion or bone destruction. Postoperative changes with posterior
laminectomies at L4 and L5. Posterior rod and screw fixation of L4-5
with L4-5 intervertebral disc prosthesis. Surgical hardware appears
intact without obvious change in position since prior studies. No
lucency at the bone hardware interfaces. Degenerative changes in the
sacroiliac joints with vacuum phenomenon. No sacroiliac joint
ankylosis identified.

Paraspinal and other soft tissues: No abnormal paraspinal soft
tissue mass or infiltration. Normal caliber abdominal aorta. Spinal
stimulator lead tips extending into the thoracic spine, incompletely
visualized.

Disc levels: Narrowed L4-5 disc space with disc prosthesis present.
Intervertebral disc space heights are otherwise normal and appear
patent. No evidence of vertebral ankylosis.
IMPRESSION: 1. Postoperative changes with posterior L4-5 laminectomy and
posterior fixation with intervertebral fusion at L4-5. Surgical
hardware appears intact.
2. No acute abnormalities demonstrated.

## 2022-09-30 ENCOUNTER — Ambulatory Visit (INDEPENDENT_AMBULATORY_CARE_PROVIDER_SITE_OTHER): Payer: Medicare Other | Admitting: Internal Medicine

## 2022-09-30 ENCOUNTER — Encounter: Payer: Self-pay | Admitting: Internal Medicine

## 2022-09-30 VITALS — BP 138/84 | HR 86 | Ht 70.0 in | Wt 233.0 lb

## 2022-09-30 DIAGNOSIS — E0590 Thyrotoxicosis, unspecified without thyrotoxic crisis or storm: Secondary | ICD-10-CM | POA: Diagnosis not present

## 2022-09-30 DIAGNOSIS — E0500 Thyrotoxicosis with diffuse goiter without thyrotoxic crisis or storm: Secondary | ICD-10-CM

## 2022-09-30 LAB — HEPATIC FUNCTION PANEL
ALT: 25 U/L (ref 0–53)
AST: 22 U/L (ref 0–37)
Albumin: 4.7 g/dL (ref 3.5–5.2)
Alkaline Phosphatase: 49 U/L (ref 39–117)
Bilirubin, Direct: 0.1 mg/dL (ref 0.0–0.3)
Total Bilirubin: 0.6 mg/dL (ref 0.2–1.2)
Total Protein: 7.3 g/dL (ref 6.0–8.3)

## 2022-09-30 LAB — CBC WITH DIFFERENTIAL/PLATELET
Basophils Absolute: 0.1 10*3/uL (ref 0.0–0.1)
Basophils Relative: 0.9 % (ref 0.0–3.0)
Eosinophils Absolute: 0.4 10*3/uL (ref 0.0–0.7)
Eosinophils Relative: 5.8 % — ABNORMAL HIGH (ref 0.0–5.0)
HCT: 42.8 % (ref 39.0–52.0)
Hemoglobin: 14.6 g/dL (ref 13.0–17.0)
Lymphocytes Relative: 17.1 % (ref 12.0–46.0)
Lymphs Abs: 1.1 10*3/uL (ref 0.7–4.0)
MCHC: 34 g/dL (ref 30.0–36.0)
MCV: 86.3 fl (ref 78.0–100.0)
Monocytes Absolute: 0.8 10*3/uL (ref 0.1–1.0)
Monocytes Relative: 12.3 % — ABNORMAL HIGH (ref 3.0–12.0)
Neutro Abs: 4.2 10*3/uL (ref 1.4–7.7)
Neutrophils Relative %: 63.9 % (ref 43.0–77.0)
Platelets: 253 10*3/uL (ref 150.0–400.0)
RBC: 4.96 Mil/uL (ref 4.22–5.81)
RDW: 14.6 % (ref 11.5–15.5)
WBC: 6.5 10*3/uL (ref 4.0–10.5)

## 2022-09-30 NOTE — Progress Notes (Unsigned)
Name: Lawrence Cantu  MRN/ DOB: [id number], 09/02/1985    Age/ Sex: 37 y.o., male     PCP: Dorna Mai, MD   Reason for Endocrinology Evaluation: Hyperthyroidism     Initial Endocrinology Clinic Visit: 07/18/2021    PATIENT IDENTIFIER: Lawrence Cantu is a 37 y.o., male with a past medical history of hyperthyroidism, ankylosing spondylosis and sarcoidosis  . He has followed with [facility] since 07/18/2021 for consultative assistance with management of his Hyperthyroidism.   HISTORICAL SUMMARY: The patient was first diagnosed with hyperthyroidism in 2020.  He was started on methimazole at the time   Thyroid ultrasound 06/08/2019 showed mild diffuse heterogeneity of the thyroid gland without discrete nodules  This has been attributes to Graves disease  Father with thyroid cancer . Two  maternal aunts with hypothyroidism    Patient transferred care from Dr. Loanne Drilling in September 2023  SUBJECTIVE:    Today (09/30/2022):  Lawrence Cantu is here for on the management of hyperthyroidism.   Weight has been stable since 1,2024 Denies local neck swelling , but has neck pain  Denies palpitations  Tremors have been improving  He is on Renflexis  for ankylosing spondylitis  Denies diarrhea or loose stools   Last eye exam 09/30/2022 , steroid drops were started due to  iritis    Methimazole 5 mg daily  HISTORY:  Past Medical History:  Past Medical History:  Diagnosis Date   Ankylosing spondylitis ([place])    Anxiety    Arthritis    Phreesia 12/05/2019   BPH (benign prostatic hyperplasia)    Cauda equina syndrome (HCC)    Chronic pain    DDD (degenerative disc disease), cervical    DDD (degenerative disc disease), lumbar    Depression    Phreesia 12/05/2019   Enlarged prostate    Hyperlipidemia    Hypertension    pt denies   Neuromuscular disorder ([place])    Phreesia 12/05/2019   Pneumonia    x 2 as a child, 1 x as an adult   Post laminectomy syndrome     Sarcoidosis    Spinal stenosis    Past Surgical History:  Past Surgical History:  Procedure Laterality Date   ANAL RECTAL MANOMETRY N/A 01/17/2021   Procedure: ANO RECTAL MANOMETRY;  Surgeon: Yetta Flock, MD;  Location: [facility];  Service: Gastroenterology;  Laterality: N/A;   BRONCHIAL WASHINGS  06/13/2020   Procedure: BRONCHIAL WASHINGS;  Surgeon: Collene Gobble, MD;  [facility];  Service: Pulmonary;;   CERVICAL FUSION     FINE NEEDLE ASPIRATION  06/13/2020   Procedure: FINE NEEDLE ASPIRATION (FNA) LINEAR;  Surgeon: Collene Gobble, MD;  [facility]: [facility];  Service: Pulmonary;;   IR FL GUIDED LOC OF NEEDLE/CATH TIP FOR SPINAL INJECTION LT     has had at least 10 of these   [place]  ?2018   SPINAL FUSION     SPINE SURGERY N/A    Phreesia 12/05/2019   VIDEO BRONCHOSCOPY WITH ENDOBRONCHIAL ULTRASOUND Bilateral 06/13/2020   Procedure: VIDEO BRONCHOSCOPY WITH ENDOBRONCHIAL ULTRASOUND;  Surgeon: Collene Gobble, MD;  Location: [facility] ENDOSCOPY;  Service: Pulmonary;  Laterality: Bilateral;   Social History:  reports that he quit smoking about 8 years ago. His smoking use included cigarettes. He has a 2.50 pack-year smoking history. He has never used smokeless tobacco. He reports current alcohol use of about 2.0 standard drinks of  alcohol per week. He reports that he does not use drugs. Family History:  Family History  Problem Relation Age of Onset   Depression Mother    Breast cancer Mother    Depression Father    Thyroid cancer Father    Depression Sister    Depression Brother    Hypertension Maternal Grandmother    Hyperlipidemia Maternal Grandmother    Heart disease Maternal Grandmother    Atrial fibrillation Maternal Grandmother    Diabetes Maternal Grandmother    Heart attack Maternal Grandfather      HOME MEDICATIONS: Allergies as of 09/30/2022       Reactions   Strawberry  (diagnostic) Anaphylaxis   Other Hives   Cat Dander        Medication List        Accurate as of September 30, 2022  1:02 PM. If you have any questions, ask your nurse or doctor.          STOP taking these medications    amoxicillin-clavulanate 875-125 MG tablet Commonly known as: AUGMENTIN Stopped by: Dorita Sciara, MD   benzonatate 100 MG capsule Commonly known as: TESSALON Stopped by: Dorita Sciara, MD   Citrucel oral powder Generic drug: methylcellulose Stopped by: Dorita Sciara, MD   metoprolol succinate 25 MG 24 hr tablet Commonly known as: TOPROL-XL Stopped by: Dorita Sciara, MD   predniSONE 20 MG tablet Commonly known as: DELTASONE Stopped by: Dorita Sciara, MD   promethazine-dextromethorphan 6.25-15 MG/5ML syrup Commonly known as: PROMETHAZINE-DM Stopped by: Dorita Sciara, MD   UNABLE TO FIND Stopped by: Dorita Sciara, MD       TAKE these medications    AMBULATORY NON FORMULARY MEDICATION Diltiazem gel with 2% lidocaine  Apply a pea sized amount into your rectum three times daily for 4 weeks   escitalopram 20 MG tablet Commonly known as: LEXAPRO Take 1 tablet (20 mg total) by mouth daily.   folic acid 1 MG tablet Commonly known as: FOLVITE Take 1 tablet (1 mg total) by mouth daily.   gabapentin 600 MG tablet Commonly known as: NEURONTIN Take 2 tablets by mouth 3 (three) times daily. What changed: Another medication with the same name was removed. Continue taking this medication, and follow the directions you see here. Changed by: Dorita Sciara, MD   loratadine 10 MG tablet Commonly known as: CLARITIN Take 10 mg by mouth at bedtime as needed for allergies.   Melatonin 10 MG Tabs Take 10 mg by mouth at bedtime.   methimazole 5 MG tablet Commonly known as: TAPAZOLE Take 1 tablet (5 mg total) by mouth daily.   methotrexate 2.5 MG tablet Commonly known as: RHEUMATREX Take 20 mg by  mouth once a week.   morphine 15 MG 12 hr tablet Commonly known as: MS CONTIN Take 15 mg by mouth 2 (two) times daily.   oxyCODONE 5 MG immediate release tablet Commonly known as: Oxy IR/ROXICODONE Take 5 mg by mouth in the morning, at noon, and at bedtime.   Renflexis 100 MG Solr Generic drug: inFLIXimab-abda Once every 6 weeks infusion   tamsulosin 0.4 MG Caps capsule Commonly known as: FLOMAX TAKE 1 CAPSULE BY MOUTH AT BEDTIME   tiZANidine 2 MG tablet Commonly known as: ZANAFLEX Take 1 tablet (2 mg total) by mouth 3 (three) times daily as needed for muscle spasms.          OBJECTIVE:   PHYSICAL EXAM: VS: BP 138/84  Pulse 86   Ht 5\' 10"  (1.778 m)   Wt 233 lb (105.7 kg)   SpO2 98%   BMI 33.43 kg/m    EXAM: General: Pt appears well and is in NAD  Eyes: External eye exam normal without stare, lid lag or exophthalmos.  EOM intact.    Neck: General: Supple without adenopathy. Thyroid: Thyroid size normal.  No goiter or nodules appreciated.   Lungs: Clear with good BS bilat with no rales, rhonchi, or wheezes  Heart: Auscultation: RRR.  Extremities:  BL LE: No pretibial edema normal ROM and strength.  Mental Status: Judgment, insight: Intact Orientation: Oriented to time, place, and person Mood and affect: No depression, anxiety, or agitation     DATA REVIEWED:  ***   ASSESSMENT / PLAN / RECOMMENDATIONS:   Hyperthyroidism:  -Clinical scenario consistent with Graves' disease -Ultrasound of the thyroid in the past did not reveal any nodules -We did gust that Graves' disease and autoimmune condition -TFTs today are normal, no change   Medications  Continue methimazole 5 mg daily    2. Graves' Disease:  -No extrathyroidal manifestation of Graves' disease      Signed electronically by: Mack Guise, MD  [facility]  [facility] [address] [place], [place] [address] Phone: [phone] FAX:  [phone]      CC: Dorna Mai, [address] [place] [address] Phone: [phone]  Fax: [phone]   Return to Endocrinology clinic as below: Future Appointments  Date Time Provider [place]  01/27/2023  1:40 PM Dorna Mai, MD [facility] None

## 2022-10-01 LAB — T4, FREE: Free T4: 0.81 ng/dL (ref 0.60–1.60)

## 2022-10-01 LAB — TSH: TSH: 3.12 u[IU]/mL (ref 0.35–5.50)

## 2022-10-01 MED ORDER — METHIMAZOLE 5 MG PO TABS: 5.0000 mg | ORAL_TABLET | ORAL | 3 refills | Status: DC

## 2022-10-28 ENCOUNTER — Other Ambulatory Visit: Payer: Self-pay | Admitting: Neurological Surgery

## 2022-10-28 DIAGNOSIS — M542 Cervicalgia: Secondary | ICD-10-CM

## 2022-11-05 ENCOUNTER — Telehealth: Payer: Self-pay | Admitting: Family Medicine

## 2022-11-05 NOTE — Telephone Encounter (Signed)
Called patient to schedule Medicare Annual Wellness Visit (AWV). Left message for patient to call back and schedule Medicare Annual Wellness Visit (AWV).  Last date of AWV: 09/07/21   If any questions, please contact me at [phone].  Thank you ,  Rudell Cobb AWV direct phone # [phone]

## 2022-11-21 ENCOUNTER — Telehealth: Payer: Self-pay | Admitting: Family Medicine

## 2022-11-21 NOTE — Telephone Encounter (Signed)
Contacted Skeeter Fennel to schedule their annual wellness visit. Appointment made for 11/27/22.  Rudell Cobb AWV direct phone # [phone]

## 2022-11-22 ENCOUNTER — Ambulatory Visit
Admission: RE | Admit: 2022-11-22 | Discharge: 2022-11-22 | Disposition: A | Discharge status: Home / Self Care | Payer: Medicare Other | Source: Ambulatory Visit | Attending: Neurological Surgery | Admitting: Neurological Surgery

## 2022-11-22 DIAGNOSIS — M542 Cervicalgia: Secondary | ICD-10-CM

## 2022-11-27 ENCOUNTER — Ambulatory Visit (INDEPENDENT_AMBULATORY_CARE_PROVIDER_SITE_OTHER): Payer: Medicare Other

## 2022-11-27 VITALS — Ht 70.0 in | Wt 225.0 lb

## 2022-11-27 DIAGNOSIS — Z0000 Encounter for general adult medical examination without abnormal findings: Secondary | ICD-10-CM

## 2022-11-27 NOTE — Progress Notes (Signed)
I connected with  Lawrence Cantu on 11/27/22 by a audio enabled telemedicine application and verified that I am speaking with the correct person using two identifiers.  Patient Location: Home  Provider Location: Office/Clinic  I discussed the limitations of evaluation and management by telemedicine. The patient expressed understanding and agreed to proceed.  Subjective:   Lawrence Cantu is a 37 y.o. male who presents for Medicare Annual/Subsequent preventive examination.  Review of Systems     Cardiac Risk Factors include: advanced age (>72men, >9 women);male gender;obesity (BMI >30kg/m2)     Objective:    Today's Vitals   11/27/22 1042 11/27/22 1043  Weight: 225 lb (102.1 kg)   Height: 5\' 10"  (1.778 m)   PainSc:  7    Body mass index is 32.28 kg/m.     11/27/2022   10:50 AM 11/28/2021    4:14 PM 05/24/2021   10:37 PM 03/23/2021   12:54 PM 06/13/2020    7:27 AM 12/18/2019    3:00 AM  Advanced Directives  Does Patient Have a Medical Advance Directive? No No No No No No  Would patient like information on creating a medical advance directive?  No - Patient declined No - Patient declined   No - Patient declined    Current Medications (verified) Outpatient Encounter Medications as of 11/27/2022  Medication Sig   AMBULATORY NON FORMULARY MEDICATION Diltiazem gel with 2% lidocaine  Apply a pea sized amount into your rectum three times daily for 4 weeks   escitalopram (LEXAPRO) 20 MG tablet Take 1 tablet (20 mg total) by mouth daily.   folic acid (FOLVITE) 1 MG tablet Take 1 tablet (1 mg total) by mouth daily.   gabapentin (NEURONTIN) 600 MG tablet Take 2 tablets by mouth 3 (three) times daily.   inFLIXimab-abda (RENFLEXIS) 100 MG SOLR Once every 6 weeks infusion   loratadine (CLARITIN) 10 MG tablet Take 10 mg by mouth at bedtime as needed for allergies.   Melatonin 10 MG TABS Take 10 mg by mouth at bedtime.   methimazole (TAPAZOLE) 5 MG tablet Take 1 tablet (5 mg total) by mouth as  directed. 1 tablet monday through Saturday, none on Sundays   methotrexate (RHEUMATREX) 2.5 MG tablet Take 20 mg by mouth once a week.   morphine (MS CONTIN) 15 MG 12 hr tablet Take 15 mg by mouth 2 (two) times daily.    oxyCODONE (OXY IR/ROXICODONE) 5 MG immediate release tablet Take 5 mg by mouth in the morning, at noon, and at bedtime.    tamsulosin (FLOMAX) 0.4 MG CAPS capsule TAKE 1 CAPSULE BY MOUTH AT BEDTIME   tiZANidine (ZANAFLEX) 2 MG tablet Take 1 tablet (2 mg total) by mouth 3 (three) times daily as needed for muscle spasms.   No facility-administered encounter medications on file as of 11/27/2022.    Allergies (verified) Strawberry (diagnostic) and Other   History: Past Medical History:  Diagnosis Date   Ankylosing spondylitis (HCC)    Anxiety    Arthritis    Phreesia 12/05/2019   BPH (benign prostatic hyperplasia)    Cauda equina syndrome (HCC)    Chronic pain    DDD (degenerative disc disease), cervical    DDD (degenerative disc disease), lumbar    Depression    Phreesia 12/05/2019   Enlarged prostate    Hyperlipidemia    Hypertension    pt denies   Neuromuscular disorder (HCC)    Phreesia 12/05/2019   Pneumonia    x 2 as a child,  1 x as an adult   Post laminectomy syndrome    Sarcoidosis    Spinal stenosis    Past Surgical History:  Procedure Laterality Date   ANAL RECTAL MANOMETRY N/A 01/17/2021   Procedure: ANO RECTAL MANOMETRY;  Surgeon: Benancio Deeds, MD;  Location: [facility];  Service: Gastroenterology;  Laterality: N/A;   BRONCHIAL WASHINGS  06/13/2020   Procedure: BRONCHIAL WASHINGS;  Surgeon: Leslye Peer, MD;  Location: [facility];  Service: Pulmonary;;   CERVICAL FUSION     FINE NEEDLE ASPIRATION  06/13/2020   Procedure: FINE NEEDLE ASPIRATION (FNA) LINEAR;  Surgeon: Leslye Peer, MD;  Location: [facility];  Service: Pulmonary;;   IR FL GUIDED LOC OF NEEDLE/CATH TIP FOR SPINAL INJECTION LT     has had at least 10 of these    LAMINECTOMY AND MICRODISCECTOMY LUMBAR SPINE     SPINAL CORD STIMULATOR IMPLANT  ?2018   SPINAL FUSION     SPINE SURGERY N/A    Phreesia 12/05/2019   VIDEO BRONCHOSCOPY WITH ENDOBRONCHIAL ULTRASOUND Bilateral 06/13/2020   Procedure: VIDEO BRONCHOSCOPY WITH ENDOBRONCHIAL ULTRASOUND;  Surgeon: Leslye Peer, MD;  Location: [facility] ENDOSCOPY;  Service: Pulmonary;  Laterality: Bilateral;   Family History  Problem Relation Age of Onset   Depression Mother    Breast cancer Mother    Depression Father    Thyroid cancer Father    Depression Sister    Depression Brother    Hypertension Maternal Grandmother    Hyperlipidemia Maternal Grandmother    Heart disease Maternal Grandmother    Atrial fibrillation Maternal Grandmother    Diabetes Maternal Grandmother    Heart attack Maternal Grandfather    Social History   Socioeconomic History   Marital status: Married    Spouse name: Scientist, water quality   Number of children: 0   Years of education: Not on file   Highest education level: Not on file  Occupational History   Occupation: disabled  Tobacco Use   Smoking status: Former    Packs/day: 0.50    Years: 5.00    Additional pack years: 0.00    Total pack years: 2.50    Types: Cigarettes    Quit date: 04/20/2014    Years since quitting: 8.6   Smokeless tobacco: Never  Vaping Use   Vaping Use: Never used  Substance and Sexual Activity   Alcohol use: Yes    Alcohol/week: 2.0 standard drinks of alcohol    Types: 2 Standard drinks or equivalent per week    Comment: 1 beer a week   Drug use: Never   Sexual activity: Not on file  Other Topics Concern   Not on file  Social History Narrative   Right Handed    Lives in a two story home    Social Determinants of Health   Financial Resource Strain: Low Risk  (11/27/2022)   Overall Financial Resource Strain (CARDIA)    Difficulty of Paying Living Expenses: Not hard at all  Food Insecurity: No Food Insecurity (11/27/2022)   Hunger Vital Sign     Worried About Running Out of Food in the Last Year: Never true    Ran Out of Food in the Last Year: Never true  Transportation Needs: No Transportation Needs (11/27/2022)   PRAPARE - Administrator, Civil Service (Medical): No    Lack of Transportation (Non-Medical): No  Physical Activity: Inactive (11/27/2022)   Exercise Vital Sign    Days of Exercise per Week: 0  days    Minutes of Exercise per Session: 0 min  Stress: No Stress Concern Present (11/27/2022)   [employer] of Occupational Health - Occupational Stress Questionnaire    Feeling of Stress : Only a little  Social Connections: Unknown (09/07/2021)   Social Connection and Isolation Panel [NHANES]    Frequency of Communication with Friends and Family: Twice a week    Frequency of Social Gatherings with Friends and Family: Once a week    Attends Religious Services: Patient declined    Database administrator or Organizations: No    Attends Engineer, structural: Never    Marital Status: Married    Tobacco Counseling Counseling given: Not Answered   Clinical Intake:  Pre-visit preparation completed: Yes  Pain : 0-10 Pain Score: 7  Pain Type: Chronic pain Pain Location: Back Pain Descriptors / Indicators: Aching, Shooting Pain Onset: More than a month ago Pain Frequency: Constant     Nutritional Status: BMI > 30  Obese Nutritional Risks: None Diabetes: No  How often do you need to have someone help you when you read instructions, pamphlets, or other written materials from your doctor or pharmacy?: 1 - Never  Diabetic? no  Interpreter Needed?: No  Information entered by :: NAllen LPN   Activities of Daily Living    11/27/2022   10:53 AM  In your present state of health, do you have any difficulty performing the following activities:  Hearing? 0  Vision? 1  Difficulty concentrating or making decisions? 0  Walking or climbing stairs? 1  Dressing or bathing? 0  Doing errands,  shopping? 1  Preparing Food and eating ? Y  Using the Toilet? N  In the past six months, have you accidently leaked urine? Y  Do you have problems with loss of bowel control? N  Managing your Medications? N  Managing your Finances? N  Housekeeping or managing your Housekeeping? Y    Patient Care Team: Georganna Skeans, MD as PCP - General (Family Medicine) Glendale Chard, DO as Consulting Physician (Neurology)  Indicate any recent Medical Services you may have received from other than [facility] providers in the past year (date may be approximate).     Assessment:   This is a routine wellness examination for Cort.  Hearing/Vision screen Vision Screening - Comments:: Regular eye exams, [facility]  Dietary issues and exercise activities discussed: Current Exercise Habits: The patient does not participate in regular exercise at present   Goals Addressed             This Visit's Progress    Patient Stated       11/27/2022, working with neurology at [facility]       Depression Screen    11/27/2022   10:52 AM 10/09/2021    3:44 PM 06/15/2021   10:04 AM 03/13/2021   10:41 AM 01/23/2021    4:43 PM 12/19/2020    1:39 PM 03/31/2020   11:00 AM  PHQ 2/9 Scores  PHQ - 2 Score 2 5 4  0 0 0   PHQ- 9 Score 9 19 18   0 0   Exception Documentation       Other- indicate reason in comment box    Fall Risk    11/27/2022   10:50 AM 03/23/2021   12:54 PM 12/19/2020    1:38 PM 12/07/2019    9:36 AM  Fall Risk   Falls in the past year? 1 1 0 0  Comment slipped, shooting pain  Number falls in past yr: 1 1 0 0  Injury with Fall? 0 0 0 0  Risk for fall due to : Impaired mobility;Medication side effect;Impaired vision  No Fall Risks   Follow up Falls prevention discussed;Education provided;Falls evaluation completed  Falls evaluation completed     FALL RISK PREVENTION PERTAINING TO THE HOME:  Any stairs in or around the home? Yes  If so, are there any without handrails? No  Home free of loose  throw rugs in walkways, pet beds, electrical cords, etc? Yes  Adequate lighting in your home to reduce risk of falls? Yes   ASSISTIVE DEVICES UTILIZED TO PREVENT FALLS:  Life alert? No  Use of a cane, walker or w/c? Yes  Grab bars in the bathroom? Yes  Shower chair or bench in shower? No  Elevated toilet seat or a handicapped toilet? Yes   TIMED UP AND GO:  Was the test performed? No .      Cognitive Function:        11/27/2022   10:55 AM 09/07/2021   12:04 PM  6CIT Screen  What Year? 0 points 0 points  What month? 0 points 0 points  What time? 0 points 0 points  Count back from 20 0 points 0 points  Months in reverse 0 points 0 points  Repeat phrase 0 points 0 points  Total Score 0 points 0 points    Immunizations Immunization History  Administered Date(s) Administered   Influenza Whole 05/15/2020   Influenza,inj,Quad PF,6+ Mos 08/12/2014, 08/01/2022   PFIZER(Purple Top)SARS-COV-2 Vaccination 11/04/2019, 11/29/2019, 12/08/2020    TDAP status: Up to date  Flu Vaccine status: Up to date  Pneumococcal vaccine status: Up to date  Covid-19 vaccine status: Completed vaccines  Qualifies for Shingles Vaccine? No   Zostavax completed  n/a   Shingrix Completed?: n/a  Screening Tests Health Maintenance  Topic Date Due   Hepatitis C Screening  Never done   COVID-19 Vaccine (4 - 2023-24 season) 03/08/2022   INFLUENZA VACCINE  02/06/2023   Medicare Annual Wellness (AWV)  11/27/2023   HIV Screening  Completed   HPV VACCINES  Aged Out   DTaP/Tdap/Td  Discontinued    Health Maintenance  Health Maintenance Due  Topic Date Due   Hepatitis C Screening  Never done   COVID-19 Vaccine (4 - 2023-24 season) 03/08/2022    Colorectal cancer screening: Type of screening: Colonoscopy. Completed 12/22/2020. Repeat every 10 years  Lung Cancer Screening: (Low Dose CT Chest recommended if Age 93-80 years, 30 pack-year currently smoking OR have quit w/in 15years.) does not  qualify.   Lung Cancer Screening Referral: no  Additional Screening:  Hepatitis C Screening: does qualify;   Vision Screening: Recommended annual ophthalmology exams for early detection of glaucoma and other disorders of the eye. Is the patient up to date with their annual eye exam?  Yes  Who is the provider or what is the name of the office in which the patient attends annual eye exams? [facility] If pt is not established with a provider, would they like to be referred to a provider to establish care? No .   Dental Screening: Recommended annual dental exams for proper oral hygiene  Community Resource Referral / Chronic Care Management: CRR required this visit?  No   CCM required this visit?  No      Plan:     I have personally reviewed and noted the following in the patient's chart:   Medical and social  history Use of alcohol, tobacco or illicit drugs  Current medications and supplements including opioid prescriptions. Patient is currently taking opioid prescriptions. Information provided to patient regarding non-opioid alternatives. Patient advised to discuss non-opioid treatment plan with their provider. Functional ability and status Nutritional status Physical activity Advanced directives List of other physicians Hospitalizations, surgeries, and ER visits in previous 12 months Vitals Screenings to include cognitive, depression, and falls Referrals and appointments  In addition, I have reviewed and discussed with patient certain preventive protocols, quality metrics, and best practice recommendations. A written personalized care plan for preventive services as well as general preventive health recommendations were provided to patient.     Barb Merino, LPN   1/61/0960   Nurse Notes: none  Due to this being a virtual visit, the after visit summary with patients personalized plan was offered to patient via mail or my-chart.  Patient would like to access on  my-chart

## 2022-11-27 NOTE — Patient Instructions (Signed)
[name] , Thank you for taking time to come for your Medicare Wellness Visit. I appreciate your ongoing commitment to your health goals. Please review the following plan we discussed and let me know if I can assist you in the future.   These are the goals we discussed:  Goals      Patient Stated     11/27/2022, working with neurology at [facility]        This is a list of the screening recommended for you and due dates:  Health Maintenance  Topic Date Due   Hepatitis C Screening: USPSTF Recommendation to screen - Ages 9-79 yo.  Never done   COVID-19 Vaccine (4 - 2023-24 season) 03/08/2022   Flu Shot  02/06/2023   Medicare Annual Wellness Visit  11/27/2023   HIV Screening  Completed   HPV Vaccine  Aged Out   DTaP/Tdap/Td vaccine  Discontinued    Advanced directives: Advance directive discussed with you today.   Conditions/risks identified: none  Next appointment: Follow up in one year for your annual wellness visit   Preventive Care 38-38 Years Old, Male Preventive care refers to lifestyle choices and visits with your health care provider that can promote health and wellness. Preventive care visits are also called wellness exams. What can I expect for my preventive care visit? Counseling During your preventive care visit, your health care provider may ask about your: Medical history, including: Past medical problems. Family medical history. Current health, including: Emotional well-being. Home life and relationship well-being. Sexual activity. Lifestyle, including: Alcohol, nicotine or tobacco, and drug use. Access to firearms. Diet, exercise, and sleep habits. Safety issues such as seatbelt and bike helmet use. Sunscreen use. Work and work Astronomer. Physical exam Your health care provider may check your: Height and weight. These may be used to calculate your BMI (body mass index). BMI is a measurement that tells if you are at a healthy weight. Waist circumference.  This measures the distance around your waistline. This measurement also tells if you are at a healthy weight and may help predict your risk of certain diseases, such as type 2 diabetes and high blood pressure. Heart rate and blood pressure. Body temperature. Skin for abnormal spots. What immunizations do I need? Vaccines are usually given at various ages, according to a schedule. Your health care provider will recommend vaccines for you based on your age, medical history, and lifestyle or other factors, such as travel or where you work. What tests do I need? Screening Your health care provider may recommend screening tests for certain conditions. This may include: Lipid and cholesterol levels. Diabetes screening. This is done by checking your blood sugar (glucose) after you have not eaten for a while (fasting). Hepatitis B test. Hepatitis C test. HIV (human immunodeficiency virus) test. STI (sexually transmitted infection) testing, if you are at risk. Talk with your health care provider about your test results, treatment options, and if necessary, the need for more tests. Follow these instructions at home: Eating and drinking  Eat a healthy diet that includes fresh fruits and vegetables, whole grains, lean protein, and low-fat dairy products. Drink enough fluid to keep your urine pale yellow. Take vitamin and mineral supplements as recommended by your health care provider. Do not drink alcohol if your health care provider tells you not to drink. If you drink alcohol: Limit how much you have to 0-2 drinks a day. Know how much alcohol is in your drink. In the [place] one drink equals one  12 oz bottle of beer (355 mL), one 5 oz glass of wine (148 mL), or one 1 oz glass of hard liquor (44 mL). Lifestyle Brush your teeth every morning and night with fluoride toothpaste. Floss one time each day. Exercise for at least 30 minutes 5 or more days each week. Do not use any products that contain  nicotine or tobacco. These products include cigarettes, chewing tobacco, and vaping devices, such as e-cigarettes. If you need help quitting, ask your health care provider. Do not use drugs. If you are sexually active, practice safe sex. Use a condom or other form of protection to prevent STIs. Find healthy ways to manage stress, such as: Meditation, yoga, or listening to music. Journaling. Talking to a trusted person. Spending time with friends and family. Minimize exposure to UV radiation to reduce your risk of skin cancer. Safety Always wear your seat belt while driving or riding in a vehicle. Do not drive: If you have been drinking alcohol. Do not ride with someone who has been drinking. If you have been using any mind-altering substances or drugs. While texting. When you are tired or distracted. Wear a helmet and other protective equipment during sports activities. If you have firearms in your house, make sure you follow all gun safety procedures. Seek help if you have been physically or sexually abused. What's next? Go to your health care provider once a year for an annual wellness visit. Ask your health care provider how often you should have your eyes and teeth checked. Stay up to date on all vaccines. This information is not intended to replace advice given to you by your health care provider. Make sure you discuss any questions you have with your health care provider. Document Revised: 12/20/2020 Document Reviewed: 12/20/2020 Elsevier Patient Education  2022 [employer].

## 2022-12-18 ENCOUNTER — Emergency Department (HOSPITAL_COMMUNITY)
Admission: EM | Admit: 2022-12-18 | Discharge: 2022-12-19 | Disposition: A | Discharge status: Home / Self Care | Payer: Medicare Other | Attending: Emergency Medicine | Admitting: Emergency Medicine

## 2022-12-18 ENCOUNTER — Encounter (HOSPITAL_COMMUNITY): Payer: Self-pay | Admitting: *Deleted

## 2022-12-18 ENCOUNTER — Emergency Department (HOSPITAL_COMMUNITY): Payer: Medicare Other

## 2022-12-18 DIAGNOSIS — R079 Chest pain, unspecified: Secondary | ICD-10-CM | POA: Diagnosis not present

## 2022-12-18 DIAGNOSIS — R0602 Shortness of breath: Secondary | ICD-10-CM | POA: Insufficient documentation

## 2022-12-18 DIAGNOSIS — R0600 Dyspnea, unspecified: Secondary | ICD-10-CM

## 2022-12-18 LAB — CBC
HCT: 41.9 % (ref 39.0–52.0)
Hemoglobin: 14.5 g/dL (ref 13.0–17.0)
MCH: 29.7 pg (ref 26.0–34.0)
MCHC: 34.6 g/dL (ref 30.0–36.0)
MCV: 85.9 fL (ref 80.0–100.0)
Platelets: 265 10*3/uL (ref 150–400)
RBC: 4.88 MIL/uL (ref 4.22–5.81)
RDW: 14.1 % (ref 11.5–15.5)
WBC: 7.9 10*3/uL (ref 4.0–10.5)
nRBC: 0 % (ref 0.0–0.2)

## 2022-12-18 LAB — BASIC METABOLIC PANEL
Anion gap: 10 (ref 5–15)
BUN: 13 mg/dL (ref 6–20)
CO2: 27 mmol/L (ref 22–32)
Calcium: 9.4 mg/dL (ref 8.9–10.3)
Chloride: 103 mmol/L (ref 98–111)
Creatinine, Ser: 0.97 mg/dL (ref 0.61–1.24)
GFR, Estimated: >60 mL/min (ref >60)
Glucose, Bld: 96 mg/dL (ref 70–99)
Potassium: 3.7 mmol/L (ref 3.5–5.1)
Sodium: 140 mmol/L (ref 135–145)

## 2022-12-18 LAB — TROPONIN I (HIGH SENSITIVITY): Troponin I (High Sensitivity): 4 ng/L (ref <18)

## 2022-12-18 MED ORDER — DIPHENHYDRAMINE HCL 50 MG/ML IJ SOLN
50.0000 mg | Freq: Once | INTRAMUSCULAR | Status: AC
  Administered 2022-12-19: 50 mg via INTRAVENOUS
  Filled 2022-12-18: qty 1

## 2022-12-18 MED ORDER — FAMOTIDINE IN NACL 20-0.9 MG/50ML-% IV SOLN
20.0000 mg | Freq: Once | INTRAVENOUS | Status: AC
  Administered 2022-12-19: 20 mg via INTRAVENOUS
  Filled 2022-12-18: qty 50

## 2022-12-18 MED ORDER — SODIUM CHLORIDE 0.9 % IV BOLUS
1000.0000 mL | Freq: Once | INTRAVENOUS | Status: AC
  Administered 2022-12-19: 1000 mL via INTRAVENOUS

## 2022-12-18 NOTE — ED Provider Notes (Signed)
[facility] AT [facility] Provider Note   CSN: [id number] Arrival date & time: 12/18/22  1914     History  Chief Complaint  Patient presents with   Chest Pain    Adrion Stickrod is a 37 y.o. male, history of sarcoidosis, ankylosing spondylitis, who presents to the ED secondary to sudden chest pain, shortness of breath that came on about 6 PM today.  He states that he was speaking to his wife, reading something out loud then all of a sudden he felt like his chest felt pressure on it, and he became short of breath.  He states since then it feels like there is some heaviness in his chest, and feels like someone is constricting his breathing.  He has been compliant with his methotrexate.  No history of asthma, or use of inhalers.  Does see pulmonology.  Denies any current chest pain, states that he is still having shortness of breath, die down when he was sitting, but then worse when he was walking.  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  He states that he developed a rash on his legs at, but did not have any shortness of breath at the time.  He states that shortness of breath came multiple hours later.  He states he does not tolerate steroids, as they make him feel very jittery, and like bugs are crawling underneath his skin.    Home Medications Prior to Admission medications   Medication Sig Start Date End Date Taking? Authorizing Provider  AMBULATORY NON FORMULARY MEDICATION Diltiazem gel with 2% lidocaine  Apply a pea sized amount into your rectum three times daily for 4 weeks 11/27/21  Yes Armbruster, Willaim Rayas, MD  escitalopram (LEXAPRO) 20 MG tablet Take 1 tablet (20 mg total) by mouth daily. 08/01/22  Yes Georganna Skeans, MD  fexofenadine (ALLEGRA) 60 MG tablet Take 60 mg by mouth daily as needed for allergies.   Yes [provider]  folic acid (FOLVITE) 1 MG tablet Take  1 tablet (1 mg total) by mouth daily. 08/01/22  Yes Georganna Skeans, MD  gabapentin (NEURONTIN) 600 MG tablet Take 2 tablets by mouth 3 (three) times daily. 07/11/22  Yes [provider]  inFLIXimab-abda (RENFLEXIS) 100 MG SOLR Once every 4 weeks infusion 07/17/20  Yes [provider]  Melatonin 10 MG TABS Take 10 mg by mouth at bedtime.   Yes [provider]  methimazole (TAPAZOLE) 5 MG tablet Take 1 tablet (5 mg total) by mouth as directed. 1 tablet monday through Saturday, none on Sundays 10/01/22  Yes Shamleffer, Konrad Dolores, MD  methotrexate (RHEUMATREX) 2.5 MG tablet Take 20 mg by mouth once a week. 10/02/20  Yes [provider]  morphine (MS CONTIN) 15 MG 12 hr tablet Take 15 mg by mouth 2 (two) times daily.  03/06/20  Yes [provider]  oxyCODONE (OXY IR/ROXICODONE) 5 MG immediate release tablet Take 5 mg by mouth in the morning, at noon, and at bedtime.  03/06/20  Yes [provider]  predniSONE (DELTASONE) 10 MG tablet Take 2 tablets (20 mg total) by mouth daily. 12/19/22  Yes Boyce Keltner L, PA  tamsulosin (FLOMAX) 0.4 MG CAPS capsule TAKE 1 CAPSULE BY MOUTH AT BEDTIME 08/01/22  Yes Georganna Skeans, MD  tiZANidine (ZANAFLEX) 2 MG tablet Take 1 tablet (2 mg total) by mouth 3 (three) times daily as needed for  muscle spasms. 12/23/19  Yes Arvilla Market, MD      Allergies    Strawberry (diagnostic), Other, and Prednisone    Review of Systems   Review of Systems  Respiratory:  Positive for shortness of breath. Negative for cough.   Cardiovascular:  Positive for chest pain.    Physical Exam Updated Vital Signs BP 127/78   Pulse 65   Temp 97.9 F (36.6 C) (Oral)   Resp 13   Ht 5\' 10"  (1.778 m)   Wt 102.1 kg   SpO2 97%   BMI 32.30 kg/m  Physical Exam Vitals and nursing note reviewed.  Constitutional:      General: He is not in acute distress.    Appearance: He is well-developed.  HENT:     Head: Normocephalic and  atraumatic.  Eyes:     Conjunctiva/sclera: Conjunctivae normal.  Cardiovascular:     Rate and Rhythm: Normal rate and regular rhythm.     Heart sounds: No murmur heard. Pulmonary:     Effort: Pulmonary effort is normal. No respiratory distress.     Breath sounds: Normal breath sounds.  Abdominal:     Palpations: Abdomen is soft.     Tenderness: There is no abdominal tenderness.  Musculoskeletal:        General: No swelling.     Cervical back: Neck supple.  Skin:    General: Skin is warm and dry.     Capillary Refill: Capillary refill takes less than 2 seconds.  Neurological:     Mental Status: He is alert.  Psychiatric:        Mood and Affect: Mood normal.     ED Results / Procedures / Treatments   Labs (all labs ordered are listed, but only abnormal results are displayed) Labs Reviewed  BASIC METABOLIC PANEL  CBC  D-DIMER, QUANTITATIVE  TROPONIN I (HIGH SENSITIVITY)  TROPONIN I (HIGH SENSITIVITY)    EKG None  Radiology CT Chest W Contrast  Result Date: 12/19/2022 CLINICAL DATA:  Worsening dyspnea, history of sarcoidosis EXAM: CT CHEST WITH CONTRAST TECHNIQUE: Multidetector CT imaging of the chest was performed during intravenous contrast administration. RADIATION DOSE REDUCTION: This exam was performed according to the departmental dose-optimization program which includes automated exposure control, adjustment of the mA and/or kV according to patient size and/or use of iterative reconstruction technique. CONTRAST:  75mL OMNIPAQUE IOHEXOL 350 MG/ML SOLN COMPARISON:  12/18/2022 plain film FINDINGS: Cardiovascular: Thoracic aorta shows no aneurysmal dilatation or dissection. No cardiac enlargement is noted. No large central pulmonary embolus is seen although not timed for embolus evaluation. Mediastinum/Nodes: Thoracic inlet is within normal limits. No hilar or mediastinal adenopathy is noted. The esophagus is within normal limits. Lungs/Pleura: Lungs are well aerated  bilaterally. No focal infiltrate or effusion is seen. Tiny subpleural 5 mm nodule is noted in the anterior aspect of the right middle lobe. This is stable in appearance from the prior exam. No further follow-up is recommended. No sizable effusion is noted. Subpleural nodule is noted in the lateral aspect of the left upper lobe best seen on image number 55 of series 4 also stable from the prior exam. No further follow-up is recommended. Upper Abdomen: Visualized upper abdomen demonstrates mild fatty infiltration of the liver. No other focal abnormality is seen. Musculoskeletal: Spinal stimulator is noted in the midthoracic spine. No acute rib abnormality is noted. No compression deformity is seen. IMPRESSION: Stable pulmonary nodules bilaterally. No further follow-up is recommended given their long-term stability. Previously seen  Peri lymphatic nodularity is not as well visualized on today's exam. No acute abnormality is noted. Electronically Signed   By: Alcide Clever M.D.   On: 12/19/2022 01:53   DG Chest 2 View  Result Date: 12/18/2022 CLINICAL DATA:  Chest pain with shortness of breath. EXAM: CHEST - 2 VIEW COMPARISON:  Chest radiographs 03/15/2021 and chest CT 05/23/2021 FINDINGS: The cardiomediastinal silhouette is unchanged with normal heart size. There is chronic prominence of the interstitial markings with history of sarcoidosis. There is no evidence of overt pulmonary edema, airspace consolidation, pleural effusion, or pneumothorax. A spinal cord stimulator remains in place. IMPRESSION: Chronic interstitial changes without evidence of acute cardiopulmonary disease. Electronically Signed   By: Sebastian Ache M.D.   On: 12/18/2022 20:52    Procedures Procedures    Medications Ordered in ED Medications  sodium chloride 0.9 % bolus 1,000 mL (0 mLs Intravenous Stopped 12/19/22 0130)  famotidine (PEPCID) IVPB 20 mg premix (0 mg Intravenous Stopped 12/19/22 0057)  diphenhydrAMINE (BENADRYL) injection 50  mg (50 mg Intravenous Given 12/19/22 0018)  budesonide (PULMICORT) nebulizer solution 0.5 mg (0.5 mg Inhalation Given 12/19/22 0228)  iohexol (OMNIPAQUE) 350 MG/ML injection 75 mL (75 mLs Intravenous Contrast Given 12/19/22 0143)    ED Course/ Medical Decision Making/ A&P                             Medical Decision Making Patient is a 37 year old male, history of pulmonary sarcoidosis, who presents to the ED secondary to shortness of breath that came on today.  He does not have a rash on his body, but just does state that he got some redness/rash on his legs after washing his dog with a strawberry shampoo, and he is allergic to strawberries.  Shortness of breath occurred multiple hours after this.  His lung sounds are within normal limits, and he does not appear to be tachypneic on exam.  Given his history of pulmonary sarcoidosis will obtain a chest CT, as well as D-dimer to rule out a PE.  And troponin.  Amount and/or Complexity of Data Reviewed Labs: ordered.    Details: Troponin within normal limits, D-dimer less than 0.27 Radiology: ordered.    Details: Chest CT shows no acute findings ECG/medicine tests:     Details: Normal sinus rhythm Discussion of management or test interpretation with external provider(s): Discussed with patient, he is now better after the medications, but is able to ambulate 100% on room air and is not dyspneic.  We discussed possible prednisone, however he states he has an adverse reaction to this, but would like me to prescribe it anyway, and he may trial it.  I encouraged him to follow-up with his pulmonologist for further evaluation as his workup today was unremarkable.  He voiced understanding discharged home.  Risk Prescription drug management.   Final Clinical Impression(s) / ED Diagnoses Final diagnoses:  Dyspnea, unspecified type    Rx / DC Orders ED Discharge Orders          Ordered    predniSONE (DELTASONE) 10 MG tablet  Daily        12/19/22  0316              Takesha Steger, Harley Alto, PA 12/19/22 0501    Gilda Crease, MD 12/20/22 [phone]

## 2022-12-18 NOTE — ED Triage Notes (Signed)
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

## 2022-12-19 ENCOUNTER — Emergency Department (HOSPITAL_COMMUNITY): Payer: Medicare Other

## 2022-12-19 LAB — TROPONIN I (HIGH SENSITIVITY): Troponin I (High Sensitivity): 3 ng/L (ref <18)

## 2022-12-19 LAB — D-DIMER, QUANTITATIVE: D-Dimer, Quant: <0.27 ug/mL-FEU (ref 0.00–0.50)

## 2022-12-19 MED ORDER — BUDESONIDE 0.5 MG/2ML IN SUSP
0.5000 mg | Freq: Once | RESPIRATORY_TRACT | Status: AC
  Administered 2022-12-19: 0.5 mg via RESPIRATORY_TRACT
  Filled 2022-12-19: qty 2

## 2022-12-19 MED ORDER — IOHEXOL 350 MG/ML SOLN
75.0000 mL | Freq: Once | INTRAVENOUS | Status: AC | PRN
  Administered 2022-12-19: 75 mL via INTRAVENOUS

## 2022-12-19 MED ORDER — PREDNISONE 10 MG PO TABS: 20.0000 mg | ORAL_TABLET | Freq: Every day | ORAL | 0 refills | Status: DC

## 2022-12-19 NOTE — Discharge Instructions (Addendum)
I believe that your shortness of breath is likely secondary to your sarcoidosis, your chest CT, and labs were unremarkable today.  I need you to follow-up with your pulmonologist.  We discussed prednisone, given your allergy,/intolerance, you would like to still proceed, and possibly trial.  Please return to the ER if you have sudden shortness of breath, chest pain, rash all over your body, or difficulty breathing.Marland Kitchen

## 2022-12-19 NOTE — ED Notes (Signed)
Pt ambulated while monitoring o2 sats, sats stayed at 100%. No acute distress noted in pt.

## 2022-12-24 ENCOUNTER — Telehealth: Payer: Self-pay | Admitting: *Deleted

## 2022-12-24 NOTE — Telephone Encounter (Signed)
Transition Care Management Unsuccessful Follow-up Telephone Call  Date of discharge and from where:  [place] ed 12/19/2022  Attempts:  1st Attempt  Reason for unsuccessful TCM follow-up call:  Left voice message

## 2022-12-25 ENCOUNTER — Encounter: Payer: Self-pay | Admitting: Adult Health

## 2022-12-25 ENCOUNTER — Ambulatory Visit (INDEPENDENT_AMBULATORY_CARE_PROVIDER_SITE_OTHER): Payer: Medicare Other | Admitting: Adult Health

## 2022-12-25 VITALS — BP 110/70 | HR 100 | Temp 98.3°F | Ht 70.0 in | Wt 231.6 lb

## 2022-12-25 DIAGNOSIS — R0600 Dyspnea, unspecified: Secondary | ICD-10-CM | POA: Diagnosis not present

## 2022-12-25 DIAGNOSIS — D860 Sarcoidosis of lung: Secondary | ICD-10-CM

## 2022-12-25 DIAGNOSIS — G4733 Obstructive sleep apnea (adult) (pediatric): Secondary | ICD-10-CM

## 2022-12-25 MED ORDER — ALBUTEROL SULFATE HFA 108 (90 BASE) MCG/ACT IN AERS: 1.0000 | INHALATION_SPRAY | Freq: Four times a day (QID) | RESPIRATORY_TRACT | 2 refills | Status: AC | PRN

## 2022-12-25 NOTE — Assessment & Plan Note (Signed)
CT chest appears with no active issues. Resolved adenopathy .  Unclear if episode in ER was from allergic reaction vs sarcoid flare . ? Asthma . Check Echo  Check PFT on return  Stable small nodules , does not appear to have parenchymal involvement  Albuterol As needed    Plan  Patient Instructions  Yearly eye exams  Activity as tolerated   Continue on CPAP At bedtime   Bring SD card in for CPAP download.   Albuterol inhaler As needed    Set up for 2 D echo.   Follow up with Dr. Delton Coombes in 3 months with Spirometry with DLCO.

## 2022-12-25 NOTE — Progress Notes (Signed)
@Patient  ID: Lawrence Cantu, male    DOB: 10-09-1985, 37 y.o.   MRN: [id number]  Chief Complaint  Patient presents with   Hospitalization Follow-up    Referring provider: Georganna Skeans, MD  HPI: 37 year old male minimum smoking history seen for pulmonary consult October 2021 for shortness of breath and mediastinal and hilar adenopathy-found to have sarcoidosis (noncaseating granulomas on path from from [facility]) Significant medical history for ankylosing spondylitis and caudal equina syndrome followed by rheumatology. On  w/ Renflexis and Methotrexate. (Previously on Remicade, Humira ),   TEST/EVENTS :  [facility] 06/13/2020, noncaseating granulomas seen station 7.  Fungal culture, AFB culture, bacterial culture all negative and final.  CT chest 05/10/2020 shows stable mediastinal lymphadenopathy, hilar adenopathy stable small left pleural effusion, several right lower lobe nonspecific groundglass nodular opacities.  PFT with mixed disease  Date of HST 01/04/21: AHI 69.9, SpO2 low 80%.  Titration study 05/2021- optimal control on 10cmH2O   12/25/2022 Follow up: Sarcoid , ER follow up  Patient presents for a follow-up visit.  Was recently seen in the emergency room on June 12 for significant shortness of breath, chest tightness and rash.  Last seen in the office December 19, 2020.  Patient says he had been given his dog a bath with a strawberry shampoo.  He is allergic to to strawberries.  Shortly after he started noticed that his breathing was not doing as well.  He had chest tightness.  And broke out with a rash on his lower extremities.  Patient went to the emergency room.  Was given IV Benadryl.  An albuterol nebulizer treatment.  Patient says that his symptoms started to improve by the time he was discharged.  He has had no further symptoms since discharge.  He was given prednisone but says he did not take it he does not like to take steroids as they cause him to be very jittery.  CT chest done in the  emergency room showed no PE.  No adenopathy, clear lungs, stable 5 mm right middle lobe nodule, left upper lobe stable nodule.  Does have intermittent episodes of shortness of breath.  Is concerned about cardiac sarcoid.  Request an echo.  Patient is followed by rheumatology for ankylosing spondylitis currently on Renflexis and methotrexate .  Says overall he is doing okay.   Gets yearly eye exams  Last visit patient was found to have severe sleep apnea.  Home sleep study showed AHI at 69.9 and SpO2 low at 80%.  Patient was started on CPAP.  Patient says he does wear his CPAP most nights.  Uses a nasal mask.  Does feel like it does help him.  Patient has an SD card and says he will bring it back for download    Allergies  Allergen Reactions   Strawberry (Diagnostic) Anaphylaxis   Other Hives    Cat Dander   Prednisone Other (See Comments)    Skin crawling and leg pain, even in small doses    Immunization History  Administered Date(s) Administered   Influenza Whole 05/15/2020   Influenza,inj,Quad PF,6+ Mos 08/12/2014, 08/01/2022   PFIZER(Purple Top)SARS-COV-2 Vaccination 11/04/2019, 11/29/2019, 12/08/2020    Past Medical History:  Diagnosis Date   Ankylosing spondylitis (HCC)    Anxiety    Arthritis    [employer]rees[employer] 12/05/2019   BPH (benign prostatic hyperplasia)    Cauda equina syndrome (HCC)    Chronic pain    DDD (degenerative disc disease), cervical    DDD (degenerative disc disease), lumbar  Depression    [employer]reesia 12/05/2019   Enlarged prostate    Hyperlipidemia    Hypertension    pt denies   Neuromuscular disorder (HCC)    [employer]reesia 12/05/2019   Pneumonia    x 2 as a child, 1 x as an adult   Post laminectomy syndrome    Sarcoidosis    Spinal stenosis     Tobacco History: Social History   Tobacco Use  Smoking Status Former   Packs/day: 0.50   Years: 5.00   Additional pack years: 0.00   Total pack years: 2.50   Types: Cigarettes   Quit date: 04/20/2014    Years since quitting: 8.6  Smokeless Tobacco Never   Counseling given: Not Answered   Outpatient Medications Prior to Visit  Medication Sig Dispense Refill   AMBULATORY NON FORMULARY MEDICATION Diltiazem gel with 2% lidocaine  Apply a pea sized amount into your rectum three times daily for 4 weeks 30 g 0   escitalopram (LEXAPRO) 20 MG tablet Take 1 tablet (20 mg total) by mouth daily. 90 tablet 1   fexofenadine (ALLEGRA) 60 MG tablet Take 60 mg by mouth daily as needed for allergies.     folic acid (FOLVITE) 1 MG tablet Take 1 tablet (1 mg total) by mouth daily. 90 tablet 1   gabapentin (NEURONTIN) 600 MG tablet Take 2 tablets by mouth 3 (three) times daily.     inFLIXimab-abda (RENFLEXIS) 100 MG SOLR Once every 4 weeks infusion     Melatonin 10 MG TABS Take 10 mg by mouth at bedtime.     methimazole (TAPAZOLE) 5 MG tablet Take 1 tablet (5 mg total) by mouth as directed. 1 tablet monday through Saturday, none on Sundays 72 tablet 3   methotrexate (RHEUMATREX) 2.5 MG tablet Take 20 mg by mouth once a week.     morphine (MS CONTIN) 15 MG 12 hr tablet Take 15 mg by mouth 2 (two) times daily.      oxyCODONE (OXY IR/ROXICODONE) 5 MG immediate release tablet Take 5 mg by mouth in the morning, at noon, and at bedtime.      tamsulosin (FLOMAX) 0.4 MG CAPS capsule TAKE 1 CAPSULE BY MOUTH AT BEDTIME 90 capsule 1   tiZANidine (ZANAFLEX) 2 MG tablet Take 1 tablet (2 mg total) by mouth 3 (three) times daily as needed for muscle spasms. 30 tablet 0   predniSONE (DELTASONE) 10 MG tablet Take 2 tablets (20 mg total) by mouth daily. (Patient not taking: Reported on 12/25/2022) 10 tablet 0   No facility-administered medications prior to visit.     Review of Systems:   Constitutional:   No  weight loss, night sweats,  Fevers, chills, fatigue, or  lassitude.  HEENT:   No headaches,  Difficulty swallowing,  Tooth/dental problems, or  Sore throat,                No sneezing, itching, ear ache, nasal  congestion, post nasal drip,   CV:  No chest pain,  Orthopnea, PND, swelling in lower extremities, anasarca, dizziness, palpitations, syncope.   GI  No heartburn, indigestion, abdominal pain, nausea, vomiting, diarrhea, change in bowel habits, loss of appetite, bloody stools.   Resp:   No chest wall deformity  Skin: no rash or lesions.  GU: no dysuria, change in color of urine, no urgency or frequency.  No flank pain, no hematuria   MS:  chronic joint pain     Physical Exam  BP 110/70 (BP Location:  Left Arm, Patient Position: Sitting, Cuff Size: Large)   Pulse 100   Temp 98.3 F (36.8 C) (Oral)   Ht 5\' 10"  (1.778 m)   Wt 231 lb 9.6 oz (105.1 kg)   SpO2 94%   BMI 33.23 kg/m   GEN: A/Ox3; pleasant , NAD, well nourished    HEENT:  [place]/AT,  EACs-clear, TMs-wnl, NOSE-clear, THROAT-clear, no lesions, no postnasal drip or exudate noted.   NECK:  Supple w/ fair ROM; no JVD; normal carotid impulses w/o bruits; no thyromegaly or nodules palpated; no lymphadenopathy.    RESP  Clear  P & A; w/o, wheezes/ rales/ or rhonchi. no accessory muscle use, no dullness to percussion  CARD:  RRR, no m/r/g, no peripheral edema, pulses intact, no cyanosis or clubbing.  GI:   Soft & nt; nml bowel sounds; no organomegaly or masses detected.   Musco: Warm bil, no deformities or joint swelling noted.   Neuro: alert, no focal deficits noted.    Skin: Warm, no lesions or rashes    Lab Results:  CBC    Component Value Date/Time   WBC 7.9 12/18/2022 1950   RBC 4.88 12/18/2022 1950   HGB 14.5 12/18/2022 1950   HGB 14.4 05/16/2021 1112   HCT 41.9 12/18/2022 1950   HCT 42.3 05/16/2021 1112   PLT 265 12/18/2022 1950   PLT 258 05/16/2021 1112   MCV 85.9 12/18/2022 1950   MCV 87 05/16/2021 1112   MCH 29.7 12/18/2022 1950   MCHC 34.6 12/18/2022 1950   RDW 14.1 12/18/2022 1950   RDW 13.7 05/16/2021 1112   LYMPHSABS 1.1 09/30/2022 1319   MONOABS 0.8 09/30/2022 1319   EOSABS 0.4 09/30/2022  1319   BASOSABS 0.1 09/30/2022 1319    BMET    Component Value Date/Time   NA 140 12/18/2022 1950   NA 138 05/16/2021 1112   K 3.7 12/18/2022 1950   CL 103 12/18/2022 1950   CO2 27 12/18/2022 1950   GLUCOSE 96 12/18/2022 1950   BUN 13 12/18/2022 1950   BUN 14 05/16/2021 1112   CREATININE 0.97 12/18/2022 1950   CALCIUM 9.4 12/18/2022 1950   GFRNONAA >60 12/18/2022 1950   GFRAA >60 03/30/2020 1449    BNP    Component Value Date/Time   BNP 20.0 03/30/2020 1755    ProBNP No results found for: "PROBNP"  Imaging: CT Chest W Contrast  Result Date: 12/19/2022 CLINICAL DATA:  Worsening dyspnea, history of sarcoidosis EXAM: CT CHEST WITH CONTRAST TECHNIQUE: Multidetector CT imaging of the chest was performed during intravenous contrast administration. RADIATION DOSE REDUCTION: This exam was performed according to the departmental dose-optimization program which includes automated exposure control, adjustment of the mA and/or kV according to patient size and/or use of iterative reconstruction technique. CONTRAST:  75mL OMNIPAQUE IOHEXOL 350 MG/ML SOLN COMPARISON:  12/18/2022 plain film FINDINGS: Cardiovascular: Thoracic aorta shows no aneurysmal dilatation or dissection. No cardiac enlargement is noted. No large central pulmonary embolus is seen although not timed for embolus evaluation. Mediastinum/Nodes: Thoracic inlet is within normal limits. No hilar or mediastinal adenopathy is noted. The esophagus is within normal limits. Lungs/Pleura: Lungs are well aerated bilaterally. No focal infiltrate or effusion is seen. Tiny subpleural 5 mm nodule is noted in the anterior aspect of the right middle lobe. This is stable in appearance from the prior exam. No further follow-up is recommended. No sizable effusion is noted. Subpleural nodule is noted in the lateral aspect of the left upper lobe best seen on  image number 55 of series 4 also stable from the prior exam. No further follow-up is  recommended. Upper Abdomen: Visualized upper abdomen demonstrates mild fatty infiltration of the liver. No other focal abnormality is seen. Musculoskeletal: Spinal stimulator is noted in the midthoracic spine. No acute rib abnormality is noted. No compression deformity is seen. IMPRESSION: Stable pulmonary nodules bilaterally. No further follow-up is recommended given their long-term stability. Previously seen Peri lymphatic nodularity is not as well visualized on today's exam. No acute abnormality is noted. Electronically Signed   By: Alcide Clever M.D.   On: 12/19/2022 01:53   DG Chest 2 View  Result Date: 12/18/2022 CLINICAL DATA:  Chest pain with shortness of breath. EXAM: CHEST - 2 VIEW COMPARISON:  Chest radiographs 03/15/2021 and chest CT 05/23/2021 FINDINGS: The cardiomediastinal silhouette is unchanged with normal heart size. There is chronic prominence of the interstitial markings with history of sarcoidosis. There is no evidence of overt pulmonary edema, airspace consolidation, pleural effusion, or pneumothorax. A spinal cord stimulator remains in place. IMPRESSION: Chronic interstitial changes without evidence of acute cardiopulmonary disease. Electronically Signed   By: Sebastian Ache M.D.   On: 12/18/2022 20:52         Latest Ref Rng & Units 05/16/2020   12:07 PM  PFT Results  FVC-Pre L 4.44   FVC-Predicted Pre % 79   FVC-Post L 4.45   FVC-Predicted Post % 79   Pre FEV1/FVC % % 85   Post FEV1/FCV % % 85   FEV1-Pre L 3.76   FEV1-Predicted Pre % 83   FEV1-Post L 3.77   DLCO uncorrected ml/min/mmHg 29.15   DLCO UNC% % 88   DLCO corrected ml/min/mmHg 29.15   DLCO COR %Predicted % 88   DLVA Predicted % 104   TLC L 6.02   TLC % Predicted % 84   RV % Predicted % 85     No results found for: "NITRICOXIDE"      Assessment & Plan:   No problem-specific Assessment & Plan notes found for this encounter.     Rubye Oaks, NP 12/25/2022

## 2022-12-25 NOTE — Patient Instructions (Addendum)
Yearly eye exams  Activity as tolerated   Continue on CPAP At bedtime   Bring SD card in for CPAP download.   Albuterol inhaler As needed    Set up for 2 D echo.   Follow up with Dr. Delton Coombes in 3 months with Spirometry with DLCO.

## 2022-12-25 NOTE — Assessment & Plan Note (Signed)
Severe OSA - continue on CPAP  CPAP download   Plan  Patient Instructions  Yearly eye exams  Activity as tolerated   Continue on CPAP At bedtime   Bring SD card in for CPAP download.   Albuterol inhaler As needed    Set up for 2 D echo.   Follow up with Dr. Delton Coombes in 3 months with Spirometry with DLCO.

## 2022-12-25 NOTE — Assessment & Plan Note (Signed)
Check PFT on return  Check Echo .

## 2023-01-15 ENCOUNTER — Ambulatory Visit (HOSPITAL_COMMUNITY)
Admission: RE | Admit: 2023-01-15 | Discharge: 2023-01-15 | Disposition: A | Discharge status: Home / Self Care | Payer: Medicare Other | Source: Ambulatory Visit | Attending: Adult Health | Admitting: Adult Health

## 2023-01-15 DIAGNOSIS — I517 Cardiomegaly: Secondary | ICD-10-CM | POA: Diagnosis not present

## 2023-01-15 DIAGNOSIS — R9431 Abnormal electrocardiogram [ECG] [EKG]: Secondary | ICD-10-CM | POA: Diagnosis not present

## 2023-01-15 DIAGNOSIS — R0602 Shortness of breath: Secondary | ICD-10-CM | POA: Diagnosis present

## 2023-01-15 DIAGNOSIS — R0600 Dyspnea, unspecified: Secondary | ICD-10-CM | POA: Diagnosis not present

## 2023-01-15 DIAGNOSIS — D860 Sarcoidosis of lung: Secondary | ICD-10-CM

## 2023-01-15 DIAGNOSIS — G4730 Sleep apnea, unspecified: Secondary | ICD-10-CM | POA: Diagnosis not present

## 2023-01-15 LAB — ECHOCARDIOGRAM COMPLETE
Area-P 1/2: 3.48 cm2
Calc EF: 57.6 %
S' Lateral: 3.3 cm
Single Plane A2C EF: 53.3 %
Single Plane A4C EF: 57.5 %

## 2023-01-15 NOTE — Progress Notes (Signed)
  Echocardiogram 2D Echocardiogram has been performed.  Janalyn Harder 01/15/2023, 11:04 AM

## 2023-01-16 ENCOUNTER — Telehealth: Payer: Self-pay | Admitting: Adult Health

## 2023-01-16 ENCOUNTER — Other Ambulatory Visit: Payer: Self-pay | Admitting: *Deleted

## 2023-01-16 DIAGNOSIS — R0600 Dyspnea, unspecified: Secondary | ICD-10-CM

## 2023-01-16 DIAGNOSIS — R931 Abnormal findings on diagnostic imaging of heart and coronary circulation: Secondary | ICD-10-CM

## 2023-01-16 NOTE — Telephone Encounter (Signed)
Discussed results in detail . Cards referral pending

## 2023-01-16 NOTE — Progress Notes (Signed)
Called and spoke with patient, advised of results/recommendations per Rubye Oaks NP.  He verbalized understanding.  Referral placed for cardiology.

## 2023-01-16 NOTE — Telephone Encounter (Signed)
PT calling again asking to speak with Tammy. He has questions about results that he just does not understand. His #  is [phone]

## 2023-01-16 NOTE — Telephone Encounter (Signed)
PT states he is ret Ms. Parrett's call. Says she was calling him w/test results. Please call to advise @ [phone]

## 2023-01-27 ENCOUNTER — Encounter: Payer: Self-pay | Admitting: Family Medicine

## 2023-01-27 ENCOUNTER — Ambulatory Visit (INDEPENDENT_AMBULATORY_CARE_PROVIDER_SITE_OTHER): Payer: Medicare Other | Admitting: Family Medicine

## 2023-01-27 VITALS — BP 118/77 | HR 80 | Temp 98.1°F | Resp 16 | Wt 230.8 lb

## 2023-01-27 DIAGNOSIS — S343XXS Injury of cauda equina, sequela: Secondary | ICD-10-CM

## 2023-01-27 DIAGNOSIS — J358 Other chronic diseases of tonsils and adenoids: Secondary | ICD-10-CM

## 2023-01-27 DIAGNOSIS — F32A Depression, unspecified: Secondary | ICD-10-CM

## 2023-01-27 MED ORDER — FOLIC ACID 1 MG PO TABS: 1.0000 mg | ORAL_TABLET | Freq: Every day | ORAL | 1 refills | Status: DC

## 2023-01-27 MED ORDER — ESCITALOPRAM OXALATE 20 MG PO TABS: 20.0000 mg | ORAL_TABLET | Freq: Every day | ORAL | 1 refills | Status: DC

## 2023-01-27 MED ORDER — TAMSULOSIN HCL 0.4 MG PO CAPS
ORAL_CAPSULE | ORAL | 1 refills | Status: DC
Start: 2023-01-27 — End: 2023-07-22

## 2023-01-27 NOTE — Progress Notes (Unsigned)
New Patient Office Visit  Subjective    Patient ID: Lawrence Cantu, male    DOB: 04/10/1986  Age: 37 y.o. MRN: [id number]  CC:  Chief Complaint  Patient presents with   Follow-up    HPI Lawrence Cantu presents to establish care ***  Outpatient Encounter Medications as of 01/27/2023  Medication Sig   albuterol (VENTOLIN HFA) 108 (90 Base) MCG/ACT inhaler Inhale 1-2 puffs into the lungs every 6 (six) hours as needed.   AMBULATORY NON FORMULARY MEDICATION Diltiazem gel with 2% lidocaine  Apply a pea sized amount into your rectum three times daily for 4 weeks   escitalopram (LEXAPRO) 20 MG tablet Take 1 tablet (20 mg total) by mouth daily.   fexofenadine (ALLEGRA) 60 MG tablet Take 60 mg by mouth daily as needed for allergies.   folic acid (FOLVITE) 1 MG tablet Take 1 tablet (1 mg total) by mouth daily.   gabapentin (NEURONTIN) 600 MG tablet Take 2 tablets by mouth 3 (three) times daily.   inFLIXimab-abda (RENFLEXIS) 100 MG SOLR Once every 4 weeks infusion   Melatonin 10 MG TABS Take 10 mg by mouth at bedtime.   methimazole (TAPAZOLE) 5 MG tablet Take 1 tablet (5 mg total) by mouth as directed. 1 tablet monday through Saturday, none on Sundays   methotrexate (RHEUMATREX) 2.5 MG tablet Take 20 mg by mouth once a week.   morphine (MS CONTIN) 15 MG 12 hr tablet Take 15 mg by mouth 2 (two) times daily.    oxyCODONE (OXY IR/ROXICODONE) 5 MG immediate release tablet Take 5 mg by mouth in the morning, at noon, and at bedtime.    tamsulosin (FLOMAX) 0.4 MG CAPS capsule TAKE 1 CAPSULE BY MOUTH AT BEDTIME   tiZANidine (ZANAFLEX) 2 MG tablet Take 1 tablet (2 mg total) by mouth 3 (three) times daily as needed for muscle spasms.   No facility-administered encounter medications on file as of 01/27/2023.    Past Medical History:  Diagnosis Date   Ankylosing spondylitis (HCC)    Anxiety    Arthritis    Phreesia 12/05/2019   BPH (benign prostatic hyperplasia)    Cauda equina syndrome (HCC)     Chronic pain    DDD (degenerative disc disease), cervical    DDD (degenerative disc disease), lumbar    Depression    Phreesia 12/05/2019   Enlarged prostate    Hyperlipidemia    Hypertension    pt denies   Neuromuscular disorder (HCC)    Phreesia 12/05/2019   Pneumonia    x 2 as a child, 1 x as an adult   Post laminectomy syndrome    Sarcoidosis    Spinal stenosis     Past Surgical History:  Procedure Laterality Date   ANAL RECTAL MANOMETRY N/A 01/17/2021   Procedure: ANO RECTAL MANOMETRY;  Surgeon: Benancio Deeds, MD;  Location: [facility];  Service: Gastroenterology;  Laterality: N/A;   BRONCHIAL WASHINGS  06/13/2020   Procedure: BRONCHIAL WASHINGS;  Surgeon: Leslye Peer, MD;  Location: [facility];  Service: Pulmonary;;   CERVICAL FUSION     FINE NEEDLE ASPIRATION  06/13/2020   Procedure: FINE NEEDLE ASPIRATION (FNA) LINEAR;  Surgeon: Leslye Peer, MD;  Location: [facility];  Service: Pulmonary;;   IR FL GUIDED LOC OF NEEDLE/CATH TIP FOR SPINAL INJECTION LT     has had at least 10 of these   LAMINECTOMY AND MICRODISCECTOMY LUMBAR SPINE     SPINAL CORD STIMULATOR IMPLANT  ?2018  SPINAL FUSION     SPINE SURGERY N/A    Phreesia 12/05/2019   VIDEO BRONCHOSCOPY WITH ENDOBRONCHIAL ULTRASOUND Bilateral 06/13/2020   Procedure: VIDEO BRONCHOSCOPY WITH ENDOBRONCHIAL ULTRASOUND;  Surgeon: Leslye Peer, MD;  Location: [facility] END[facility]COPY;  Service: Pulmonary;  Laterality: Bilateral;    Family History  Problem Relation Age of Onset   Depression Mother    Breast cancer Mother    Depression Father    Thyroid cancer Father    Depression Sister    Depression Brother    Hypertension Maternal Grandmother    Hyperlipidemia Maternal Grandmother    Heart disease Maternal Grandmother    Atrial fibrillation Maternal Grandmother    Diabetes Maternal Grandmother    Heart attack Maternal Grandfather     Social History   Socioeconomic History   Marital status: Married     Spouse name: Scientist, water quality   Number of children: 0   Years of education: Not on file   Highest education level: Not on file  Occupational History   Occupation: disabled  Tobacco Use   Smoking status: Former    Current packs/day: 0.00    Average packs/day: 0.5 packs/day for 5.0 years (2.5 ttl pk-yrs)    Types: Cigarettes    Start date: 04/20/2009    Quit date: 04/20/2014    Years since quitting: 8.7   Smokeless tobacco: Never  Vaping Use   Vaping status: Never Used  Substance and Sexual Activity   Alcohol use: Yes    Alcohol/week: 2.0 standard drinks of alcohol    Types: 2 Standard drinks or equivalent per week    Comment: 1 beer a week   Drug use: Never   Sexual activity: Not on file  Other Topics Concern   Not on file  Social History Narrative   Right Handed    Lives in a two story home    Social Determinants of Health   Financial Resource Strain: Low Risk  (11/27/2022)   Overall Financial Resource Strain (CARDIA)    Difficulty of Paying Living Expenses: Not hard at all  Recent Concern: Financial Resource Strain - Medium Risk (10/21/2022)   Received from [facility], [facility]   Overall Financial Resource Strain (CARDIA)    Difficulty of Paying Living Expenses: Somewhat hard  Food Insecurity: No Food Insecurity (11/27/2022)   Hunger Vital Sign    Worried About Running Out of Food in the Last Year: Never true    Ran Out of Food in the Last Year: Never true  Transportation Needs: No Transportation Needs (11/27/2022)   PRAPARE - Administrator, Civil Service (Medical): No    Lack of Transportation (Non-Medical): No  Recent Concern: Transportation Needs - Unmet Transportation Needs (10/21/2022)   Received from [facility], [facility]   [facility] - Transportation    In the past 12 months, has lack of transportation kept you from medical appointments or from getting medications?: No    Lack of  Transportation (Non-Medical): Yes  Physical Activity: Inactive (11/27/2022)   Exercise Vital Sign    Days of Exercise per Week: 0 days    Minutes of Exercise per Session: 0 min  Stress: No Stress Concern Present (11/27/2022)   [employer] of Occupational Health - Occupational Stress Questionnaire    Feeling of Stress : Only a little  Social Connections: Unknown (09/07/2021)   Social Connection and Isolation Panel [NHANES]    Frequency of Communication with Friends  and Family: Twice a week    Frequency of Social Gatherings with Friends and Family: Once a week    Attends Religious Services: Patient declined    Database administrator or Organizations: No    Attends Banker Meetings: Never    Marital Status: Married  Catering manager Violence: Not At Risk (09/07/2021)   Humiliation, Afraid, Rape, and Kick questionnaire    Fear of Current or Ex-Partner: No    Emotionally Abused: No    Physically Abused: No    Sexually Abused: No    ROS      Objective    BP 118/77   Pulse 80   Temp 98.1 F (36.7 C) (Oral)   Resp 16   Wt 230 lb 12.8 oz (104.7 kg)   SpO2 94%   BMI 33.12 kg/m   Physical Exam  {Labs (Optional):23779}    Assessment & Plan:   Problem List Items Addressed This Visit       Other   Depression   Other Visit Diagnoses     Cauda equina spinal cord injury, sequela           No follow-ups on file.   Tommie Raymond, MD

## 2023-01-27 NOTE — Progress Notes (Unsigned)
Patient is here for their 6 month follow-up Patient has no concerns today Care gaps have been discussed with patient  

## 2023-01-29 ENCOUNTER — Encounter: Payer: Self-pay | Admitting: Family Medicine

## 2023-02-06 ENCOUNTER — Telehealth: Payer: Self-pay | Admitting: Adult Health

## 2023-02-06 NOTE — Telephone Encounter (Signed)
Fax received from Dr. Jerolyn Center Abd-El-Barr with [facility] to perform a C3-6 Cervical Fusion on patient.  Patient needs surgery clearance. Surgery is 03/07/23. Patient was seen on 12/25/22. Office protocol is a risk assessment can be sent to surgeon if patient has been seen in 60 days or less.   Sending to [facility] for risk assessment or recommendations if patient needs to be seen in office prior to surgical procedure.

## 2023-02-07 NOTE — Telephone Encounter (Signed)
Patient has an appointment on March 05, 2023 with Dr. Delton Coombes with PFTs-surgical clearance will be done on that visit.

## 2023-02-11 ENCOUNTER — Telehealth: Payer: Self-pay | Admitting: Cardiology

## 2023-02-11 ENCOUNTER — Encounter: Payer: Self-pay | Admitting: Cardiology

## 2023-02-11 ENCOUNTER — Ambulatory Visit: Payer: Medicare Other | Attending: Internal Medicine | Admitting: Cardiology

## 2023-02-11 VITALS — BP 130/82 | HR 85 | Ht 70.0 in | Wt 233.6 lb

## 2023-02-11 DIAGNOSIS — Z01810 Encounter for preprocedural cardiovascular examination: Secondary | ICD-10-CM | POA: Diagnosis not present

## 2023-02-11 NOTE — Telephone Encounter (Signed)
Pre-op risk assessment addressed at today's office visit, 8/6, with Dr. Azucena Cecil. Office note forwarded to requesting office.   Will remove request from pre-op pool.    Lawrence Cantu. , DNP, NP-C  02/11/2023, 12:49 PM [facility] [address] Office [phone] Fax [phone]

## 2023-02-11 NOTE — Progress Notes (Signed)
Cardiology Office Note:    Date:  02/11/2023   ID:  Lawrence Cantu, DOB 1986/03/14, MRN [id number]  PCP:  Georganna Skeans, MD   [facility] Providers Cardiologist:  None     Referring MD: Georganna Skeans, MD   Chief Complaint  Patient presents with   New Patient (Initial Visit)    Referred for abnormal echocardiogram.  Previously seen by cardiology in [place] years ago for feeling of heart "fluttering".  Scheduled for neck surgery in 03/2023 requiring cardiac clearance.      History of Present Illness:    Lawrence Cantu is a 37 y.o. male with a hx of cervical degenerative disc disease, anxiety, s/p laminectomy, spinal fusion presenting for preop evaluation and abnormal cardiac testing.  Neck surgery being planned.  He denies chest pain or shortness of breath, denies smoking, denies any family history of heart disease.  Echocardiogram obtained last month 01/15/2023 was read as being abnormal with LVH and global hypokinesis.  Past Medical History:  Diagnosis Date   Ankylosing spondylitis (HCC)    Anxiety    Arthritis    Phreesia 12/05/2019   BPH (benign prostatic hyperplasia)    Cauda equina syndrome (HCC)    Chronic pain    DDD (degenerative disc disease), cervical    DDD (degenerative disc disease), lumbar    Depression    Phreesia 12/05/2019   Enlarged prostate    Hyperlipidemia    Hypertension    pt denies   Neuromuscular disorder (HCC)    Phreesia 12/05/2019   Pneumonia    x 2 as a child, 1 x as an adult   Post laminectomy syndrome    Sarcoidosis    Spinal stenosis     Past Surgical History:  Procedure Laterality Date   ANAL RECTAL MANOMETRY N/A 01/17/2021   Procedure: ANO RECTAL MANOMETRY;  Surgeon: Benancio Deeds, MD;  Location: [facility];  Service: Gastroenterology;  Laterality: N/A;   BRONCHIAL WASHINGS  06/13/2020   Procedure: BRONCHIAL WASHINGS;  Surgeon: Leslye Peer, MD;  [facility]: [facility];  Service: Pulmonary;;   CERVICAL FUSION      FINE NEEDLE ASPIRATION  06/13/2020   Procedure: FINE NEEDLE ASPIRATION (FNA) LINEAR;  Surgeon: Leslye Peer, MD;  [facility]: [facility];  Service: Pulmonary;;   IR FL GUIDED LOC OF NEEDLE/CATH TIP FOR SPINAL INJECTION LT     has had at least 10 of these   LAMINECTOMY AND MICRODISCECTOMY LUMBAR SPINE     SPINAL CORD STIMULATOR IMPLANT  ?2018   SPINAL FUSION     SPINE SURGERY N/A    Phreesia 12/05/2019   VIDEO BRONCHOSCOPY WITH ENDOBRONCHIAL ULTRASOUND Bilateral 06/13/2020   Procedure: VIDEO BRONCHOSCOPY WITH ENDOBRONCHIAL ULTRASOUND;  Surgeon: Leslye Peer, MD;  Location: [facility] ENDOSCOPY;  Service: Pulmonary;  Laterality: Bilateral;    Current Medications: Current Meds  Medication Sig   albuterol (VENTOLIN HFA) 108 (90 Base) MCG/ACT inhaler Inhale 1-2 puffs into the lungs every 6 (six) hours as needed.   AMBULATORY NON FORMULARY MEDICATION Diltiazem gel with 2% lidocaine  Apply a pea sized amount into your rectum three times daily for 4 weeks   escitalopram (LEXAPRO) 20 MG tablet Take 1 tablet (20 mg total) by mouth daily.   fexofenadine (ALLEGRA) 60 MG tablet Take 60 mg by mouth daily as needed for allergies.   folic acid (FOLVITE) 1 MG tablet Take 1 tablet (1 mg total) by mouth daily.   gabapentin (NEURONTIN) 600 MG tablet Take 2 tablets by  mouth 3 (three) times daily.   inFLIXimab-abda (RENFLEXIS) 100 MG SOLR Once every 4 weeks infusion   Melatonin 10 MG TABS Take 10 mg by mouth at bedtime.   methimazole (TAPAZOLE) 5 MG tablet Take 1 tablet (5 mg total) by mouth as directed. 1 tablet monday through Saturday, none on Sundays   methotrexate (RHEUMATREX) 2.5 MG tablet Take 20 mg by mouth once a week.   morphine (MS CONTIN) 15 MG 12 hr tablet Take 15 mg by mouth 2 (two) times daily.    oxyCODONE (OXY IR/ROXICODONE) 5 MG immediate release tablet Take 5 mg by mouth in the morning, at noon, and at bedtime.    tamsulosin (FLOMAX) 0.4 MG CAPS capsule TAKE 1 CAPSULE BY MOUTH AT BEDTIME    tiZANidine (ZANAFLEX) 2 MG tablet Take 1 tablet (2 mg total) by mouth 3 (three) times daily as needed for muscle spasms.     Allergies:   Strawberry (diagnostic), Other, and Prednisone   Social History   Socioeconomic History   Marital status: Married    Spouse name: Scientist, water quality   Number of children: 0   Years of education: Not on file   Highest education level: Not on file  Occupational History   Occupation: disabled  Tobacco Use   Smoking status: Former    Current packs/day: 0.00    Average packs/day: 0.5 packs/day for 5.0 years (2.5 ttl pk-yrs)    Types: Cigarettes    Start date: 04/20/2009    Quit date: 04/20/2014    Years since quitting: 8.8   Smokeless tobacco: Never  Vaping Use   Vaping status: Never Used  Substance and Sexual Activity   Alcohol use: Yes    Alcohol/week: 2.0 standard drinks of alcohol    Types: 2 Standard drinks or equivalent per week    Comment: 1 beer a week   Drug use: Never   Sexual activity: Not on file  Other Topics Concern   Not on file  Social History Narrative   Right Handed    Lives in a two story home    Social Determinants of Health   Financial Resource Strain: Medium Risk (02/04/2023)   Received from [facility]   Overall Financial Resource Strain (CARDIA)    Difficulty of Paying Living Expenses: Somewhat hard  Food Insecurity: No Food Insecurity (02/04/2023)   Received from [facility]   Hunger Vital Sign    Worried About Running Out of Food in the Last Year: Never true    Ran Out of Food in the Last Year: Never true  Transportation Needs: Unmet Transportation Needs (02/04/2023)   Received from [facility] - Transportation    In the past 12 months, has lack of transportation kept you from medical appointments or from getting medications?: Yes    Lack of Transportation (Non-Medical): Yes  Physical Activity: Inactive (11/27/2022)   Exercise Vital Sign    Days of  Exercise per Week: 0 days    Minutes of Exercise per Session: 0 min  Stress: No Stress Concern Present (11/27/2022)   [employer] of Occupational Health - Occupational Stress Questionnaire    Feeling of Stress : Only a little  Social Connections: Unknown (09/07/2021)   Social Connection and Isolation Panel [NHANES]    Frequency of Communication with Friends and Family: Twice a week    Frequency of Social Gatherings with Friends and Family: Once a week    Attends Religious Services: Patient  declined    Active Member of Clubs or Organizations: No    Attends Banker Meetings: Never    Marital Status: Married     Family History: The patient's family history includes Atrial fibrillation in his maternal grandmother; Breast cancer in his mother; Depression in his brother, father, mother, and sister; Diabetes in his maternal grandmother; Heart attack in his maternal grandfather; Heart disease in his maternal grandmother; Hyperlipidemia in his maternal grandmother; Hypertension in his maternal grandmother; Thyroid cancer in his father.  ROS:   Please see the history of present illness.     All other systems reviewed and are negative.  EKGs/Labs/Other Studies Reviewed:    The following studies were reviewed today:  EKG Interpretation Date/Time:  Tuesday February 11 2023 08:58:14 EDT Ventricular Rate:  83 PR Interval:  132 QRS Duration:  80 QT Interval:  340 QTC Calculation: 399 R Axis:   51  Text Interpretation: Normal sinus rhythm normal ekg Confirmed by Debbe Odea ([id number]) on 02/11/2023 9:16:56 AM    Recent Labs: 09/30/2022: ALT 25; TSH 3.12 12/18/2022: BUN 13; Creatinine, Ser 0.97; Hemoglobin 14.5; Platelets 265; Potassium 3.7; Sodium 140  Recent Lipid Panel No results found for: "CHOL", "TRIG", "HDL", "CHOLHDL", "VLDL", "LDLCALC", "LDLDIRECT"   Risk Assessment/Calculations:             Physical Exam:    VS:  BP 130/82 (BP Location: Left Arm, Patient  Position: Sitting, Cuff Size: Normal)   Pulse 85   Ht 5\' 10"  (1.778 m)   Wt 233 lb 9.6 oz (106 kg)   SpO2 97%   BMI 33.52 kg/m     Wt Readings from Last 3 Encounters:  02/11/23 233 lb 9.6 oz (106 kg)  01/27/23 230 lb 12.8 oz (104.7 kg)  12/25/22 231 lb 9.6 oz (105.1 kg)     GEN:  Well nourished, well developed in no acute distress HEENT: Normal NECK: No JVD; No carotid bruits CARDIAC: RRR, no murmurs, rubs, gallops RESPIRATORY:  Clear to auscultation without rales, wheezing or rhonchi  ABDOMEN: Soft, non-tender, non-distended MUSCULOSKELETAL:  No edema; No deformity  SKIN: Warm and dry NEUROLOGIC:  Alert and oriented x 3 PSYCHIATRIC:  Normal affect   ASSESSMENT:    1. Pre-operative cardiovascular examination    PLAN:    In order of problems listed above:  Preop cardiovascular exam, neck surgery being planned.  Echocardiogram 01/15/2023 reviewed by myself showing normal systolic function EF 55 to 60%, normal diastolic function, normal LV wall thickness no significant valvular abnormalities, normal longitudinal strain.  Okay for patient to proceed with surgical procedure from a cardiac perspective.  EKG today is normal.  Follow-up as needed.      Medication Adjustments/Labs and Tests Ordered: Current medicines are reviewed at length with the patient today.  Concerns regarding medicines are outlined above.  Orders Placed This Encounter  Procedures   EKG 12-Lead   No orders of the defined types were placed in this encounter.   Patient Instructions  Medication Instructions:   Your physician recommends that you continue on your current medications as directed. Please refer to the Current Medication list given to you today.  *If you need a refill on your cardiac medications before your next appointment, please call your pharmacy*   Lab Work:  None Ordered  If you have labs (blood work) drawn today and your tests are completely normal, you will receive your results  only by: MyChart Message (if you have MyChart) OR A paper copy  in the mail If you have any lab test that is abnormal or we need to change your treatment, we will call you to review the results.   Testing/Procedures:  None Ordered   Follow-Up: At [facility], you and your health needs are our priority.  As part of our continuing mission to provide you with exceptional heart care, we have created designated Provider Care Teams.  These Care Teams include your primary Cardiologist (physician) and Advanced Practice Providers (APPs -  Physician Assistants and Nurse Practitioners) who all work together to provide you with the care you need, when you need it.  We recommend signing up for the patient portal called "MyChart".  Sign up information is provided on this After Visit Summary.  MyChart is used to connect with patients for Virtual Visits (Telemedicine).  Patients are able to view lab/test results, encounter notes, upcoming appointments, etc.  Non-urgent messages can be sent to your provider as well.   To learn more about what you can do with MyChart, go to [email or web address].    Your next appointment:    AS NEEDED    Signed, Debbe Odea, MD  02/11/2023 10:04 AM    [facility]

## 2023-02-11 NOTE — Telephone Encounter (Signed)
   Pre-operative Risk Assessment    Patient Name: Lawrence Cantu  DOB: 1986/04/03 MRN: [id number]      Request for Surgical Clearance    Procedure:   C3-C6 PCDF  Date of Surgery:  Clearance 03/07/23                                 Surgeon:  not indicated  Surgeon's Group or Practice Name:  [facility] Phone number:  [phone] Fax number:  [phone]   Type of Clearance Requested:   - Medical    Type of Anesthesia:  Not Indicated   Additional requests/questions:    Courtney Heys   02/11/2023, 11:35 AM

## 2023-02-11 NOTE — Patient Instructions (Signed)
Medication Instructions:   Your physician recommends that you continue on your current medications as directed. Please refer to the Current Medication list given to you today.  *If you need a refill on your cardiac medications before your next appointment, please call your pharmacy*   Lab Work:  None Ordered  If you have labs (blood work) drawn today and your tests are completely normal, you will receive your results only by: MyChart Message (if you have MyChart) OR A paper copy in the mail If you have any lab test that is abnormal or we need to change your treatment, we will call you to review the results.   Testing/Procedures:  None Ordered   Follow-Up: At [facility], you and your health needs are our priority.  As part of our continuing mission to provide you with exceptional heart care, we have created designated Provider Care Teams.  These Care Teams include your primary Cardiologist (physician) and Advanced Practice Providers (APPs -  Physician Assistants and Nurse Practitioners) who all work together to provide you with the care you need, when you need it.  We recommend signing up for the patient portal called "MyChart".  Sign up information is provided on this After Visit Summary.  MyChart is used to connect with patients for Virtual Visits (Telemedicine).  Patients are able to view lab/test results, encounter notes, upcoming appointments, etc.  Non-urgent messages can be sent to your provider as well.   To learn more about what you can do with MyChart, go to [email or web address].    Your next appointment:  AS NEEDED 

## 2023-03-05 ENCOUNTER — Encounter: Payer: Self-pay | Admitting: Emergency Medicine

## 2023-03-05 ENCOUNTER — Ambulatory Visit (INDEPENDENT_AMBULATORY_CARE_PROVIDER_SITE_OTHER): Payer: Medicare Other | Admitting: Emergency Medicine

## 2023-03-05 ENCOUNTER — Ambulatory Visit: Payer: Medicare Other | Admitting: Emergency Medicine

## 2023-03-05 VITALS — BP 118/80 | HR 94 | Ht 70.0 in | Wt 231.0 lb

## 2023-03-05 DIAGNOSIS — D860 Sarcoidosis of lung: Secondary | ICD-10-CM

## 2023-03-05 DIAGNOSIS — G4733 Obstructive sleep apnea (adult) (pediatric): Secondary | ICD-10-CM | POA: Diagnosis not present

## 2023-03-05 DIAGNOSIS — R0600 Dyspnea, unspecified: Secondary | ICD-10-CM

## 2023-03-05 LAB — PULMONARY FUNCTION TEST
DL/VA % pred: 92 %
DL/VA: 4.35 ml/min/mmHg/L
DLCO cor % pred: 91 %
DLCO cor: 29 ml/min/mmHg
DLCO unc % pred: 91 %
DLCO unc: 29 ml/min/mmHg
FEF 25-75 Pre: 3.61 L/s
FEF2575-%Pred-Pre: 87 %
FEV1-%Pred-Pre: 85 %
FEV1-Pre: 3.65 L
FEV1FVC-%Pred-Pre: 100 %
FEV6-%Pred-Pre: 86 %
FEV6-Pre: 4.54 L
FEV6FVC-%Pred-Pre: 102 %
FVC-%Pred-Pre: 84 %
FVC-Pre: 4.54 L
Pre FEV1/FVC ratio: 80 %
Pre FEV6/FVC Ratio: 100 %

## 2023-03-05 NOTE — Patient Instructions (Addendum)
We reviewed your pulmonary function testing today.  Your airflows and diffusion capacity are both normal.  This is good news.  No evidence of any obstructive lung disease associated with sarcoidosis. Your CT scan of the chest from 12/2022 is stable without any evidence for active sarcoid.  Again good news. Continue your CPAP every night as you have been using it.  We established good compliance and good clinical benefit today. Based on your evaluation and clinical status you are low risk for general anesthesia and spine surgery.  You do need to take your CPAP machine with you so you so it could be used in the perioperative wake up period If necessary. Follow with Dr. Delton Coombes in 12 months or sooner if you have any problems.

## 2023-03-05 NOTE — Telephone Encounter (Signed)
OV notes and clearance form have been faxed back to [facility]. Nothing further needed at this time.

## 2023-03-05 NOTE — Assessment & Plan Note (Signed)
Overall stable.  His CT scan of the chest shows some small stable micronodular disease without mediastinal adenopathy.  No change.  His spirometry today is normal.  No evidence of associated asthma.  We will continue to follow annually.  He is at low risk for surgical complications.  Planning for spinal surgery in the near future at [place] [facility].  He will need to take his CPAP with him.

## 2023-03-05 NOTE — Progress Notes (Signed)
Spiro/DLCO performed today. 

## 2023-03-05 NOTE — Progress Notes (Signed)
Subjective:    Patient ID: Lawrence Cantu, male    DOB: [birth year], 37 y.o.   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   [name] 07/17/20 --37 year old gentleman with hypertension and ankylosing spondylitis and cauda equina syndrome whom I met for evaluation of mediastinal and hilar lymphadenopathy (on indiscretion therapy, has been on Cosentyx but is about to be changed to an alternative Remicade). He just started MTX + folate.  He underwent EBUS to evaluate his adenopathy on 06/13/2020, noncaseating granulomas seen station 7.  Fungal culture, AFB culture, bacterial culture all negative and final. His CT chest 05/10/2020 shows stable mediastinal lymphadenopathy, hilar adenopathy stable small left pleural effusion, several right lower lobe nonspecific groundglass nodular opacities.  PFT with mixed disease as above.   [name] 12/19/20 --37 year old man with hypertension and ankylosing spondylitis, cauda equina syndrome.  Is been treated with Remicade, methotrexate and folate.  We diagnosed him with presumed sarcoidosis based on granulomatous disease on endobronchial ultrasound.  All of his cultures were negative.  He has grossly normal pulmonary function testing, possible restriction. Currently on MTX and infliximab through his Rheumatologist. He has more energy and his AS is better. His breathing OK. Has albuterol, doesn't really get much out of it. Denies any rash, wheezing.  His wife questions possible OSA, he has snoring, witnessed apneas. His wt is stable. Has tried nasal strips. He is tired during day and takes naps.    [name] 03/05/2023 --Lawrence Cantu is 36 and has a history of ankylosing spondylitis and cauda equina syndrome on immunosuppressive medication that includes infliximab, methotrexate 20 mg weekly. Also with a history of hypertension  and presumed sarcoidosis based on granulomatous disease on endobronchial ultrasound.  He has obstructive sleep apnea and is on CPAP.  I last saw him in June 2022.  He reports today that has been doing CPAP reliably. No snoring. Probably less sleepiness during the day, less napping.  No new issues, no dyspnea, no cough.  He did have an allergic reaction in June that required him to go to the ED after exposure to shampoo that had strawberries (to which we know he is allergic).  Compliance data 01/12/2023 through 02/10/2023 shows that he has used his CPAP 93% of the nights, 70% for greater than 4 hours.  He is on an AutoSet mode 5-20 cm water.  Pulmonary function testing performed today and reviewed by me shows normal airflows and a normal diffusion capacity.  FEV1 is 3.65 L (85% predicted).   CT chest 12/19/22 reviewed by me shows bilateral subpleural nodules, no hilar or mediastinal adenopathy.    Review of Systems As per HPI     Objective:   Physical Exam  Vitals:   03/05/23 1143  BP: 118/80  Pulse: 94  SpO2: 100%  Weight: 231 lb (104.8 kg)  Height: 5\' 10"  (1.778 m)   Gen: Pleasant, well-nourished, in no distress,  normal affect  ENT: No lesions,  mouth clear,  oropharynx clear, no postnasal drip  Neck: No JVD, no stridor  Lungs: No use of accessory muscles, no crackles or wheezing on normal respiration, no wheeze on forced expiration  Cardiovascular: RRR, heart sounds normal, no murmur or gallops, no peripheral edema  Musculoskeletal: No deformities, no cyanosis or clubbing  Neuro: alert, awake, non focal  Skin: Warm, no lesions or rash      Assessment &  Plan:  Sarcoidosis of lung (HCC) Overall stable.  His CT scan of the chest shows some small stable micronodular disease without mediastinal adenopathy.  No change.  His spirometry today is normal.  No evidence of associated asthma.  We will continue to follow annually.  He is at low risk for surgical complications.  Planning  for spinal surgery in the near future at [facility].  He will need to take his CPAP with him.  OSA (obstructive sleep apnea) Good CPAP compliance and good clinical benefit established today.  Plan to continue same nightly.  He knows to take the CPAP with him when he goes for his surgery   Levy Pupa, MD, PhD 03/05/2023, 12:09 PM [facility] [phone] or if no answer [phone]

## 2023-03-05 NOTE — Assessment & Plan Note (Signed)
Good CPAP compliance and good clinical benefit established today.  Plan to continue same nightly.  He knows to take the CPAP with him when he goes for his surgery

## 2023-03-05 NOTE — Patient Instructions (Signed)
Spiro/DLCO performed today. 

## 2023-03-14 ENCOUNTER — Telehealth: Payer: Self-pay

## 2023-03-14 NOTE — Transitions of Care (Post Inpatient/ED Visit) (Signed)
03/14/2023  Name: Lawrence Cantu MRN: [id number] DOB: 06/02/1986  Today's TOC FU Call Status: Today's TOC FU Call Status:: Successful TOC FU Call Completed TOC FU Call Complete Date: 03/14/23 Patient's Name and Date of Birth confirmed.  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 How have you been since you were released from the hospital?: Better (Patient's pain is managed with PO medications) Any questions or concerns?: No  Items Reviewed: Did you receive and understand the discharge instructions provided?: Yes Medications obtained,verified, and reconciled?: Yes (Medications Reviewed) (Discussed discharge medications) Any new allergies since your discharge?: No Dietary orders reviewed?: No Do you have support at home?: Yes People in Home: spouse Name of Support/Comfort Primary Source: Trish  Medications Reviewed Today: Medications Reviewed Today     Reviewed by Jodelle Gross, RN (Case Manager) on 03/14/23 at 1124  Med List Status: <None>   Medication Order Taking? Sig Documenting Provider Last Dose Status Informant  albuterol (VENTOLIN HFA) 108 (90 Base) MCG/ACT inhaler [id number]  Inhale 1-2 puffs into the lungs every 6 (six) hours as needed. Parrett, Virgel Bouquet, NP  Active   AMBULATORY NON FORMULARY MEDICATION [id number]  Diltiazem gel with 2% lidocaine  Apply a pea sized amount into your rectum three times daily for 4 weeks Armbruster, Willaim Rayas, MD  Active Self  escitalopram (LEXAPRO) 20 MG tablet [id number]  Take 1 tablet (20 mg total) by mouth daily. Georganna Skeans, MD  Active   fexofenadine [facility]) 60 MG tablet [id number]  Take 60 mg by mouth daily as needed for allergies. [provider]  Active Self  folic  acid (FOLVITE) 1 MG tablet [id number]  Take 1 tablet (1 mg total) by mouth daily. Georganna Skeans, MD  Active   gabapentin (NEURONTIN) 600 MG tablet [id number]  Take 2 tablets by mouth 3 (three) times daily. [provider]  Active Self  inFLIXimab-abda (RENFLEXIS) 100 MG SOLR [id number]  Once every 4 weeks infusion [provider]  Active Self  Melatonin 10 MG TABS [id number]  Take 10 mg by mouth at bedtime. [provider]  Active Self  methimazole (TAPAZOLE) 5 MG tablet [id number]  Take 1 tablet (5 mg total) by mouth as directed. 1 tablet monday through Saturday, none on Sundays Shamleffer, Konrad Dolores, MD  Active Self  methocarbamol (ROBAXIN) 500 MG tablet [id number] Yes Take 500 mg by mouth every 6 (six) hours as needed for muscle spasms. [provider] Taking Active   methotrexate (RHEUMATREX) 2.5 MG tablet [id number]  Take 20 mg by mouth once a week. [provider]  Active Self  morphine (MS CONTIN) 15 MG 12 hr tablet [id number] Yes Take 15 mg by mouth 2 (two) times daily.  [provider] Taking Active Self  naloxone [facility]) nasal spray 4 mg/0.1 mL [id number] Yes Place 1 spray into the nose once. [provider] Taking Active   oxyCODONE (OXY IR/ROXICODONE) 5 MG immediate release tablet [id number]  Take 5 mg by mouth in the morning, at noon, and at bedtime.  [provider]  Active Self  riluzole (RILUTEK) 50 MG tablet [id number]  Take 50 mg by mouth every 12 (twelve) hours. [provider]  Active   senna-docusate (SENOKOT-S) 8.6-50 MG tablet [id number] Yes Take 2 tablets by mouth 2 (two) times daily. [provider]  Active  tamsulosin (FLOMAX) 0.4 MG CAPS capsule [id number]  TAKE 1 CAPSULE BY MOUTH AT BEDTIME Georganna Skeans, MD  Active   tiZANidine (ZANAFLEX) 2 MG tablet [id number]  Take 1 tablet (2 mg total) by mouth 3 (three) times daily as needed for muscle spasms. Arvilla Market, MD  Active Self             Home Care and Equipment/Supplies: Were Home Health Services Ordered?: Yes [facility] of [facility] Agency[facility] Has Agency set up a time to come to your home?: Yes First Home Health Visit Date: 03/15/23 Any new equipment or medical supplies ordered?: No  Functional Questionnaire: Do you need assistance with bathing/showering or dressing?: Yes Do you need assistance with meal preparation?: No Do you need assistance with eating?: No Do you have difficulty maintaining continence: No Do you need assistance with getting out of bed/getting out of a chair/moving?: No Do you have difficulty managing or taking your medications?: No  Follow up appointments reviewed: PCP Follow-up appointment confirmed?: NA Specialist Hospital Follow-up appointment confirmed?: Yes Date of Specialist follow-up appointment?: 03/20/23 Follow-Up Specialty Provider:: Dina Rich, NP [facility] Do you need transportation to your follow-up appointment?: No Do you understand care options if your condition(s) worsen?: Yes-patient verbalized understanding  SDOH Interventions Today    Flowsheet Row Most Recent Value  SDOH Interventions   Food Insecurity Interventions Intervention Not Indicated  Housing Interventions Intervention Not Indicated     Jodelle Gross RN, BSN, CCM [facility] RN Care Coordinator/ Transitions of Care Direct Dial: [phone]  Fax: [phone]

## 2023-03-20 ENCOUNTER — Ambulatory Visit: Payer: Medicare Other | Admitting: Internal Medicine

## 2023-04-04 ENCOUNTER — Ambulatory Visit: Payer: Medicare Other | Admitting: Internal Medicine

## 2023-04-08 ENCOUNTER — Encounter: Payer: Self-pay | Admitting: Internal Medicine

## 2023-04-08 ENCOUNTER — Ambulatory Visit (INDEPENDENT_AMBULATORY_CARE_PROVIDER_SITE_OTHER): Payer: Medicare Other | Admitting: Internal Medicine

## 2023-04-08 VITALS — BP 120/70 | HR 78 | Ht 70.0 in | Wt 237.0 lb

## 2023-04-08 DIAGNOSIS — E0590 Thyrotoxicosis, unspecified without thyrotoxic crisis or storm: Secondary | ICD-10-CM | POA: Diagnosis not present

## 2023-04-08 DIAGNOSIS — E0500 Thyrotoxicosis with diffuse goiter without thyrotoxic crisis or storm: Secondary | ICD-10-CM | POA: Diagnosis not present

## 2023-04-08 LAB — T4, FREE: Free T4: 0.8 ng/dL (ref 0.60–1.60)

## 2023-04-08 LAB — TSH: TSH: 1.61 u[IU]/mL (ref 0.35–5.50)

## 2023-04-08 LAB — T3, FREE: T3, Free: 2.9 pg/mL (ref 2.3–4.2)

## 2023-04-08 NOTE — Progress Notes (Unsigned)
Name: Lawrence Cantu  MRN/ DOB: [id number], 1986-01-18    Age/ Sex: 37 y.o., male     PCP: Georganna Skeans, MD   Reason for Endocrinology Evaluation: Hyperthyroidism     Initial Endocrinology Clinic Visit: 07/18/2021    PATIENT IDENTIFIER: [name] is a 37 y.o., male with a past medical history of hyperthyroidism, ankylosing spondylosis and sarcoidosis  . He has followed with [facility] since 07/18/2021 for consultative assistance with management of his Hyperthyroidism.   HISTORICAL SUMMARY: The patient was first diagnosed with hyperthyroidism in 2020.  He was started on methimazole at the time   Thyroid ultrasound 06/08/2019 showed mild diffuse heterogeneity of the thyroid gland without discrete nodules  This has been attributes to Graves disease  Father with thyroid cancer . Two  maternal aunts with hypothyroidism    Patient transferred care from Dr. Everardo All in September 2023  SUBJECTIVE:    Today (04/08/2023):  Mr. [name] is here for on the management of hyperthyroidism.    Patient underwent cervical spine surgery 02/2023 with C1-C7 fusion Continues to follow-up with pulmonary for sarcoidosis of the lung as well as OSA  Weight has been fluctuating  Denies palpitations  Denies tremors  Denies constipation or diarrhea  He is on Renflexis  for ankylosing spondylitis   Last eye exam 09/30/2022    Methimazole 5 mg,  1 tablet Monday through Saturday and none on Sundays     HISTORY:  Past Medical History:  Past Medical History:  Diagnosis Date   Ankylosing spondylitis (HCC)    Anxiety    Arthritis    Phreesia 12/05/2019   BPH (benign prostatic hyperplasia)    Cauda equina syndrome (HCC)    Chronic pain    DDD (degenerative disc disease), cervical    DDD (degenerative disc disease), lumbar    Depression    Phreesia 12/05/2019   Enlarged prostate    Hyperlipidemia    Hypertension    pt denies   Neuromuscular disorder (HCC)    Phreesia  12/05/2019   Pneumonia    x 2 as a child, 1 x as an adult   Post laminectomy syndrome    Sarcoidosis    Spinal stenosis    Past Surgical History:  Past Surgical History:  Procedure Laterality Date   ANAL RECTAL MANOMETRY N/A 01/17/2021   Procedure: ANO RECTAL MANOMETRY;  Surgeon: Benancio Deeds, MD;  Location: [facility];  Service: Gastroenterology;  Laterality: N/A;   BRONCHIAL WASHINGS  06/13/2020   Procedure: BRONCHIAL WASHINGS;  Surgeon: Leslye Peer, MD;  Location: [facility];  Service: Pulmonary;;   CERVICAL FUSION     FINE NEEDLE ASPIRATION  06/13/2020   Procedure: FINE NEEDLE ASPIRATION (FNA) LINEAR;  Surgeon: Leslye Peer, MD;  Location: [facility];  Service: Pulmonary;;   IR FL GUIDED LOC OF NEEDLE/CATH TIP FOR SPINAL INJECTION LT     has had at least 10 of these   LAMINECTOMY AND MICRODISCECTOMY LUMBAR SPINE     SPINAL CORD STIMULATOR IMPLANT  ?2018   SPINAL FUSION     SPINE SURGERY N/A    Phreesia 12/05/2019   VIDEO BRONCHOSCOPY WITH ENDOBRONCHIAL ULTRASOUND Bilateral 06/13/2020   Procedure: VIDEO BRONCHOSCOPY WITH ENDOBRONCHIAL ULTRASOUND;  Surgeon: Leslye Peer, MD;  Location: [facility];  Service: Pulmonary;  Laterality: Bilateral;   Social History:  reports that he quit smoking about 8 years ago. His smoking use included cigarettes. He started smoking about 13 years ago. He has a  2.5 pack-year smoking history. He has never used smokeless tobacco. He reports current alcohol use of about 2.0 standard drinks of alcohol per week. He reports that he does not use drugs. Family History:  Family History  Problem Relation Age of Onset   Depression Mother    Breast cancer Mother    Depression Father    Thyroid cancer Father    Depression Sister    Depression Brother    Hypertension Maternal Grandmother    Hyperlipidemia Maternal Grandmother    Heart disease Maternal Grandmother    Atrial fibrillation Maternal Grandmother    Diabetes Maternal  Grandmother    Heart attack Maternal Grandfather      HOME MEDICATIONS: Allergies as of 04/08/2023       Reactions   Strawberry (diagnostic) Anaphylaxis   Other Hives   Cat Dander   Prednisone Other (See Comments)   Skin crawling and leg pain, even in small doses        Medication List        Accurate as of April 08, 2023 11:34 AM. If you have any questions, ask your nurse or doctor.          albuterol 108 (90 Base) MCG/ACT inhaler Commonly known as: VENTOLIN HFA Inhale 1-2 puffs into the lungs every 6 (six) hours as needed.   AMBULATORY NON FORMULARY MEDICATION Diltiazem gel with 2% lidocaine  Apply a pea sized amount into your rectum three times daily for 4 weeks   escitalopram 20 MG tablet Commonly known as: LEXAPRO Take 1 tablet (20 mg total) by mouth daily.   fexofenadine 60 MG tablet Commonly known as: ALLEGRA Take 60 mg by mouth daily as needed for allergies.   folic acid 1 MG tablet Commonly known as: FOLVITE Take 1 tablet (1 mg total) by mouth daily.   gabapentin 600 MG tablet Commonly known as: NEURONTIN Take 2 tablets by mouth 3 (three) times daily.   Melatonin 10 MG Tabs Take 10 mg by mouth at bedtime.   methimazole 5 MG tablet Commonly known as: TAPAZOLE Take 1 tablet (5 mg total) by mouth as directed. 1 tablet monday through Saturday, none on Sundays   methocarbamol 500 MG tablet Commonly known as: ROBAXIN Take 500 mg by mouth every 6 (six) hours as needed for muscle spasms.   methotrexate 2.5 MG tablet Commonly known as: RHEUMATREX Take 20 mg by mouth once a week.   morphine 15 MG 12 hr tablet Commonly known as: MS CONTIN Take 15 mg by mouth 2 (two) times daily.   naloxone 4 MG/0.1ML Liqd nasal spray kit Commonly known as: NARCAN Place 1 spray into the nose once.   oxyCODONE 5 MG immediate release tablet Commonly known as: Oxy IR/ROXICODONE Take 5 mg by mouth in the morning, at noon, and at bedtime.   Renflexis 100 MG  Solr Generic drug: inFLIXimab-abda Once every 4 weeks infusion   riluzole 50 MG tablet Commonly known as: RILUTEK Take 50 mg by mouth every 12 (twelve) hours.   senna-docusate 8.6-50 MG tablet Commonly known as: Senokot-S Take 2 tablets by mouth 2 (two) times daily.   tamsulosin 0.4 MG Caps capsule Commonly known as: FLOMAX TAKE 1 CAPSULE BY MOUTH AT BEDTIME   tiZANidine 2 MG tablet Commonly known as: ZANAFLEX Take 1 tablet (2 mg total) by mouth 3 (three) times daily as needed for muscle spasms.          OBJECTIVE:   PHYSICAL EXAM: VS: There were no vitals  taken for this visit.   EXAM: General: Pt appears well and is in NAD  Eyes: External eye exam normal without stare, lid lag or exophthalmos.  EOM intact.    Neck: General: Supple without adenopathy. Thyroid: Thyroid size normal.  No goiter or nodules appreciated.   Lungs: Clear with good BS bilat with no rales, rhonchi, or wheezes  Heart: Auscultation: RRR.  Extremities:  BL LE: No pretibial edema normal ROM and strength.  Mental Status: Judgment, insight: Intact Orientation: Oriented to time, place, and person Mood and affect: No depression, anxiety, or agitation     DATA REVIEWED:     Latest Reference Range & Units 09/30/22 13:19  TSH 0.35 - 5.50 uIU/mL 3.12  T4,Free(Direct) 0.60 - 1.60 ng/dL 8.65    Latest Reference Range & Units 12/18/22 19:50  Sodium 135 - 145 mmol/L 140  Potassium 3.5 - 5.1 mmol/L 3.7  Chloride 98 - 111 mmol/L 103  CO2 22 - 32 mmol/L 27  Glucose 70 - 99 mg/dL 96  BUN 6 - 20 mg/dL 13  Creatinine 7.84 - 6.96 mg/dL 2.95  Calcium 8.9 - 28.4 mg/dL 9.4  Anion gap 5 - 15  10  GFR, Estimated >60 mL/min >60  Troponin I (High Sensitivity) <18 ng/L 4    ASSESSMENT / PLAN / RECOMMENDATIONS:   Hyperthyroidism:  -Clinical scenario consistent with Graves' disease -Ultrasound of the thyroid in the past did not reveal any nodules -We did gust that Graves' disease and autoimmune  condition -TFTs today are normal, no change   Medications  Decrease  methimazole 5 mg, 1 tablet Monday through Saturday and none on Sundays     2. Graves' Disease:  -No extrathyroidal manifestation of Graves' disease  F/U in 6 months     Signed electronically by: Lyndle Herrlich, MD  [facility]  [facility] [address] [place], [place] [address] Phone: [phone] FAX: [phone]      CC: Georganna Skeans, MD [address] [place] [address] Phone: [phone]  Fax: [phone]   Return to Endocrinology clinic as below: Future Appointments  Date Time Provider Department Center  07/30/2023  1:20 PM Georganna Skeans, MD [facility] None

## 2023-04-24 ENCOUNTER — Other Ambulatory Visit: Payer: Self-pay | Admitting: Internal Medicine

## 2023-07-19 ENCOUNTER — Other Ambulatory Visit: Payer: Self-pay | Admitting: Family Medicine

## 2023-07-19 DIAGNOSIS — F32A Depression, unspecified: Secondary | ICD-10-CM

## 2023-07-20 ENCOUNTER — Other Ambulatory Visit: Payer: Self-pay | Admitting: Family Medicine

## 2023-07-22 ENCOUNTER — Other Ambulatory Visit: Payer: Self-pay | Admitting: Family Medicine

## 2023-07-22 DIAGNOSIS — S343XXS Injury of cauda equina, sequela: Secondary | ICD-10-CM

## 2023-07-30 ENCOUNTER — Ambulatory Visit: Payer: Medicare Other | Admitting: Family Medicine

## 2023-08-19 ENCOUNTER — Ambulatory Visit (INDEPENDENT_AMBULATORY_CARE_PROVIDER_SITE_OTHER): Payer: Medicare Other | Admitting: Family Medicine

## 2023-08-19 DIAGNOSIS — F32A Depression, unspecified: Secondary | ICD-10-CM | POA: Diagnosis not present

## 2023-08-19 MED ORDER — ESCITALOPRAM OXALATE 20 MG PO TABS
20.0000 mg | ORAL_TABLET | Freq: Every day | ORAL | 1 refills | Status: DC
Start: 2023-08-19 — End: 2024-02-16

## 2023-08-21 ENCOUNTER — Encounter: Payer: Self-pay | Admitting: Family Medicine

## 2023-08-21 NOTE — Progress Notes (Signed)
Established Patient Office Visit  Subjective    Patient ID: Lawrence Cantu, male    DOB: 1986-06-11  Age: 38 y.o. MRN: [id number]  CC:  Chief Complaint  Patient presents with   Follow-up    6 month    HPI [name] presents for routine follow up of depression. Patient reports that he feels that he is doing well with his meds at the particular dosage. Patient denies acte complaints.   Outpatient Encounter Medications as of 08/19/2023  Medication Sig   albuterol (VENTOLIN HFA) 108 (90 Base) MCG/ACT inhaler Inhale 1-2 puffs into the lungs every 6 (six) hours as needed.   AMBULATORY NON FORMULARY MEDICATION Diltiazem gel with 2% lidocaine  Apply a pea sized amount into your rectum three times daily for 4 weeks   diazepam (VALIUM) 5 MG tablet SMARTSIG:1 Tablet(s) By Mouth Every 12 Hours PRN   fexofenadine (ALLEGRA) 60 MG tablet Take 60 mg by mouth daily as needed for allergies.   folic acid (FOLVITE) 1 MG tablet TAKE 1 TABLET BY MOUTH EVERY DAY   gabapentin (NEURONTIN) 600 MG tablet Take 2 tablets by mouth 3 (three) times daily.   inFLIXimab-abda (RENFLEXIS) 100 MG SOLR Once every 4 weeks infusion   loratadine (CLARITIN) 10 MG tablet Take by mouth.   Melatonin 10 MG TABS Take 10 mg by mouth at bedtime.   methimazole (TAPAZOLE) 5 MG tablet 1 tablet monday through Saturday, none on Sundays   methocarbamol (ROBAXIN) 500 MG tablet Take 500 mg by mouth every 6 (six) hours as needed for muscle spasms.   methotrexate (RHEUMATREX) 2.5 MG tablet Take 20 mg by mouth once a week.   morphine (MS CONTIN) 15 MG 12 hr tablet Take 15 mg by mouth 2 (two) times daily.    naloxone (NARCAN) nasal spray 4 mg/0.1 mL Place 1 spray into the nose once.   oxyCODONE (OXY IR/ROXICODONE) 5 MG immediate release tablet Take 5 mg by mouth in the morning, at noon, and at bedtime.    oxyCODONE (OXY IR/ROXICODONE) 5 MG immediate release tablet Take by mouth.   riluzole (RILUTEK) 50 MG tablet Take 50 mg by mouth every 12  (twelve) hours.   senna-docusate (SENOKOT-S) 8.6-50 MG tablet Take 2 tablets by mouth 2 (two) times daily.   tamsulosin (FLOMAX) 0.4 MG CAPS capsule TAKE 1 CAPSULE BY MOUTH EVERYDAY AT BEDTIME   tiZANidine (ZANAFLEX) 2 MG tablet Take 1 tablet (2 mg total) by mouth 3 (three) times daily as needed for muscle spasms.   [DISCONTINUED] escitalopram (LEXAPRO) 20 MG tablet TAKE 1 TABLET BY MOUTH EVERY DAY   escitalopram (LEXAPRO) 20 MG tablet Take 1 tablet (20 mg total) by mouth daily.   No facility-administered encounter medications on file as of 08/19/2023.    Past Medical History:  Diagnosis Date   Ankylosing spondylitis (HCC)    Anxiety    Arthritis    Phreesia 12/05/2019   BPH (benign prostatic hyperplasia)    Cauda equina syndrome (HCC)    Chronic pain    DDD (degenerative disc disease), cervical    DDD (degenerative disc disease), lumbar    Depression    Phreesia 12/05/2019   Enlarged prostate    Hyperlipidemia    Hypertension    pt denies   Neuromuscular disorder (HCC)    Phreesia 12/05/2019   Pneumonia    x 2 as a child, 1 x as an adult   Post laminectomy syndrome    Sarcoidosis    Spinal stenosis  Past Surgical History:  Procedure Laterality Date   ANAL RECTAL MANOMETRY N/A 01/17/2021   Procedure: ANO RECTAL MANOMETRY;  Surgeon: Benancio Deeds, MD;  Location: [facility];  Service: Gastroenterology;  Laterality: N/A;   BRONCHIAL WASHINGS  06/13/2020   Procedure: BRONCHIAL WASHINGS;  Surgeon: Leslye Peer, MD;  Location: [facility];  Service: Pulmonary;;   CERVICAL FUSION     FINE NEEDLE ASPIRATION  06/13/2020   Procedure: FINE NEEDLE ASPIRATION (FNA) LINEAR;  Surgeon: Leslye Peer, MD;  Location: [facility];  Service: Pulmonary;;   IR FL GUIDED LOC OF NEEDLE/CATH TIP FOR SPINAL INJECTION LT     has had at least 10 of these   LAMINECTOMY AND MICRODISCECTOMY LUMBAR SPINE     SPINAL CORD STIMULATOR IMPLANT  ?2018   SPINAL FUSION     SPINE SURGERY N/A     Phreesia 12/05/2019   VIDEO BRONCHOSCOPY WITH ENDOBRONCHIAL ULTRASOUND Bilateral 06/13/2020   Procedure: VIDEO BRONCHOSCOPY WITH ENDOBRONCHIAL ULTRASOUND;  Surgeon: Leslye Peer, MD;  Location: [facility];  Service: Pulmonary;  Laterality: Bilateral;    Family History  Problem Relation Age of Onset   Depression Mother    Breast cancer Mother    Depression Father    Thyroid cancer Father    Depression Sister    Depression Brother    Hypertension Maternal Grandmother    Hyperlipidemia Maternal Grandmother    Heart disease Maternal Grandmother    Atrial fibrillation Maternal Grandmother    Diabetes Maternal Grandmother    Heart attack Maternal Grandfather     Social History   Socioeconomic History   Marital status: Married    Spouse name: Scientist, water quality   Number of children: 0   Years of education: Not on file   Highest education level: Some college, no degree  Occupational History   Occupation: disabled  Tobacco Use   Smoking status: Former    Current packs/day: 0.00    Average packs/day: 0.5 packs/day for 5.0 years (2.5 ttl pk-yrs)    Types: Cigarettes    Start date: 04/20/2009    Quit date: 04/20/2014    Years since quitting: 9.3   Smokeless tobacco: Never  Vaping Use   Vaping status: Never Used  Substance and Sexual Activity   Alcohol use: Yes    Alcohol/week: 2.0 standard drinks of alcohol    Types: 2 Standard drinks or equivalent per week    Comment: 1 beer a week   Drug use: Never   Sexual activity: Not on file  Other Topics Concern   Not on file  Social History Narrative   Right Handed    Lives in a two story home    Social Drivers of Health   Financial Resource Strain: Medium Risk (08/18/2023)   Overall Financial Resource Strain (CARDIA)    Difficulty of Paying Living Expenses: Somewhat hard  Food Insecurity: Food Insecurity Present (08/18/2023)   Hunger Vital Sign    Worried About Running Out of Food in the Last Year: Never true    Ran Out of  Food in the Last Year: Sometimes true  Transportation Needs: Unmet Transportation Needs (08/18/2023)   PRAPARE - Transportation    Lack of Transportation (Medical): Yes    Lack of Transportation (Non-Medical): Patient declined  Physical Activity: Inactive (08/18/2023)   Exercise Vital Sign    Days of Exercise per Week: 0 days    Minutes of Exercise per Session: 0 min  Stress: Stress Concern Present (08/18/2023)   [place]  [employer] - Occupational Stress Questionnaire    Feeling of Stress : To some extent  Social Connections: Unknown (08/18/2023)   Social Connection and Isolation Panel [NHANES]    Frequency of Communication with Friends and Family: Once a week    Frequency of Social Gatherings with Friends and Family: Patient declined    Attends Religious Services: Never    Database administrator or Organizations: No    Attends Engineer, structural: Not on file    Marital Status: Married  Catering manager Violence: Not At Risk (09/07/2021)   Humiliation, Afraid, Rape, and Kick questionnaire    Fear of Current or Ex-Partner: No    Emotionally Abused: No    Physically Abused: No    Sexually Abused: No    Review of Systems  All other systems reviewed and are negative.       Objective    BP 120/74 (BP Location: Right Arm, Patient Position: Sitting, Cuff Size: Normal)   Pulse 84   Temp 98.4 F (36.9 C) (Oral)   Resp 18   Ht 5\' 10"  (1.778 m)   Wt 208 lb (94.3 kg)   SpO2 95%   BMI 29.84 kg/m   Physical Exam Vitals and nursing note reviewed.  Constitutional:      General: He is not in acute distress. HENT:     Mouth/Throat:     Mouth: Mucous membranes are moist.     Pharynx: Oropharynx is clear.  Cardiovascular:     Rate and Rhythm: Normal rate and regular rhythm.  Pulmonary:     Effort: Pulmonary effort is normal.     Breath sounds: Normal breath sounds.  Neurological:     General: No focal deficit present.     Mental Status: He is alert  and oriented to person, place, and time.  Psychiatric:        Mood and Affect: Mood normal.        Behavior: Behavior normal.         Assessment & Plan:   Depression, unspecified depression type -     Escitalopram Oxalate; Take 1 tablet (20 mg total) by mouth daily.  Dispense: 90 tablet; Refill: 1     Return in about 6 months (around 02/16/2024) for follow up.   Tommie Raymond, MD

## 2023-10-08 ENCOUNTER — Encounter: Payer: Self-pay | Admitting: Internal Medicine

## 2023-10-08 ENCOUNTER — Ambulatory Visit (INDEPENDENT_AMBULATORY_CARE_PROVIDER_SITE_OTHER): Payer: Medicare Other | Admitting: Internal Medicine

## 2023-10-08 VITALS — BP 98/66 | HR 77 | Resp 16 | Ht 70.0 in | Wt 202.2 lb

## 2023-10-08 DIAGNOSIS — E0590 Thyrotoxicosis, unspecified without thyrotoxic crisis or storm: Secondary | ICD-10-CM

## 2023-10-08 DIAGNOSIS — E0500 Thyrotoxicosis with diffuse goiter without thyrotoxic crisis or storm: Secondary | ICD-10-CM | POA: Diagnosis not present

## 2023-10-08 NOTE — Progress Notes (Unsigned)
 Name: Lawrence Cantu  MRN/ DOB: [id number], 1986/02/17    Age/ Sex: 38 y.o., male     PCP: Georganna Skeans, MD   Reason for Endocrinology Evaluation: Hyperthyroidism     Initial Endocrinology Clinic Visit: 07/18/2021    PATIENT IDENTIFIER: [name] is a 38 y.o., male with a past medical history of hyperthyroidism, ankylosing spondylosis and sarcoidosis  . He has followed with [facility] since 07/18/2021 for consultative assistance with management of his Hyperthyroidism.   HISTORICAL SUMMARY: The patient was first diagnosed with hyperthyroidism in 2020.  He was started on methimazole at the time   Thyroid ultrasound 06/08/2019 showed mild diffuse heterogeneity of the thyroid gland without discrete nodules  This has been attributes to Graves disease  Father with thyroid cancer . Two  maternal aunts with hypothyroidism    Patient transferred care from Dr. Everardo All in September 2023  SUBJECTIVE:    Today (10/08/2023):  Lawrence Cantu is here for on the management of hyperthyroidism.   Patient continues to follow-up with [facility] neurosurgery due to history of C1-C7 fusion in August 2024 Continues to follow-up with pulmonary for sarcoidosis of the lung as well as OSA  Patient has been noted with weight loss, this is intentional with a restrictively low carb diet  No local neck swelling per se, but he did have a transient left neck swelling at some point Denies palpitations per se, but he did have what he describes as upper left chest muscle twitching Denies tremors  Denies constipation or diarrhea  He is on Renflexis  for ankylosing spondylitis      Methimazole 5 mg, 1 tablet Monday through Friday , none on Saturdays or Sundays    HISTORY:  Past Medical History:  Past Medical History:  Diagnosis Date   Ankylosing spondylitis (HCC)    Anxiety    Arthritis    Phreesia 12/05/2019   BPH (benign prostatic hyperplasia)    Cauda equina syndrome (HCC)    Chronic  pain    DDD (degenerative disc disease), cervical    DDD (degenerative disc disease), lumbar    Depression    Phreesia 12/05/2019   Enlarged prostate    Hyperlipidemia    Hypertension    pt denies   Neuromuscular disorder (HCC)    Phreesia 12/05/2019   Pneumonia    x 2 as a child, 1 x as an adult   Post laminectomy syndrome    Sarcoidosis    Spinal stenosis    Past Surgical History:  Past Surgical History:  Procedure Laterality Date   ANAL RECTAL MANOMETRY N/A 01/17/2021   Procedure: ANO RECTAL MANOMETRY;  Surgeon: Benancio Deeds, MD;  Location: [facility];  Service: Gastroenterology;  Laterality: N/A;   BRONCHIAL WASHINGS  06/13/2020   Procedure: BRONCHIAL WASHINGS;  Surgeon: Leslye Peer, MD;  Location: [facility];  Service: Pulmonary;;   CERVICAL FUSION     FINE NEEDLE ASPIRATION  06/13/2020   Procedure: FINE NEEDLE ASPIRATION (FNA) LINEAR;  Surgeon: Leslye Peer, MD;  Location: [facility];  Service: Pulmonary;;   IR FL GUIDED LOC OF NEEDLE/CATH TIP FOR SPINAL INJECTION LT     has had at least 10 of these   LAMINECTOMY AND MICRODISCECTOMY LUMBAR SPINE     SPINAL CORD STIMULATOR IMPLANT  ?2018   SPINAL FUSION     SPINE SURGERY N/A    Phreesia 12/05/2019   VIDEO BRONCHOSCOPY WITH ENDOBRONCHIAL ULTRASOUND Bilateral 06/13/2020   Procedure: VIDEO BRONCHOSCOPY WITH ENDOBRONCHIAL  ULTRASOUND;  Surgeon: Leslye Peer, MD;  Location: [facility] ENDOSCOPY;  Service: Pulmonary;  Laterality: Bilateral;   Social History:  reports that he quit smoking about 9 years ago. His smoking use included cigarettes. He started smoking about 14 years ago. He has a 2.5 pack-year smoking history. He has never used smokeless tobacco. He reports current alcohol use of about 2.0 standard drinks of alcohol per week. He reports that he does not use drugs. Family History:  Family History  Problem Relation Age of Onset   Depression Mother    Breast cancer Mother    Depression Father    Thyroid  cancer Father    Depression Sister    Depression Brother    Hypertension Maternal Grandmother    Hyperlipidemia Maternal Grandmother    Heart disease Maternal Grandmother    Atrial fibrillation Maternal Grandmother    Diabetes Maternal Grandmother    Heart attack Maternal Grandfather      HOME MEDICATIONS: Allergies as of 10/08/2023       Reactions   Strawberry (diagnostic) Anaphylaxis   Other Hives   Cat Dander   Prednisone Other (See Comments)   Skin crawling and leg pain, even in small doses        Medication List        Accurate as of October 08, 2023  1:07 PM. If you have any questions, ask your nurse or doctor.          albuterol 108 (90 Base) MCG/ACT inhaler Commonly known as: VENTOLIN HFA Inhale 1-2 puffs into the lungs every 6 (six) hours as needed.   AMBULATORY NON FORMULARY MEDICATION Diltiazem gel with 2% lidocaine  Apply a pea sized amount into your rectum three times daily for 4 weeks   diazepam 5 MG tablet Commonly known as: VALIUM SMARTSIG:1 Tablet(s) By Mouth Every 12 Hours PRN   escitalopram 20 MG tablet Commonly known as: LEXAPRO Take 1 tablet (20 mg total) by mouth daily.   fexofenadine 60 MG tablet Commonly known as: ALLEGRA Take 60 mg by mouth daily as needed for allergies.   folic acid 1 MG tablet Commonly known as: FOLVITE TAKE 1 TABLET BY MOUTH EVERY DAY   gabapentin 600 MG tablet Commonly known as: NEURONTIN Take 2 tablets by mouth 3 (three) times daily.   loratadine 10 MG tablet Commonly known as: CLARITIN Take by mouth.   Melatonin 10 MG Tabs Take 10 mg by mouth at bedtime.   methimazole 5 MG tablet Commonly known as: TAPAZOLE 1 tablet monday through Saturday, none on Sundays   methocarbamol 500 MG tablet Commonly known as: ROBAXIN Take 500 mg by mouth every 6 (six) hours as needed for muscle spasms.   methotrexate 2.5 MG tablet Commonly known as: RHEUMATREX Take 20 mg by mouth once a week.   morphine 15 MG 12 hr  tablet Commonly known as: MS CONTIN Take 15 mg by mouth 2 (two) times daily.   naloxone 4 MG/0.1ML Liqd nasal spray kit Commonly known as: NARCAN Place 1 spray into the nose once.   oxyCODONE 5 MG immediate release tablet Commonly known as: Oxy IR/ROXICODONE Take 5 mg by mouth in the morning, at noon, and at bedtime.   oxyCODONE 5 MG immediate release tablet Commonly known as: Oxy IR/ROXICODONE Take by mouth.   Renflexis 100 MG Solr Generic drug: inFLIXimab-abda Once every 4 weeks infusion   riluzole 50 MG tablet Commonly known as: RILUTEK Take 50 mg by mouth every 12 (twelve) hours.  senna-docusate 8.6-50 MG tablet Commonly known as: Senokot-S Take 2 tablets by mouth 2 (two) times daily.   tamsulosin 0.4 MG Caps capsule Commonly known as: FLOMAX TAKE 1 CAPSULE BY MOUTH EVERYDAY AT BEDTIME   tiZANidine 2 MG tablet Commonly known as: ZANAFLEX Take 1 tablet (2 mg total) by mouth 3 (three) times daily as needed for muscle spasms.          OBJECTIVE:   PHYSICAL EXAM: VS: BP 98/66   Pulse 77   Resp 16   Ht 5\' 10"  (1.778 m)   Wt 202 lb 3.2 oz (91.7 kg)   SpO2 97%   BMI 29.01 kg/m    EXAM: General: Pt appears well and is in NAD  Neck: General: Supple without adenopathy. Thyroid: Thyroid size normal.  No goiter or nodules appreciated.   Lungs: Clear with good BS bilat   Heart: Auscultation: RRR.  Extremities:  BL LE: No pretibial edema   Mental Status: Judgment, insight: Intact Orientation: Oriented to time, place, and person Mood and affect: No depression, anxiety, or agitation     DATA REVIEWED: ****  Latest Reference Range & Units 12/18/22 19:50  Sodium 135 - 145 mmol/L 140  Potassium 3.5 - 5.1 mmol/L 3.7  Chloride 98 - 111 mmol/L 103  CO2 22 - 32 mmol/L 27  Glucose 70 - 99 mg/dL 96  BUN 6 - 20 mg/dL 13  Creatinine 1.61 - 0.96 mg/dL 0.45  Calcium 8.9 - 40.9 mg/dL 9.4  Anion gap 5 - 15  10  GFR, Estimated >60 mL/min >60  Troponin I (High  Sensitivity) <18 ng/L 4    ASSESSMENT / PLAN / RECOMMENDATIONS:   Hyperthyroidism:  -Clinical scenario consistent with Graves' disease -Ultrasound of the thyroid in the past did not reveal any nodules -TFTs ****   Medications    methimazole 5 mg, 1 tablet Monday through Friday , none on Saturdays and none on Sundays     2. Graves' Disease:  -No extrathyroidal manifestation of Graves' disease      F/U in 6 months     Signed electronically by: Lyndle Herrlich, MD  [facility] Endocr[facility]ology  [facility] [address] [place], [place] [address] Phone: [phone] FAX: [phone]      CC: Georganna Skeans, MD [address] [place] [address] Phone: [phone]  Fax: [phone]   Return to Endocrinology clinic as below: Future Appointments  Date Time Provider Department Center  12/25/2023  3:40 PM PCE-ANNUAL WELLNESS VISIT [facility] None  02/16/2024  2:20 PM Georganna Skeans, MD [facility] None

## 2023-10-09 ENCOUNTER — Encounter: Payer: Self-pay | Admitting: Internal Medicine

## 2023-10-09 LAB — TSH: TSH: 1.62 m[IU]/L (ref 0.40–4.50)

## 2023-10-09 LAB — T4, FREE: Free T4: 1.1 ng/dL (ref 0.8–1.8)

## 2023-10-09 MED ORDER — METHIMAZOLE 5 MG PO TABS: ORAL_TABLET | ORAL | 3 refills | Status: DC

## 2023-11-27 ENCOUNTER — Other Ambulatory Visit: Payer: Self-pay | Admitting: Neurosurgery

## 2023-11-27 DIAGNOSIS — M456 Ankylosing spondylitis lumbar region: Secondary | ICD-10-CM

## 2023-12-08 ENCOUNTER — Ambulatory Visit
Admission: RE | Admit: 2023-12-08 | Discharge: 2023-12-08 | Disposition: A | Discharge status: Home / Self Care | Source: Ambulatory Visit | Attending: Neurosurgery | Admitting: Neurosurgery

## 2023-12-08 DIAGNOSIS — M456 Ankylosing spondylitis lumbar region: Secondary | ICD-10-CM

## 2023-12-25 ENCOUNTER — Ambulatory Visit: Payer: Medicare Other

## 2024-01-14 ENCOUNTER — Other Ambulatory Visit: Payer: Self-pay | Admitting: Family Medicine

## 2024-01-14 DIAGNOSIS — S343XXS Injury of cauda equina, sequela: Secondary | ICD-10-CM

## 2024-01-28 ENCOUNTER — Other Ambulatory Visit (HOSPITAL_COMMUNITY): Payer: Self-pay

## 2024-01-28 DIAGNOSIS — M549 Dorsalgia, unspecified: Secondary | ICD-10-CM

## 2024-01-28 DIAGNOSIS — R208 Other disturbances of skin sensation: Secondary | ICD-10-CM

## 2024-01-31 ENCOUNTER — Encounter (HOSPITAL_COMMUNITY): Payer: Self-pay

## 2024-02-03 ENCOUNTER — Ambulatory Visit (HOSPITAL_COMMUNITY)
Admission: RE | Admit: 2024-02-03 | Discharge: 2024-02-03 | Disposition: A | Discharge status: Home / Self Care | Source: Ambulatory Visit

## 2024-02-03 DIAGNOSIS — M549 Dorsalgia, unspecified: Secondary | ICD-10-CM | POA: Insufficient documentation

## 2024-02-03 DIAGNOSIS — R208 Other disturbances of skin sensation: Secondary | ICD-10-CM | POA: Diagnosis present

## 2024-02-16 ENCOUNTER — Ambulatory Visit (INDEPENDENT_AMBULATORY_CARE_PROVIDER_SITE_OTHER): Payer: Medicare Other | Admitting: Family Medicine

## 2024-02-16 ENCOUNTER — Encounter: Payer: Self-pay | Admitting: Family Medicine

## 2024-02-16 VITALS — BP 115/74 | HR 75 | Ht 70.0 in | Wt 203.0 lb

## 2024-02-16 DIAGNOSIS — Z91018 Allergy to other foods: Secondary | ICD-10-CM

## 2024-02-16 DIAGNOSIS — F32A Depression, unspecified: Secondary | ICD-10-CM | POA: Diagnosis not present

## 2024-02-16 MED ORDER — ESCITALOPRAM OXALATE 20 MG PO TABS: 20.0000 mg | ORAL_TABLET | Freq: Every day | ORAL | 1 refills | Status: AC

## 2024-02-16 MED ORDER — EPINEPHRINE 0.3 MG/0.3ML IJ SOAJ: 0.3000 mg | INTRAMUSCULAR | 1 refills | Status: AC | PRN

## 2024-02-19 NOTE — Progress Notes (Signed)
 Established Patient Office Visit  Subjective    Patient ID: Lawrence Cantu, male    DOB: March 19, 1986  Age: 38 y.o. 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     HPI Lawrence Cantu presents for complaint of persistent and worsening food allergies, particularly strawberries. Patient also for follow up of depression. Reports doing well with present management.   Outpatient Encounter Medications as of 02/16/2024  Medication Sig   albuterol (VENTOLIN HFA) 108 (90 Base) MCG/ACT inhaler Inhale 1-2 puffs into the lungs every 6 (six) hours as needed.   EPINEPHrine 0.3 mg/0.3 mL IJ SOAJ injection Inject 0.3 mg into the muscle as needed for anaphylaxis.   fexofenadine (ALLEGRA) 60 MG tablet Take 60 mg by mouth daily as needed for allergies.   folic acid (FOLVITE) 1 MG tablet TAKE 1 TABLET BY MOUTH EVERY DAY   gabapentin (NEURONTIN) 600 MG tablet Take 2 tablets by mouth 3 (three) times daily.   inFLIXimab-abda (RENFLEXIS) 100 MG SOLR Once every 4 weeks infusion   Melatonin 10 MG TABS Take 10 mg by mouth at bedtime.   methimazole (TAPAZOLE) 5 MG tablet 1 tablet monday through Thursday only none the rest of the week   methotrexate (RHEUMATREX) 2.5 MG tablet Take 20 mg by mouth once a week.   morphine (MS CONTIN) 15 MG 12 hr tablet Take 15 mg by mouth 2 (two) times daily.    oxyCODONE (OXY IR/ROXICODONE) 5 MG immediate release tablet Take 5 mg by mouth in the morning, at noon, and at bedtime.    tamsulosin (FLOMAX) 0.4 MG CAPS capsule TAKE 1 CAPSULE BY MOUTH EVERYDAY AT BEDTIME   tiZANidine (ZANAFLEX) 2 MG tablet Take 1 tablet (2 mg total) by mouth 3 (three) times daily as needed for muscle spasms.   [DISCONTINUED] escitalopram (LEXAPRO) 20 MG tablet Take 1 tablet (20 mg total) by mouth daily.   AMBULATORY NON FORMULARY  MEDICATION Diltiazem gel with 2% lidocaine  Apply a pea sized amount into your rectum three times daily for 4 weeks   escitalopram (LEXAPRO) 20 MG tablet Take 1 tablet (20 mg total) by mouth daily.   loratadine (CLARITIN) 10 MG tablet Take by mouth. (Patient not taking: Reported on 10/08/2023)   [DISCONTINUED] diazepam (VALIUM) 5 MG tablet SMARTSIG:1 Tablet(s) By Mouth Every 12 Hours PRN   [DISCONTINUED] methocarbamol (ROBAXIN) 500 MG tablet Take 500 mg by mouth every 6 (six) hours as needed for muscle spasms. (Patient not taking: Reported on 10/08/2023)   [DISCONTINUED] naloxone (NARCAN) nasal spray 4 mg/0.1 mL Place 1 spray into the nose once.   [DISCONTINUED] oxyCODONE (OXY IR/ROXICODONE) 5 MG immediate release tablet Take by mouth.   [DISCONTINUED] riluzole (RILUTEK) 50 MG tablet Take 50 mg by mouth every 12 (twelve) hours. (Patient not taking: Reported on 10/08/2023)   [DISCONTINUED] senna-docusate (SENOKOT-S) 8.6-50 MG tablet Take 2 tablets by mouth 2 (two) times daily. (Patient not taking: Reported on 10/08/2023)   No facility-administered encounter medications on file as of 02/16/2024.    Past Medical History:  Diagnosis Date   Ankylosing spondylitis (HCC)    Anxiety    Arthritis    Phreesia 12/05/2019   BPH (benign prostatic hyperplasia)    Cauda equina syndrome (HCC)    Chronic pain    DDD (degenerative  disc disease), cervical    DDD (degenerative disc disease), lumbar    Depression    Phreesia 12/05/2019   Enlarged prostate    Hyperlipidemia    Hypertension    pt denies   Neuromuscular disorder (HCC)    Phreesia 12/05/2019   Pneumonia    x 2 as a child, 1 x as an adult   Post laminectomy syndrome    Sarcoidosis    Spinal stenosis     Past Surgical History:  Procedure Laterality Date   ANAL RECTAL MANOMETRY N/A 01/17/2021   Procedure: ANO RECTAL MANOMETRY;  Surgeon: Leigh Elspeth SQUIBB, MD;  Location: [facility];  Service: Gastroenterology;  Laterality: N/A;    BRONCHIAL WASHINGS  06/13/2020   Procedure: BRONCHIAL WASHINGS;  Surgeon: Shelah Lamar RAMAN, MD;  Location: [facility];  Service: Pulmonary;;   CERVICAL FUSION     FINE NEEDLE ASPIRATION  06/13/2020   Procedure: FINE NEEDLE ASPIRATION (FNA) LINEAR;  Surgeon: Shelah Lamar RAMAN, MD;  Location: [facility];  Service: Pulmonary;;   IR FL GUIDED LOC OF NEEDLE/CATH TIP FOR SPINAL INJECTION LT     has had at least 10 of these   LAMINECTOMY AND MICRODISCECTOMY LUMBAR SPINE     SPINAL CORD STIMULATOR IMPLANT  ?2018   SPINAL FUSION     SPINE SURGERY N/A    Phreesia 12/05/2019   VIDEO BRONCHOSCOPY WITH ENDOBRONCHIAL ULTRASOUND Bilateral 06/13/2020   Procedure: VIDEO BRONCHOSCOPY WITH ENDOBRONCHIAL ULTRASOUND;  Surgeon: Shelah Lamar RAMAN, MD;  Location: [facility] ENDOSCOPY;  Service: Pulmonary;  Laterality: Bilateral;    Family History  Problem Relation Age of Onset   Depression Mother    Breast cancer Mother    Depression Father    Thyroid cancer Father    Depression Sister    Depression Brother    Hypertension Maternal Grandmother    Hyperlipidemia Maternal Grandmother    Heart disease Maternal Grandmother    Atrial fibrillation Maternal Grandmother    Diabetes Maternal Grandmother    Heart attack Maternal Grandfather     Social History   Socioeconomic History   Marital status: Married    Spouse name: Scientist, water quality   Number of children: 0   Years of education: Not on file   Highest education level: Some college, no degree  Occupational History   Occupation: disabled  Tobacco Use   Smoking status: Former    Current packs/day: 0.00    Average packs/day: 0.5 packs/day for 5.0 years (2.5 ttl pk-yrs)    Types: Cigarettes    Start date: 04/20/2009    Quit date: 04/20/2014    Years since quitting: 9.8   Smokeless tobacco: Never  Vaping Use   Vaping status: Never Used  Substance and Sexual Activity   Alcohol use: Yes    Alcohol/week: 2.0 standard drinks of alcohol    Types: 2 Standard  drinks or equivalent per week    Comment: 1 beer a week   Drug use: Never   Sexual activity: Not on file  Other Topics Concern   Not on file  Social History Narrative   Right Handed    Lives in a two story home    Social Drivers of Health   Financial Resource Strain: Medium Risk (12/23/2023)   Overall Financial Resource Strain (CARDIA)    Difficulty of Paying Living Expenses: Somewhat hard  Food Insecurity: No Food Insecurity (12/23/2023)   Hunger Vital Sign    Worried About Running Out of Food in the Last Year: Never true  Ran Out of Food in the Last Year: Never true  Transportation Needs: Unmet Transportation Needs (12/23/2023)   PRAPARE - Transportation    Lack of Transportation (Medical): No    Lack of Transportation (Non-Medical): Yes  Physical Activity: Inactive (12/23/2023)   Exercise Vital Sign    Days of Exercise per Week: 0 days    Minutes of Exercise per Session: Not on file  Stress: Stress Concern Present (12/23/2023)   [employer] of Occupational Health - Occupational Stress Questionnaire    Feeling of Stress: To some extent  Social Connections: Socially Isolated (12/23/2023)   Social Connection and Isolation Panel    Frequency of Communication with Friends and Family: Once a week    Frequency of Social Gatherings with Friends and Family: Once a week    Attends Religious Services: Never    Database administrator or Organizations: No    Attends Engineer, structural: Not on file    Marital Status: Married  Catering manager Violence: Not At Risk (09/07/2021)   Humiliation, Afraid, Rape, and Kick questionnaire    Fear of Current or Ex-Partner: No    Emotionally Abused: No    Physically Abused: No    Sexually Abused: No    Review of Systems  All other systems reviewed and are negative.       Objective    BP 115/74   Pulse 75   Ht 5' 10 (1.778 m)   Wt 203 lb (92.1 kg)   SpO2 96%   BMI 29.13 kg/m   Physical Exam Vitals and nursing note  reviewed.  Constitutional:      General: He is not in acute distress. HENT:     Mouth/Throat:     Mouth: Mucous membranes are moist.     Pharynx: Oropharynx is clear.  Cardiovascular:     Rate and Rhythm: Normal rate and regular rhythm.  Pulmonary:     Effort: Pulmonary effort is normal.     Breath sounds: Normal breath sounds.  Neurological:     General: No focal deficit present.     Mental Status: He is alert and oriented to person, place, and time.  Psychiatric:        Mood and Affect: Mood normal.        Behavior: Behavior normal.         Assessment & Plan:   Food allergy  Depression, unspecified depression type -     Escitalopram Oxalate; Take 1 tablet (20 mg total) by mouth daily.  Dispense: 90 tablet; Refill: 1  Other orders -     EPINEPHrine; Inject 0.3 mg into the muscle as needed for anaphylaxis.  Dispense: 1 each; Refill: 1     Return in about 6 months (around 08/18/2024).   Tanda Raguel SQUIBB, MD

## 2024-02-25 ENCOUNTER — Other Ambulatory Visit (HOSPITAL_COMMUNITY): Payer: Self-pay

## 2024-02-25 DIAGNOSIS — M542 Cervicalgia: Secondary | ICD-10-CM

## 2024-02-25 DIAGNOSIS — Z981 Arthrodesis status: Secondary | ICD-10-CM

## 2024-02-29 ENCOUNTER — Encounter (HOSPITAL_COMMUNITY): Payer: Self-pay

## 2024-03-01 ENCOUNTER — Ambulatory Visit (HOSPITAL_COMMUNITY)
Admission: RE | Admit: 2024-03-01 | Discharge: 2024-03-01 | Disposition: A | Discharge status: Home / Self Care | Source: Ambulatory Visit

## 2024-03-01 DIAGNOSIS — Z981 Arthrodesis status: Secondary | ICD-10-CM | POA: Diagnosis present

## 2024-03-01 DIAGNOSIS — M542 Cervicalgia: Secondary | ICD-10-CM

## 2024-04-01 ENCOUNTER — Ambulatory Visit

## 2024-04-09 ENCOUNTER — Encounter: Payer: Self-pay | Admitting: Internal Medicine

## 2024-04-09 ENCOUNTER — Other Ambulatory Visit

## 2024-04-09 ENCOUNTER — Ambulatory Visit: Admitting: Internal Medicine

## 2024-04-09 VITALS — BP 120/82 | HR 93 | Ht 70.0 in | Wt 203.0 lb

## 2024-04-09 DIAGNOSIS — E0590 Thyrotoxicosis, unspecified without thyrotoxic crisis or storm: Secondary | ICD-10-CM | POA: Diagnosis not present

## 2024-04-09 DIAGNOSIS — E0500 Thyrotoxicosis with diffuse goiter without thyrotoxic crisis or storm: Secondary | ICD-10-CM

## 2024-04-09 LAB — COMPREHENSIVE METABOLIC PANEL WITH GFR
AG Ratio: 2 (calc) (ref 1.0–2.5)
ALT: 15 U/L (ref 9–46)
AST: 17 U/L (ref 10–40)
Albumin: 4.7 g/dL (ref 3.6–5.1)
Alkaline phosphatase (APISO): 40 U/L (ref 36–130)
BUN: 19 mg/dL (ref 7–25)
CO2: 25 mmol/L (ref 20–32)
Calcium: 9.7 mg/dL (ref 8.6–10.3)
Chloride: 105 mmol/L (ref 98–110)
Creat: 0.79 mg/dL (ref 0.60–1.26)
Globulin: 2.3 g/dL (ref 1.9–3.7)
Glucose, Bld: 94 mg/dL (ref 65–99)
Potassium: 4.4 mmol/L (ref 3.5–5.3)
Sodium: 138 mmol/L (ref 135–146)
Total Bilirubin: 0.7 mg/dL (ref 0.2–1.2)
Total Protein: 7 g/dL (ref 6.1–8.1)
eGFR: 117 mL/min/1.73m2 (ref >=60)

## 2024-04-09 LAB — CBC
HCT: 44.7 % (ref 38.5–50.0)
Hemoglobin: 14.5 g/dL (ref 13.2–17.1)
MCH: 27.9 pg (ref 27.0–33.0)
MCHC: 32.4 g/dL (ref 32.0–36.0)
MCV: 86.1 fL (ref 80.0–100.0)
MPV: 11.7 fL (ref 7.5–12.5)
Platelets: 228 Thousand/uL (ref 140–400)
RBC: 5.19 Million/uL (ref 4.20–5.80)
RDW: 14.1 % (ref 11.0–15.0)
WBC: 5.5 Thousand/uL (ref 3.8–10.8)

## 2024-04-09 LAB — T4, FREE: Free T4: 1.2 ng/dL (ref 0.8–1.8)

## 2024-04-09 LAB — TSH: TSH: 0.8 m[IU]/L (ref 0.40–4.50)

## 2024-04-09 NOTE — Progress Notes (Signed)
 Name: Lawrence Cantu  MRN/ DOB: [id number], 1985/08/09    Age/ Sex: 38 y.o., male     PCP: Tanda Bleacher, MD   Reason for Endocrinology Evaluation: Hyperthyroidism     Initial Endocrinology Clinic Visit: 07/18/2021    PATIENT IDENTIFIER: [name] is a 38 y.o., male with a past medical history of hyperthyroidism, ankylosing spondylosis and sarcoidosis  . He has followed with [facility] since 07/18/2021 for consultative assistance with management of his Hyperthyroidism.   HISTORICAL SUMMARY: The patient was first diagnosed with hyperthyroidism in 2020.  He was started on methimazole  at the time   Thyroid  ultrasound 06/08/2019 showed mild diffuse heterogeneity of the thyroid  gland without discrete nodules  This has been attributes to Graves disease  Father with thyroid  cancer . Two  maternal aunts with hypothyroidism    Patient transferred care from Dr. Kassie in September 2023  SUBJECTIVE:    Today (04/09/2024):  Mr. [name] is here for on the management of hyperthyroidism.   Patient continues to follow-up with [facility] neurosurgery due to history of C1-C7 fusion in August 2024.  Patient presented with recent pain, this is thought to be inflammatory in nature. Probably neck stimulator  Continues to follow-up with pulmonary for sarcoidosis of the lung as well as OSA, he has not been using CPAP since 39 LB's weight loss He continues to follow-up with [facility] rheumatology for ankylosing spondylitis   Weight has been stable  No local neck swelling  Had one episode of palpitation ~ 5 days ago , lasted few seconds NO changes in bowel movements  No tremors  He is on Renflexis  for ankylosing spondylitis    Methimazole  5 mg, 1 tablet Monday through Thursday  HISTORY:  Past Medical History:  Past Medical History:  Diagnosis Date   Ankylosing spondylitis (HCC)    Anxiety    Arthritis    Phreesia 12/05/2019   BPH (benign prostatic hyperplasia)    Cauda  equina syndrome (HCC)    Chronic pain    DDD (degenerative disc disease), cervical    DDD (degenerative disc disease), lumbar    Depression    Phreesia 12/05/2019   Enlarged prostate    Hyperlipidemia    Hypertension    pt denies   Neuromuscular disorder (HCC)    Phreesia 12/05/2019   Pneumonia    x 2 as a child, 1 x as an adult   Post laminectomy syndrome    Sarcoidosis    Spinal stenosis    Past Surgical History:  Past Surgical History:  Procedure Laterality Date   ANAL RECTAL MANOMETRY N/A 01/17/2021   Procedure: ANO RECTAL MANOMETRY;  Surgeon: Leigh Elspeth SQUIBB, MD;  Location: [facility];  Service: Gastroenterology;  Laterality: N/A;   BRONCHIAL WASHINGS  06/13/2020   Procedure: BRONCHIAL WASHINGS;  Surgeon: Shelah Lamar RAMAN, MD;  Location: [facility];  Service: Pulmonary;;   CERVICAL FUSION     FINE NEEDLE ASPIRATION  06/13/2020   Procedure: FINE NEEDLE ASPIRATION (FNA) LINEAR;  Surgeon: Shelah Lamar RAMAN, MD;  Location: [facility];  Service: Pulmonary;;   IR FL GUIDED LOC OF NEEDLE/CATH TIP FOR SPINAL INJECTION LT     has had at least 10 of these   LAMINECTOMY AND MICRODISCECTOMY LUMBAR SPINE     SPINAL CORD STIMULATOR IMPLANT  ?2018   SPINAL FUSION     SPINE SURGERY N/A    Phreesia 12/05/2019   VIDEO BRONCHOSCOPY WITH ENDOBRONCHIAL ULTRASOUND Bilateral 06/13/2020   Procedure: VIDEO BRONCHOSCOPY  WITH ENDOBRONCHIAL ULTRASOUND;  Surgeon: Shelah Lamar RAMAN, MD;  Location: [facility];  Service: Pulmonary;  Laterality: Bilateral;   Social History:  reports that he quit smoking about 9 years ago. His smoking use included cigarettes. He started smoking about 14 years ago. He has a 2.5 pack-year smoking history. He has never used smokeless tobacco. He reports current alcohol use of about 2.0 standard drinks of alcohol per week. He reports that he does not use drugs. Family History:  Family History  Problem Relation Age of Onset   Depression Mother    Breast cancer  Mother    Depression Father    Thyroid  cancer Father    Depression Sister    Depression Brother    Hypertension Maternal Grandmother    Hyperlipidemia Maternal Grandmother    Heart disease Maternal Grandmother    Atrial fibrillation Maternal Grandmother    Diabetes Maternal Grandmother    Heart attack Maternal Grandfather      HOME MEDICATIONS: Allergies as of 04/09/2024       Reactions   Strawberry (diagnostic) Anaphylaxis   Other Hives   Cat Dander   Prednisone  Other (See Comments)   Skin crawling and leg pain, even in small doses        Medication List        Accurate as of April 09, 2024  1:08 PM. If you have any questions, ask your nurse or doctor.          albuterol  108 (90 Base) MCG/ACT inhaler Commonly known as: VENTOLIN  HFA Inhale 1-2 puffs into the lungs every 6 (six) hours as needed.   AMBULATORY NON FORMULARY MEDICATION Diltiazem gel with 2% lidocaine   Apply a pea sized amount into your rectum three times daily for 4 weeks   celecoxib 200 MG capsule Commonly known as: CELEBREX Take 100 mg by mouth daily.   EPINEPHrine  0.3 mg/0.3 mL Soaj injection Commonly known as: EPI-PEN Inject 0.3 mg into the muscle as needed for anaphylaxis.   escitalopram  20 MG tablet Commonly known as: LEXAPRO  Take 1 tablet (20 mg total) by mouth daily.   fexofenadine 60 MG tablet Commonly known as: ALLEGRA Take 60 mg by mouth daily as needed for allergies.   folic acid  1 MG tablet Commonly known as: FOLVITE  TAKE 1 TABLET BY MOUTH EVERY DAY   gabapentin  600 MG tablet Commonly known as: NEURONTIN  Take 2 tablets by mouth 3 (three) times daily.   Melatonin 10 MG Tabs Take 10 mg by mouth at bedtime.   methimazole  5 MG tablet Commonly known as: TAPAZOLE  1 tablet monday through Thursday only none the rest of the week   methotrexate 2.5 MG tablet Commonly known as: RHEUMATREX Take 20 mg by mouth once a week.   morphine  15 MG 12 hr tablet Commonly known as: MS  CONTIN Take 15 mg by mouth 2 (two) times daily.   oxyCODONE  5 MG immediate release tablet Commonly known as: Oxy IR/ROXICODONE  Take 5 mg by mouth in the morning, at noon, and at bedtime.   Renflexis 100 MG Solr Generic drug: inFLIXimab-abda Once every 4 weeks infusion   tamsulosin  0.4 MG Caps capsule Commonly known as: FLOMAX  TAKE 1 CAPSULE BY MOUTH EVERYDAY AT BEDTIME   tiZANidine  2 MG tablet Commonly known as: ZANAFLEX  Take 1 tablet (2 mg total) by mouth 3 (three) times daily as needed for muscle spasms.          OBJECTIVE:   PHYSICAL EXAM: VS: BP 120/82 (BP Location: Left Arm,  Patient Position: Sitting, Cuff Size: Large)   Pulse 93   Ht 5' 10 (1.778 m)   Wt 203 lb (92.1 kg)   SpO2 96%   BMI 29.13 kg/m    EXAM: General: Pt appears well and is in NAD  Neck: General: Supple without adenopathy. Thyroid : Thyroid  size normal.  No goiter or nodules appreciated.   Lungs: Clear with good BS bilat   Heart: Auscultation: RRR.  Extremities:  BL LE: No pretibial edema   Mental Status: Judgment, insight: Intact Orientation: Oriented to time, place, and person Mood and affect: No depression, anxiety, or agitation     DATA REVIEWED:   Latest Reference Range & Units 04/09/24 13:26  Sodium 135 - 146 mmol/L 138  Potassium 3.5 - 5.3 mmol/L 4.4  Chloride 98 - 110 mmol/L 105  CO2 20 - 32 mmol/L 25  Glucose 65 - 99 mg/dL 94  BUN 7 - 25 mg/dL 19  Creatinine 9.39 - 8.73 mg/dL 9.20  Calcium 8.6 - 89.6 mg/dL 9.7  BUN/Creatinine Ratio 6 - 22 (calc) SEE NOTE:  eGFR > OR = 60 mL/min/1.49m2 117  AG Ratio 1.0 - 2.5 (calc) 2.0  AST 10 - 40 U/L 17  ALT 9 - 46 U/L 15  Total Protein 6.1 - 8.1 g/dL 7.0  Total Bilirubin 0.2 - 1.2 mg/dL 0.7    Latest Reference Range & Units 04/09/24 13:26  Alkaline phosphatase (APISO) 36 - 130 U/L 40  Globulin 1.9 - 3.7 g/dL (calc) 2.3  WBC 3.8 - 89.1 Thousand/uL 5.5  RBC 4.20 - 5.80 Million/uL 5.19  Hemoglobin 13.2 - 17.1 g/dL 85.4  HCT 61.4  - 49.9 % 44.7  MCV 80.0 - 100.0 fL 86.1  MCH 27.0 - 33.0 pg 27.9  MCHC 32.0 - 36.0 g/dL 67.5  RDW 88.9 - 84.9 % 14.1  Platelets 140 - 400 Thousand/uL 228  MPV 7.5 - 12.5 fL 11.7    Latest Reference Range & Units 04/09/24 13:26  Glucose 65 - 99 mg/dL 94  TSH 9.59 - 5.49 mIU/L 0.80  T4,Free(Direct) 0.8 - 1.8 ng/dL 1.2  Albumin MSPROF 3.6 - 5.1 g/dL 4.7     ASSESSMENT / PLAN / RECOMMENDATIONS:   Hyperthyroidism:  -Clinical scenario consistent with Graves' disease -Ultrasound of the thyroid  in the past did not reveal any nodules -TFTs remain within normal range as well as CMP and CBC - No change  Medications   Methimazole  5 mg, 1 tablet Monday through Thursday  , none on Friday, Saturdays or Sundays     2. Graves' Disease:  -No extrathyroidal manifestation of Graves' disease   F/U in 6 months     Signed electronically by: Stefano Redgie Butts, MD  [facility]  [facility] [address] [place], [place] [address] Phone: [phone] FAX: [phone]      CC: Tanda Bleacher, MD [address] [place] [address] Phone: [phone]  Fax: [phone]   Return to Endocrinology clinic as below: Future Appointments  Date Time Provider Department Center  04/29/2024  3:40 PM PCE-ANNUAL WELLNESS VISIT [facility] [address]  08/18/2024  2:40 PM Tanda Bleacher, MD [facility] [address]

## 2024-04-12 ENCOUNTER — Ambulatory Visit: Payer: Self-pay | Admitting: Internal Medicine

## 2024-04-12 MED ORDER — METHIMAZOLE 5 MG PO TABS: ORAL_TABLET | ORAL | 3 refills | Status: AC

## 2024-04-29 ENCOUNTER — Ambulatory Visit

## 2024-07-02 ENCOUNTER — Other Ambulatory Visit: Payer: Self-pay | Admitting: Family Medicine

## 2024-07-02 DIAGNOSIS — S343XXS Injury of cauda equina, sequela: Secondary | ICD-10-CM

## 2024-08-18 ENCOUNTER — Ambulatory Visit: Admitting: Family Medicine

## 2024-10-12 ENCOUNTER — Ambulatory Visit: Admitting: Internal Medicine
